# Patient Record
Sex: Male | Born: 1940 | Race: Black or African American | Hispanic: No | Smoking: Never smoker
Health system: Southern US, Community
[De-identification: ages and names within clinical notes are randomized; demographics above are authoritative.]

## PROBLEM LIST (undated history)

## (undated) DIAGNOSIS — E785 Hyperlipidemia, unspecified: Secondary | ICD-10-CM

## (undated) DIAGNOSIS — M810 Age-related osteoporosis without current pathological fracture: Secondary | ICD-10-CM

## (undated) DIAGNOSIS — E119 Type 2 diabetes mellitus without complications: Secondary | ICD-10-CM

## (undated) DIAGNOSIS — N529 Male erectile dysfunction, unspecified: Secondary | ICD-10-CM

## (undated) DIAGNOSIS — C61 Malignant neoplasm of prostate: Secondary | ICD-10-CM

## (undated) DIAGNOSIS — Z87442 Personal history of urinary calculi: Secondary | ICD-10-CM

## (undated) DIAGNOSIS — H9190 Unspecified hearing loss, unspecified ear: Secondary | ICD-10-CM

## (undated) DIAGNOSIS — F0390 Unspecified dementia without behavioral disturbance: Secondary | ICD-10-CM

## (undated) DIAGNOSIS — Z8719 Personal history of other diseases of the digestive system: Secondary | ICD-10-CM

## (undated) DIAGNOSIS — F32A Depression, unspecified: Secondary | ICD-10-CM

---

## 2020-05-27 ENCOUNTER — Ambulatory Visit: Payer: Self-pay | Admitting: Podiatry

## 2020-05-30 ENCOUNTER — Ambulatory Visit: Payer: Self-pay | Admitting: Podiatry

## 2020-06-03 ENCOUNTER — Emergency Department: Payer: Medicare (Managed Care)

## 2020-06-03 ENCOUNTER — Inpatient Hospital Stay: Payer: Medicare (Managed Care)

## 2020-06-03 ENCOUNTER — Inpatient Hospital Stay: Payer: Medicare (Managed Care) | Admitting: Certified Registered"

## 2020-06-03 ENCOUNTER — Encounter: Payer: Self-pay | Admitting: Emergency Medicine

## 2020-06-03 ENCOUNTER — Inpatient Hospital Stay
Admission: EM | Admit: 2020-06-03 | Discharge: 2020-06-20 | DRG: 472 | Disposition: A | Discharge status: Skilled Nursing Facility | Payer: Medicare (Managed Care) | Attending: Hospitalist | Admitting: Hospitalist

## 2020-06-03 ENCOUNTER — Other Ambulatory Visit: Payer: Self-pay

## 2020-06-03 ENCOUNTER — Encounter
Admission: EM | Disposition: A | Discharge status: Skilled Nursing Facility | Payer: Self-pay | Source: Home / Self Care | Attending: Internal Medicine

## 2020-06-03 DIAGNOSIS — F0390 Unspecified dementia without behavioral disturbance: Secondary | ICD-10-CM | POA: Diagnosis present

## 2020-06-03 DIAGNOSIS — S0081XA Abrasion of other part of head, initial encounter: Secondary | ICD-10-CM | POA: Diagnosis present

## 2020-06-03 DIAGNOSIS — Z833 Family history of diabetes mellitus: Secondary | ICD-10-CM

## 2020-06-03 DIAGNOSIS — Y92019 Unspecified place in single-family (private) house as the place of occurrence of the external cause: Secondary | ICD-10-CM

## 2020-06-03 DIAGNOSIS — F05 Delirium due to known physiological condition: Secondary | ICD-10-CM | POA: Diagnosis not present

## 2020-06-03 DIAGNOSIS — F32A Depression, unspecified: Secondary | ICD-10-CM | POA: Diagnosis present

## 2020-06-03 DIAGNOSIS — W19XXXA Unspecified fall, initial encounter: Secondary | ICD-10-CM | POA: Diagnosis not present

## 2020-06-03 DIAGNOSIS — Z8249 Family history of ischemic heart disease and other diseases of the circulatory system: Secondary | ICD-10-CM

## 2020-06-03 DIAGNOSIS — S199XXA Unspecified injury of neck, initial encounter: Secondary | ICD-10-CM | POA: Diagnosis present

## 2020-06-03 DIAGNOSIS — E876 Hypokalemia: Secondary | ICD-10-CM | POA: Diagnosis present

## 2020-06-03 DIAGNOSIS — N400 Enlarged prostate without lower urinary tract symptoms: Secondary | ICD-10-CM | POA: Diagnosis present

## 2020-06-03 DIAGNOSIS — Z419 Encounter for procedure for purposes other than remedying health state, unspecified: Secondary | ICD-10-CM

## 2020-06-03 DIAGNOSIS — Z79899 Other long term (current) drug therapy: Secondary | ICD-10-CM

## 2020-06-03 DIAGNOSIS — H9190 Unspecified hearing loss, unspecified ear: Secondary | ICD-10-CM | POA: Diagnosis present

## 2020-06-03 DIAGNOSIS — M2578 Osteophyte, vertebrae: Secondary | ICD-10-CM | POA: Diagnosis present

## 2020-06-03 DIAGNOSIS — Z794 Long term (current) use of insulin: Secondary | ICD-10-CM

## 2020-06-03 DIAGNOSIS — W109XXA Fall (on) (from) unspecified stairs and steps, initial encounter: Secondary | ICD-10-CM | POA: Diagnosis present

## 2020-06-03 DIAGNOSIS — M7918 Myalgia, other site: Secondary | ICD-10-CM

## 2020-06-03 DIAGNOSIS — Z8546 Personal history of malignant neoplasm of prostate: Secondary | ICD-10-CM | POA: Diagnosis not present

## 2020-06-03 DIAGNOSIS — R200 Anesthesia of skin: Secondary | ICD-10-CM

## 2020-06-03 DIAGNOSIS — F03918 Unspecified dementia, unspecified severity, with other behavioral disturbance: Secondary | ICD-10-CM | POA: Insufficient documentation

## 2020-06-03 DIAGNOSIS — Z20822 Contact with and (suspected) exposure to covid-19: Secondary | ICD-10-CM | POA: Diagnosis present

## 2020-06-03 DIAGNOSIS — S129XXA Fracture of neck, unspecified, initial encounter: Secondary | ICD-10-CM

## 2020-06-03 DIAGNOSIS — S0083XA Contusion of other part of head, initial encounter: Secondary | ICD-10-CM

## 2020-06-03 DIAGNOSIS — E119 Type 2 diabetes mellitus without complications: Secondary | ICD-10-CM

## 2020-06-03 DIAGNOSIS — M810 Age-related osteoporosis without current pathological fracture: Secondary | ICD-10-CM | POA: Diagnosis present

## 2020-06-03 DIAGNOSIS — E1165 Type 2 diabetes mellitus with hyperglycemia: Secondary | ICD-10-CM | POA: Diagnosis not present

## 2020-06-03 DIAGNOSIS — R410 Disorientation, unspecified: Secondary | ICD-10-CM | POA: Diagnosis not present

## 2020-06-03 DIAGNOSIS — E785 Hyperlipidemia, unspecified: Secondary | ICD-10-CM

## 2020-06-03 DIAGNOSIS — S12501A Unspecified nondisplaced fracture of sixth cervical vertebra, initial encounter for closed fracture: Secondary | ICD-10-CM | POA: Diagnosis not present

## 2020-06-03 DIAGNOSIS — S12500A Unspecified displaced fracture of sixth cervical vertebra, initial encounter for closed fracture: Principal | ICD-10-CM | POA: Diagnosis present

## 2020-06-03 DIAGNOSIS — Z981 Arthrodesis status: Secondary | ICD-10-CM

## 2020-06-03 DIAGNOSIS — F0391 Unspecified dementia with behavioral disturbance: Secondary | ICD-10-CM | POA: Insufficient documentation

## 2020-06-03 HISTORY — DX: Personal history of urinary calculi: Z87.442

## 2020-06-03 HISTORY — DX: Hyperlipidemia, unspecified: E78.5

## 2020-06-03 HISTORY — DX: Depression, unspecified: F32.A

## 2020-06-03 HISTORY — DX: Unspecified dementia, unspecified severity, without behavioral disturbance, psychotic disturbance, mood disturbance, and anxiety: F03.90

## 2020-06-03 HISTORY — DX: Personal history of other diseases of the digestive system: Z87.19

## 2020-06-03 HISTORY — DX: Malignant neoplasm of prostate: C61

## 2020-06-03 HISTORY — PX: ANTERIOR CERVICAL DECOMP/DISCECTOMY FUSION: SHX1161

## 2020-06-03 HISTORY — DX: Type 2 diabetes mellitus without complications: E11.9

## 2020-06-03 HISTORY — DX: Male erectile dysfunction, unspecified: N52.9

## 2020-06-03 HISTORY — DX: Age-related osteoporosis without current pathological fracture: M81.0

## 2020-06-03 HISTORY — DX: Unspecified hearing loss, unspecified ear: H91.90

## 2020-06-03 LAB — CBC
HCT: 42.2 % (ref 39.0–52.0)
Hemoglobin: 14.1 g/dL (ref 13.0–17.0)
MCH: 29.4 pg (ref 26.0–34.0)
MCHC: 33.4 g/dL (ref 30.0–36.0)
MCV: 88.1 fL (ref 80.0–100.0)
Platelets: 158 10*3/uL (ref 150–400)
RBC: 4.79 MIL/uL (ref 4.22–5.81)
RDW: 12.9 % (ref 11.5–15.5)
WBC: 11.1 10*3/uL — ABNORMAL HIGH (ref 4.0–10.5)
nRBC: 0 % (ref 0.0–0.2)

## 2020-06-03 LAB — BASIC METABOLIC PANEL
Anion gap: 14 (ref 5–15)
Anion gap: 14 (ref 5–15)
BUN: 27 mg/dL — ABNORMAL HIGH (ref 8–23)
BUN: 15 mg/dL (ref 8–23)
CO2: 20 mmol/L — ABNORMAL LOW (ref 22–32)
CO2: 19 mmol/L — ABNORMAL LOW (ref 22–32)
Calcium: 9 mg/dL (ref 8.9–10.3)
Calcium: 8.2 mg/dL — ABNORMAL LOW (ref 8.9–10.3)
Chloride: 105 mmol/L (ref 98–111)
Chloride: 105 mmol/L (ref 98–111)
Creatinine, Ser: 1.17 mg/dL (ref 0.61–1.24)
Creatinine, Ser: 0.85 mg/dL (ref 0.61–1.24)
GFR, Estimated: 59 mL/min — ABNORMAL LOW (ref >60)
GFR, Estimated: >60 mL/min (ref >60)
Glucose, Bld: 148 mg/dL — ABNORMAL HIGH (ref 70–99)
Glucose, Bld: 151 mg/dL — ABNORMAL HIGH (ref 70–99)
Potassium: 3.4 mmol/L — ABNORMAL LOW (ref 3.5–5.1)
Potassium: 3.8 mmol/L (ref 3.5–5.1)
Sodium: 139 mmol/L (ref 135–145)
Sodium: 138 mmol/L (ref 135–145)

## 2020-06-03 LAB — BLOOD GAS, ARTERIAL
Acid-base deficit: 4.8 mmol/L — ABNORMAL HIGH (ref 0.0–2.0)
Bicarbonate: 19.7 mmol/L — ABNORMAL LOW (ref 20.0–28.0)
FIO2: 0.28
O2 Saturation: 97.9 %
Patient temperature: 37
pCO2 arterial: 34 mmHg (ref 32.0–48.0)
pH, Arterial: 7.37 (ref 7.350–7.450)
pO2, Arterial: 105 mmHg (ref 83.0–108.0)

## 2020-06-03 LAB — CBC WITH DIFFERENTIAL/PLATELET
Abs Immature Granulocytes: 0.09 10*3/uL — ABNORMAL HIGH (ref 0.00–0.07)
Basophils Absolute: 0 10*3/uL (ref 0.0–0.1)
Basophils Relative: 0 %
Eosinophils Absolute: 0 10*3/uL (ref 0.0–0.5)
Eosinophils Relative: 0 %
HCT: 43.9 % (ref 39.0–52.0)
Hemoglobin: 14.7 g/dL (ref 13.0–17.0)
Immature Granulocytes: 1 %
Lymphocytes Relative: 8 %
Lymphs Abs: 1 10*3/uL (ref 0.7–4.0)
MCH: 29.4 pg (ref 26.0–34.0)
MCHC: 33.5 g/dL (ref 30.0–36.0)
MCV: 87.8 fL (ref 80.0–100.0)
Monocytes Absolute: 0.6 10*3/uL (ref 0.1–1.0)
Monocytes Relative: 5 %
Neutro Abs: 10.4 10*3/uL — ABNORMAL HIGH (ref 1.7–7.7)
Neutrophils Relative %: 86 %
Platelets: 138 10*3/uL — ABNORMAL LOW (ref 150–400)
RBC: 5 MIL/uL (ref 4.22–5.81)
RDW: 12.9 % (ref 11.5–15.5)
WBC: 12.1 10*3/uL — ABNORMAL HIGH (ref 4.0–10.5)
nRBC: 0 % (ref 0.0–0.2)

## 2020-06-03 LAB — PROTIME-INR
INR: 1 (ref 0.8–1.2)
Prothrombin Time: 12.7 seconds (ref 11.4–15.2)

## 2020-06-03 LAB — GLUCOSE, CAPILLARY
Glucose-Capillary: 146 mg/dL — ABNORMAL HIGH (ref 70–99)
Glucose-Capillary: 113 mg/dL — ABNORMAL HIGH (ref 70–99)
Glucose-Capillary: 124 mg/dL — ABNORMAL HIGH (ref 70–99)

## 2020-06-03 LAB — RESPIRATORY PANEL BY RT PCR (FLU A&B, COVID)
Influenza A by PCR: NEGATIVE
Influenza B by PCR: NEGATIVE
SARS Coronavirus 2 by RT PCR: NEGATIVE

## 2020-06-03 SURGERY — ANTERIOR CERVICAL DECOMPRESSION/DISCECTOMY FUSION 1 LEVEL
Anesthesia: General

## 2020-06-03 MED ORDER — ONDANSETRON HCL 4 MG PO TABS: 4.0000 mg | ORAL_TABLET | Freq: Four times a day (QID) | ORAL | Status: DC | PRN

## 2020-06-03 MED ORDER — FENTANYL CITRATE (PF) 100 MCG/2ML IJ SOLN: 25.0000 ug | INTRAMUSCULAR | Status: DC | PRN

## 2020-06-03 MED ORDER — MORPHINE SULFATE (PF) 2 MG/ML IV SOLN
2.0000 mg | INTRAVENOUS | Status: DC | PRN
  Filled 2020-06-03: qty 1

## 2020-06-03 MED ORDER — OXYCODONE HCL 5 MG/5ML PO SOLN: 5.0000 mg | Freq: Once | ORAL | Status: DC | PRN

## 2020-06-03 MED ORDER — QUETIAPINE FUMARATE 25 MG PO TABS
25.0000 mg | ORAL_TABLET | Freq: Every day | ORAL | Status: DC
  Administered 2020-06-04 – 2020-06-19 (×16): 25 mg via ORAL
  Filled 2020-06-03 (×16): qty 1

## 2020-06-03 MED ORDER — CHLORHEXIDINE GLUCONATE CLOTH 2 % EX PADS
6.0000 | MEDICATED_PAD | Freq: Every day | CUTANEOUS | Status: DC
  Administered 2020-06-06 – 2020-06-20 (×10): 6 via TOPICAL

## 2020-06-03 MED ORDER — TRAZODONE HCL 50 MG PO TABS
25.0000 mg | ORAL_TABLET | Freq: Every evening | ORAL | Status: DC | PRN
  Administered 2020-06-04: 25 mg via ORAL
  Filled 2020-06-03: qty 1

## 2020-06-03 MED ORDER — LIDOCAINE HCL (CARDIAC) PF 100 MG/5ML IV SOSY
PREFILLED_SYRINGE | INTRAVENOUS | Status: DC | PRN
  Administered 2020-06-03: 60 mg via INTRAVENOUS

## 2020-06-03 MED ORDER — SODIUM CHLORIDE 0.9 % IV SOLN
INTRAVENOUS | Status: DC | PRN
  Administered 2020-06-03: 50 ug/min via INTRAVENOUS

## 2020-06-03 MED ORDER — HYDROMORPHONE HCL 1 MG/ML IJ SOLN: 0.5000 mg | INTRAMUSCULAR | Status: DC | PRN

## 2020-06-03 MED ORDER — PROPOFOL 500 MG/50ML IV EMUL
INTRAVENOUS | Status: DC | PRN
  Administered 2020-06-03: 50 ug/kg/min via INTRAVENOUS

## 2020-06-03 MED ORDER — PRAVASTATIN SODIUM 20 MG PO TABS
20.0000 mg | ORAL_TABLET | Freq: Every day | ORAL | Status: DC
  Administered 2020-06-04 – 2020-06-19 (×15): 20 mg via ORAL
  Filled 2020-06-03 (×16): qty 1

## 2020-06-03 MED ORDER — LIDOCAINE-EPINEPHRINE 1 %-1:100000 IJ SOLN
INTRAMUSCULAR | Status: DC | PRN
  Administered 2020-06-03: 4 mL

## 2020-06-03 MED ORDER — CEFAZOLIN SODIUM-DEXTROSE 2-3 GM-%(50ML) IV SOLR
INTRAVENOUS | Status: DC | PRN
  Administered 2020-06-03: 2 g via INTRAVENOUS

## 2020-06-03 MED ORDER — FENTANYL CITRATE (PF) 100 MCG/2ML IJ SOLN
INTRAMUSCULAR | Status: DC | PRN
  Administered 2020-06-03 (×2): 25 ug via INTRAVENOUS
  Administered 2020-06-03: 50 ug via INTRAVENOUS

## 2020-06-03 MED ORDER — SUCCINYLCHOLINE CHLORIDE 20 MG/ML IJ SOLN
INTRAMUSCULAR | Status: DC | PRN
  Administered 2020-06-03: 100 mg via INTRAVENOUS

## 2020-06-03 MED ORDER — PROPOFOL 10 MG/ML IV BOLUS
INTRAVENOUS | Status: DC | PRN
  Administered 2020-06-03: 70 mg via INTRAVENOUS

## 2020-06-03 MED ORDER — MAGNESIUM HYDROXIDE 400 MG/5ML PO SUSP
30.0000 mL | Freq: Every day | ORAL | Status: DC | PRN
  Administered 2020-06-12: 30 mL via ORAL
  Filled 2020-06-03 (×2): qty 30

## 2020-06-03 MED ORDER — ACETAMINOPHEN 650 MG RE SUPP: 650.0000 mg | Freq: Four times a day (QID) | RECTAL | Status: DC | PRN

## 2020-06-03 MED ORDER — METHOCARBAMOL 1000 MG/10ML IJ SOLN
500.0000 mg | Freq: Three times a day (TID) | INTRAVENOUS | Status: DC | PRN
  Administered 2020-06-04: 500 mg via INTRAVENOUS
  Filled 2020-06-03 (×2): qty 5

## 2020-06-03 MED ORDER — TAMSULOSIN HCL 0.4 MG PO CAPS
0.4000 mg | ORAL_CAPSULE | Freq: Every day | ORAL | Status: DC
  Administered 2020-06-04: 0.4 mg via ORAL
  Filled 2020-06-03: qty 1

## 2020-06-03 MED ORDER — REMIFENTANIL HCL 1 MG IV SOLR
INTRAVENOUS | Status: AC
  Filled 2020-06-03: qty 1000

## 2020-06-03 MED ORDER — NALOXONE HCL 2 MG/2ML IJ SOSY: 0.4000 mg | PREFILLED_SYRINGE | Freq: Once | INTRAMUSCULAR | Status: AC

## 2020-06-03 MED ORDER — OXYCODONE-ACETAMINOPHEN 5-325 MG PO TABS: 1.0000 | ORAL_TABLET | Freq: Once | ORAL | Status: DC

## 2020-06-03 MED ORDER — ONDANSETRON HCL 4 MG/2ML IJ SOLN
4.0000 mg | Freq: Four times a day (QID) | INTRAMUSCULAR | Status: DC | PRN
  Administered 2020-06-03: 4 mg via INTRAVENOUS
  Filled 2020-06-03: qty 2

## 2020-06-03 MED ORDER — ONDANSETRON HCL 4 MG/2ML IJ SOLN: 4.0000 mg | Freq: Once | INTRAMUSCULAR | Status: DC | PRN

## 2020-06-03 MED ORDER — INSULIN GLARGINE 100 UNIT/ML ~~LOC~~ SOLN
7.0000 [IU] | Freq: Every day | SUBCUTANEOUS | Status: DC
  Administered 2020-06-03 – 2020-06-06 (×4): 7 [IU] via SUBCUTANEOUS
  Filled 2020-06-03 (×6): qty 0.07

## 2020-06-03 MED ORDER — OXYCODONE HCL 5 MG PO TABS: 5.0000 mg | ORAL_TABLET | Freq: Once | ORAL | Status: DC | PRN

## 2020-06-03 MED ORDER — INSULIN ASPART 100 UNIT/ML ~~LOC~~ SOLN
0.0000 [IU] | SUBCUTANEOUS | Status: DC
  Administered 2020-06-04: 2 [IU] via SUBCUTANEOUS
  Administered 2020-06-04: 3 [IU] via SUBCUTANEOUS
  Administered 2020-06-04: 2 [IU] via SUBCUTANEOUS
  Administered 2020-06-05: 5 [IU] via SUBCUTANEOUS
  Administered 2020-06-05: 2 [IU] via SUBCUTANEOUS
  Administered 2020-06-05 (×2): 3 [IU] via SUBCUTANEOUS
  Administered 2020-06-06: 2 [IU] via SUBCUTANEOUS
  Administered 2020-06-06: 1 [IU] via SUBCUTANEOUS
  Administered 2020-06-06: 2 [IU] via SUBCUTANEOUS
  Administered 2020-06-06: 9 [IU] via SUBCUTANEOUS
  Filled 2020-06-03 (×9): qty 1

## 2020-06-03 MED ORDER — PROPOFOL 10 MG/ML IV BOLUS
INTRAVENOUS | Status: AC
  Filled 2020-06-03: qty 20

## 2020-06-03 MED ORDER — REMIFENTANIL HCL 1 MG IV SOLR
INTRAVENOUS | Status: DC | PRN
  Administered 2020-06-03: .07 ug/kg/min via INTRAVENOUS

## 2020-06-03 MED ORDER — ENOXAPARIN SODIUM 40 MG/0.4ML ~~LOC~~ SOLN
40.0000 mg | SUBCUTANEOUS | Status: DC
  Administered 2020-06-04 – 2020-06-19 (×16): 40 mg via SUBCUTANEOUS
  Filled 2020-06-03 (×17): qty 0.4

## 2020-06-03 MED ORDER — GELATIN ABSORBABLE 12-7 MM EX MISC
CUTANEOUS | Status: DC | PRN
  Administered 2020-06-03 (×2): 1

## 2020-06-03 MED ORDER — FENTANYL CITRATE (PF) 100 MCG/2ML IJ SOLN
INTRAMUSCULAR | Status: AC
  Filled 2020-06-03: qty 2

## 2020-06-03 MED ORDER — PROPOFOL 500 MG/50ML IV EMUL
INTRAVENOUS | Status: AC
  Filled 2020-06-03: qty 50

## 2020-06-03 MED ORDER — THROMBIN 5000 UNITS EX SOLR
CUTANEOUS | Status: DC | PRN
  Administered 2020-06-03: 5000 [IU] via TOPICAL

## 2020-06-03 MED ORDER — ACETAMINOPHEN 10 MG/ML IV SOLN
INTRAVENOUS | Status: AC
  Administered 2020-06-03: 1000 mg via INTRAVENOUS
  Filled 2020-06-03: qty 100

## 2020-06-03 MED ORDER — ACETAMINOPHEN 10 MG/ML IV SOLN
1000.0000 mg | Freq: Three times a day (TID) | INTRAVENOUS | Status: AC
  Administered 2020-06-04 (×2): 1000 mg via INTRAVENOUS
  Filled 2020-06-03 (×2): qty 100

## 2020-06-03 MED ORDER — DEXAMETHASONE SODIUM PHOSPHATE 10 MG/ML IJ SOLN
INTRAMUSCULAR | Status: DC | PRN
  Administered 2020-06-03: 10 mg via INTRAVENOUS

## 2020-06-03 MED ORDER — ACETAMINOPHEN 325 MG PO TABS: 650.0000 mg | ORAL_TABLET | Freq: Four times a day (QID) | ORAL | Status: DC | PRN

## 2020-06-03 MED ORDER — DROPERIDOL 2.5 MG/ML IJ SOLN
5.0000 mg | Freq: Once | INTRAMUSCULAR | Status: AC
  Administered 2020-06-03: 5 mg via INTRAVENOUS
  Filled 2020-06-03: qty 2

## 2020-06-03 MED ORDER — SODIUM CHLORIDE 0.9 % IV SOLN: INTRAVENOUS | Status: DC

## 2020-06-03 MED ORDER — NALOXONE HCL 2 MG/2ML IJ SOSY
PREFILLED_SYRINGE | INTRAMUSCULAR | Status: AC
  Administered 2020-06-03: 0.4 mg via INTRAVENOUS
  Filled 2020-06-03: qty 2

## 2020-06-03 SURGICAL SUPPLY — 64 items
ALLOGRAFT LORDOTIC CC 7X11X14 (Bone Implant) ×2 IMPLANT
BIT DRILL 13 (BIT) IMPLANT
BIT DRILL 13MM (BIT)
BIT DRILL SPINE QC 12 (BIT) ×2 IMPLANT
BLADE BOVIE TIP EXT 4 (BLADE) ×3 IMPLANT
BLADE SURG 15 STRL LF DISP TIS (BLADE) ×1 IMPLANT
BLADE SURG 15 STRL SS (BLADE) ×3
BUR DIAMOND COARSE 4.0 RND (BURR) ×1 IMPLANT
BUR NEURO DRILL SOFT 3.0X3.8M (BURR) ×3 IMPLANT
CANISTER SUCT 1200ML W/VALVE (MISCELLANEOUS) ×3 IMPLANT
CHLORAPREP W/TINT 26 (MISCELLANEOUS) ×6 IMPLANT
COUNTER NEEDLE 20/40 LG (NEEDLE) ×3 IMPLANT
COVER LIGHT HANDLE STERIS (MISCELLANEOUS) ×6 IMPLANT
COVER WAND RF STERILE (DRAPES) ×3 IMPLANT
CUP MEDICINE 2OZ PLAST GRAD ST (MISCELLANEOUS) ×6 IMPLANT
DERMABOND ADVANCED (GAUZE/BANDAGES/DRESSINGS) ×2
DERMABOND ADVANCED .7 DNX12 (GAUZE/BANDAGES/DRESSINGS) ×1 IMPLANT
DRAPE C-ARM 42X72 X-RAY (DRAPES) ×6 IMPLANT
DRAPE MICROSCOPE SPINE 48X150 (DRAPES) ×3 IMPLANT
DRAPE SURG 17X11 SM STRL (DRAPES) ×6 IMPLANT
DRAPE THYROID T SHEET (DRAPES) ×3 IMPLANT
ELECT CAUTERY BLADE TIP 2.5 (TIP) ×3
ELECT EZSTD 165MM 6.5IN (MISCELLANEOUS) ×3
ELECTRODE CAUTERY BLDE TIP 2.5 (TIP) ×1 IMPLANT
ELECTRODE EZSTD 165MM 6.5IN (MISCELLANEOUS) ×1 IMPLANT
FEE INTRAOP MONITOR IMPULS NCS (MISCELLANEOUS) IMPLANT
GAUZE SPONGE 4X4 12PLY STRL (GAUZE/BANDAGES/DRESSINGS) ×3 IMPLANT
GLOVE BIOGEL PI IND STRL 8 (GLOVE) ×1 IMPLANT
GLOVE BIOGEL PI INDICATOR 8 (GLOVE) ×2
GLOVE INDICATOR 7.0 STRL GRN (GLOVE) ×3 IMPLANT
GLOVE SURG SYN 7.0 (GLOVE) ×6 IMPLANT
GLOVE SURG SYN 7.0 PF PI (GLOVE) ×2 IMPLANT
GLOVE SURG SYN 8.0 (GLOVE) ×6 IMPLANT
GLOVE SURG SYN 8.0 PF PI (GLOVE) ×2 IMPLANT
GOWN STRL REUS W/ TWL XL LVL3 (GOWN DISPOSABLE) ×2 IMPLANT
GOWN STRL REUS W/TWL XL LVL3 (GOWN DISPOSABLE) ×6
GRADUATE 1200CC STRL 31836 (MISCELLANEOUS) ×3 IMPLANT
INTRAOP MONITOR FEE IMPULS NCS (MISCELLANEOUS) ×1
INTRAOP MONITOR FEE IMPULSE (MISCELLANEOUS) ×3
IV CATH ANGIO 12GX3 LT BLUE (NEEDLE) ×1 IMPLANT
KIT TURNOVER KIT A (KITS) ×3 IMPLANT
MARKER SKIN DUAL TIP RULER LAB (MISCELLANEOUS) ×6 IMPLANT
NDL SPNL 22GX3.5 QUINCKE BK (NEEDLE) ×1 IMPLANT
NEEDLE HYPO 22GX1.5 SAFETY (NEEDLE) ×3 IMPLANT
NEEDLE SPNL 22GX3.5 QUINCKE BK (NEEDLE) ×3 IMPLANT
NS IRRIG 1000ML POUR BTL (IV SOLUTION) ×3 IMPLANT
PACK LAMINECTOMY NEURO (CUSTOM PROCEDURE TRAY) ×3 IMPLANT
PAD ARMBOARD 7.5X6 YLW CONV (MISCELLANEOUS) ×3 IMPLANT
PIN CASPAR 14 (PIN) ×1 IMPLANT
PIN CASPAR 14MM (PIN) ×3 IMPLANT
PLATE ANT CERV XTEND 1 LV 14 (Plate) ×2 IMPLANT
SCREW XTD VAR 4.2 SELF TAP (Screw) ×8 IMPLANT
SPOGE SURGIFLO 8M (HEMOSTASIS) ×3
SPONGE KITTNER 5P (MISCELLANEOUS) ×3 IMPLANT
SPONGE SURGIFLO 8M (HEMOSTASIS) ×1 IMPLANT
SUT POLYSORB 2-0 5X18 GS-10 (SUTURE) ×6 IMPLANT
SUT VICRYL 2-0 SH 8X27 (SUTURE) ×3 IMPLANT
SUT VICRYL 3-0 CR8 SH (SUTURE) ×3 IMPLANT
SYR 30ML LL (SYRINGE) ×3 IMPLANT
TAPE CLOTH 3X10 WHT NS LF (GAUZE/BANDAGES/DRESSINGS) ×3 IMPLANT
TOWEL OR 17X26 4PK STRL BLUE (TOWEL DISPOSABLE) ×6 IMPLANT
TRAY FOLEY MTR SLVR 16FR STAT (SET/KITS/TRAYS/PACK) ×2 IMPLANT
TUBING CONNECTING 10 (TUBING) ×2 IMPLANT
TUBING CONNECTING 10' (TUBING) ×1

## 2020-06-03 NOTE — Interval H&P Note (Signed)
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  The various methods of treatment have been discussed with the patient and family. After consideration of risks, benefits and other options for treatment, the patient has consented to  Procedure(s): ANTERIOR CERVICAL DECOMPRESSION/DISCECTOMY FUSION 1 LEVEL C6/7 (N/A) as a surgical intervention.  The patient's history has been reviewed, patient examined, no change in status, stable for surgery.  I have reviewed the patient's chart and labs.  Questions were answered to the patient's satisfaction.     Mitchell Stewart

## 2020-06-03 NOTE — ED Notes (Signed)
Patient from home.  Patient states he slipped and fell.  Patient reports having numbness to his left thumb and part of his left hand.

## 2020-06-03 NOTE — Progress Notes (Signed)
Procedure: ACDF C6-7 Procedure date: 06/03/2020 Diagnosis: cervical vertebral fracture    History: Mitchell Stewart is s/p ACDF C6-7 POD0: Tolerated procedure well. Awaiting pt to wake up in PACU Physical Exam: Vitals:   06/03/20 0120 06/03/20 1643  BP: (!) 158/86 (!) 149/85  Pulse: 73 88  Resp: 18 16  Temp: 98.1 F (36.7 C) 97.6 F (36.4 C)  SpO2: 100% 99%    General: Drowsy.   Strength:Moving all extremities at least antigravity in PACU Sensation: UTA Skin: neck incision c/d/i  Data:  Recent Labs  Lab 06/03/20 0320  NA 139  K 3.4*  CL 105  CO2 20*  BUN 27*  CREATININE 1.17  GLUCOSE 148*  CALCIUM 9.0   No results for input(s): AST, ALT, ALKPHOS in the last 168 hours.  Invalid input(s): TBILI   Recent Labs  Lab 06/03/20 0320  WBC 12.1*  HGB 14.7  HCT 43.9  PLT 138*   Recent Labs  Lab 06/03/20 0320  INR 1.0         Assessment/Plan:  Mitchell Stewart is POD0 s/p ACDF C6-7.   He is slow to emerge from anesthesia, we will give him 0.4mg  Narcan to see if he becomes more alert and able to follow commands. 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  #ACDF C6-7 - mobilize - pain control - PTOT - Brace - Imaging  #DVT Prophylaxis - DVT prophylaxis w/ Lovenox on POD1 at [place] management by primary team.    Lonell Face, NP Department of Neurosurgery

## 2020-06-03 NOTE — Anesthesia Procedure Notes (Signed)
Procedure Name: Intubation Date/Time: 06/03/2020 5:50 PM Performed by: Nelda Marseille, CRNA Pre-anesthesia Checklist: Patient identified, Patient being monitored, Timeout performed, Emergency Drugs available and Suction available Patient Re-evaluated:Patient Re-evaluated prior to induction Oxygen Delivery Method: Circle system utilized Preoxygenation: Pre-oxygenation with 100% oxygen Induction Type: IV induction Ventilation: Mask ventilation without difficulty Laryngoscope Size: Mac, 3 and McGraph Grade View: Grade II Tube type: Oral Tube size: 7.5 mm Number of attempts: 1 Airway Equipment and Method: Stylet and Video-laryngoscopy Placement Confirmation: ETT inserted through vocal cords under direct vision,  positive ETCO2 and breath sounds checked- equal and bilateral Secured at: 22 cm Tube secured with: Tape Dental Injury: Teeth and Oropharynx as per pre-operative assessment  Comments: Head and c spine stabiluization per standard of care

## 2020-06-03 NOTE — ED Provider Notes (Signed)
[facility] Emergency Department Provider Note  ____________________________________________   First MD Initiated Contact with Patient 06/03/20 0257     (approximate)  I have reviewed the triage vital signs and the nursing notes.   HISTORY  Chief Complaint Fall  Level 5 caveat:  history/ROS limited by chronic dementia  HPI Mitchell Stewart is a 79 y.o. male with medical history as listed below which notably includes dementia and who lives at home with his family.  He presents by EMS after a fall.  Reportedly the patient got up to go the bathroom and for some reason fell down a flight of stairs, landing on his back on the concrete but also having struck his forehead which has an abrasion.  He is reporting pain in his neck, both shoulders, and both elbows.  He is also reporting numbness in his left thumb.  He denies any weakness.  He is unclear on the details of the injury but he denies chest pain, shortness of breath, nausea, vomiting, and abdominal pain.  He said he felt fine before the fall.  He does not believe he lost consciousness.  The onset was acute and the symptoms and mechanism of injury were severe.  He was placed in a c-collar by the paramedics which she has in place upon arrival.  I saw him in triage and performed a medical screening exam including ordering imaging.         Past Medical History:  Diagnosis Date  . Dementia ([place])   . Depression   . Diabetes mellitus without complication ([place])   . Erectile dysfunction   . Hard of hearing   . History of diverticulitis   . History of kidney stones   . Hyperlipidemia   . Osteoporosis   . Prostate cancer ([place])     There are no problems to display for this patient.   History reviewed. No pertinent surgical history.  Prior to Admission medications   Not on File    Allergies Patient has no known allergies.  History reviewed. No pertinent family history.  Social History Social History    Tobacco Use  . Smoking status: Never Smoker  . Smokeless tobacco: Never Used  Substance Use Topics  . Alcohol use: Not on file  . 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  Patient reports pain in his neck, shoulders, and elbows after a fall, as well as numbness in his left thumb.  Denies focal weakness.  ____________________________________________   PHYSICAL EXAM:  VITAL SIGNS: ED Triage Vitals  Enc Vitals Group     BP 06/03/20 0120 (!) 158/86     Pulse Rate 06/03/20 0120 73     Resp 06/03/20 0120 18     Temp 06/03/20 0120 98.1 F (36.7 C)     Temp Source 06/03/20 0120 Oral     SpO2 06/03/20 0120 100 %     Weight 06/03/20 0121 56.7 kg (125 lb)     Height 06/03/20 0121 1.676 m (5\' 6" )     Head Circumference --      Peak Flow --      Pain Score 06/03/20 0121 9     Pain Loc --      Pain Edu? --      Excl. in [place]? --     Constitutional: Alert and oriented to self and location. Eyes: Conjunctivae are normal.  Pupils are equal and  reactive. Head: Contusion and abrasion to right forehead consistent with history of fall. Nose: No congestion/rhinnorhea. Mouth/Throat: Patient is wearing a mask. Neck: No stridor.  No meningeal signs.  C-collar is in place.  Palpating through the c-collar results and tenderness to palpation of the base of the cervical spine. Cardiovascular: Normal rate, regular rhythm. Good peripheral circulation. Grossly normal heart sounds. Respiratory: Normal respiratory effort.  No retractions. Gastrointestinal: Soft and nontender. No distention.  Musculoskeletal: Patient has some pain with range of motion of both of his arms but said actually feels better after he moves them around.  Normal flexion and extension of his elbows, able to reach across his body with both of his shoulders.  No gross deformities identified including no obvious clavicular injuries. Neurologic:  Normal speech and language.  No gross focal neurologic deficits are appreciated but the patient reports numbness of his left thumb.  He has no weakness in any of his extremities at this time. Skin:  Skin is warm, dry and intact except for the abrasion to his forehead previously described.   ____________________________________________   LABS (all labs ordered are listed, but only abnormal results are displayed)  Labs Reviewed  RESPIRATORY PANEL BY RT PCR (FLU A&B, COVID)  CBC WITH DIFFERENTIAL/PLATELET  BASIC METABOLIC PANEL  PROTIME-INR   ____________________________________________  EKG  ED ECG REPORT I, Hinda Kehr, the attending physician, personally viewed and interpreted this ECG.  Date: 06/03/2020 EKG Time: 4:03 AM Rate: 91 Rhythm: normal sinus rhythm QRS Axis: normal Intervals: normal ST/T Wave abnormalities: normal Narrative Interpretation: no evidence of acute ischemia  ____________________________________________  RADIOLOGY I, Hinda Kehr, personally viewed and evaluated these images (plain radiographs) as part of my medical decision making, as well as reviewing the written report by the radiologist.  I also discussed the case with radiology by phone.  ED MD interpretation: Soft tissue swelling of the left elbow, no bony abnormalities identified in the shoulders nor elbows.  Head CT shows no evidence of acute intracranial injury.  Cervical spine has acute fractures of C6 and C7. MRI of the cervical spine redemonstrates the bony injuries but it does not indicate an obvious signal abnormality of the cord itself.  Official radiology report(s): DG Shoulder Right  Result Date: 06/03/2020 CLINICAL DATA:  Pain status post fall EXAM: RIGHT SHOULDER - 2+ VIEW COMPARISON:  None. FINDINGS: There is no evidence of fracture or dislocation. There is no evidence of arthropathy or other focal bone abnormality. Soft tissues are unremarkable. IMPRESSION: Negative. Electronically Signed   By: Constance Holster M.D.   On: 06/03/2020 02:54   DG Elbow Complete Left  Result Date: 06/03/2020 CLINICAL DATA:  Pain EXAM: LEFT ELBOW - COMPLETE 3+ VIEW COMPARISON:  None. FINDINGS: There is soft tissue swelling about the elbow. Degenerative changes are noted. There is no joint effusion. IMPRESSION: Soft tissue swelling without evidence of an acute displaced fracture. Electronically Signed   By: Constance Holster M.D.   On: 06/03/2020 02:57   DG Elbow Complete Right  Result Date: 06/03/2020 CLINICAL DATA:  Pain EXAM: RIGHT ELBOW - COMPLETE 3+ VIEW COMPARISON:  None. FINDINGS: There is no evidence of fracture, dislocation, or joint effusion. There is no evidence of arthropathy or other focal bone abnormality. Soft tissues are unremarkable. IMPRESSION: Negative. Electronically Signed   By: Constance Holster M.D.   On: 06/03/2020 02:58   DG Abdomen 1 View  Result Date: 06/03/2020 CLINICAL DATA:  Foreign body evaluation for MRI. EXAM: ABDOMEN - 1 VIEW  COMPARISON:  None. FINDINGS: No radiopaque foreign body is identified. Gas is present in nondilated loops of small and large bowel without evidence of obstruction. No acute osseous abnormality is seen. IMPRESSION: No radiopaque foreign body identified. Electronically Signed   By: Logan Bores M.D.   On: 06/03/2020 04:33   CT Head Wo Contrast  Result Date: 06/03/2020 CLINICAL DATA:  Fall downstairs, head injury, headache EXAM: CT HEAD WITHOUT CONTRAST CT CERVICAL SPINE WITHOUT CONTRAST TECHNIQUE: Multidetector CT imaging of the head and cervical spine was performed following the standard protocol without intravenous contrast. Multiplanar CT image reconstructions of the cervical spine were also generated. COMPARISON:  None. FINDINGS: CT HEAD FINDINGS Brain: Normal anatomic configuration of the brain. Mild parenchymal volume loss is commensurate with the patient's age. There are moderate to severe periventricular and subcortical white matter changes present likely  reflecting the sequela of small vessel ischemia. 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 No acute intracranial hemorrhage or infarct. No abnormal mass effect or midline shift. No abnormal intra or extra-axial mass lesion or fluid collection. Ventricular size is normal. Cerebellum is unremarkable. Vascular: No asymmetric hyperdense vasculature at the skull base. Skull: Intact Sinuses/Orbits: The paranasal sinuses are clear. The orbits are unremarkable. Other: Mastoid air cells and middle ear cavities are clear. CT CERVICAL SPINE FINDINGS Alignment: There is 2 mm anterolisthesis of C6 upon C7, likely posttraumatic in nature. Minimal anterolisthesis of C4 upon C5 is likely physiologic. Skull base and vertebrae: The craniocervical junction is unremarkable. Atlantal dental interval is normal. There are acute fractures involving the left lamina and spinous process of C6. There is a fracture involving the left pedicle of C6 as well as the the superior articular facet of C7 extending into the left C6-7 facet with mild joint space widening noted. Finally, a fracture extends through the pedicle of C6 on the right. Soft tissues and spinal canal: A small epidural hematoma measuring 3 mm x 10 mm is seen superficial to the C6 laminar fracture demonstrating minimal effacement of the canal a smaller epidural hematoma is seen superiorly posterior to the thecal sac at C5-6. No paraspinal fluid collection is identified. There is subcutaneous infiltration within the posterior soft tissues superficial to the spinous processes likely representing edema. Disc levels: Review of the sagittal reformats demonstrates preservation of vertebral body height. There is intervertebral disc space narrowing and endplate remodeling at L2-G4 in keeping with changes of moderate degenerative disc disease. 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 Review of the axial images confirms the above-mentioned fractures. There is  multilevel uncovertebral and facet arthrosis resulting in multilevel mild neural foraminal narrowing. 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 Upper chest: Unremarkable Other: None significant IMPRESSION: No acute intracranial injury.  No calvarial fracture. Acute fracture of the cervical spine predominantly involving the posterior elements of C6 with fracture bilateral pedicles and minimal anterolisthesis of C6 upon C7. The fracture pattern is suggestive of a hyperextension injury. Fractures also are identified involving the spinous process of C6, the left lamina of C6, and the left C7 superior articular facet extending into the left C6-7 facet which demonstrates mild relative widening. 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 Superimposed small epidural hematoma at the level of the fracture and at C5-6 slightly superiorly. These results will be called to the ordering clinician or representative by the Radiologist Assistant, and communication documented in the PACS or [employer]. Electronically Signed   By: Fidela Salisbury MD  On: 06/03/2020 02:26   CT Cervical Spine Wo Contrast  Result Date: 06/03/2020 CLINICAL DATA:  Fall downstairs, head injury, headache EXAM: CT HEAD WITHOUT CONTRAST CT CERVICAL SPINE WITHOUT CONTRAST TECHNIQUE: Multidetector CT imaging of the head and cervical spine was performed following the standard protocol without intravenous contrast. Multiplanar CT image reconstructions of the cervical spine were also generated. COMPARISON:  None. FINDINGS: CT HEAD FINDINGS Brain: Normal anatomic configuration of the brain. Mild parenchymal volume loss is commensurate with the patient's age. There are moderate to severe periventricular and subcortical white matter changes present likely reflecting the sequela of small vessel ischemia. 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 No acute intracranial hemorrhage or infarct. No abnormal mass effect  or midline shift. No abnormal intra or extra-axial mass lesion or fluid collection. Ventricular size is normal. Cerebellum is unremarkable. Vascular: No asymmetric hyperdense vasculature at the skull base. Skull: Intact Sinuses/Orbits: The paranasal sinuses are clear. The orbits are unremarkable. Other: Mastoid air cells and middle ear cavities are clear. CT CERVICAL SPINE FINDINGS Alignment: There is 2 mm anterolisthesis of C6 upon C7, likely posttraumatic in nature. Minimal anterolisthesis of C4 upon C5 is likely physiologic. Skull base and vertebrae: The craniocervical junction is unremarkable. Atlantal dental interval is normal. There are acute fractures involving the left lamina and spinous process of C6. There is a fracture involving the left pedicle of C6 as well as the the superior articular facet of C7 extending into the left C6-7 facet with mild joint space widening noted. Finally, a fracture extends through the pedicle of C6 on the right. Soft tissues and spinal canal: A small epidural hematoma measuring 3 mm x 10 mm is seen superficial to the C6 laminar fracture demonstrating minimal effacement of the canal a smaller epidural hematoma is seen superiorly posterior to the thecal sac at C5-6. No paraspinal fluid collection is identified. There is subcutaneous infiltration within the posterior soft tissues superficial to the spinous processes likely representing edema. Disc levels: Review of the sagittal reformats demonstrates preservation of vertebral body height. There is intervertebral disc space narrowing and endplate remodeling at D2-K0 in keeping with changes of moderate degenerative disc disease. 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 Review of the axial images confirms the above-mentioned fractures. There is multilevel uncovertebral and facet arthrosis resulting in multilevel mild neural foraminal narrowing. 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 Upper chest:  Unremarkable Other: None significant IMPRESSION: No acute intracranial injury.  No calvarial fracture. Acute fracture of the cervical spine predominantly involving the posterior elements of C6 with fracture bilateral pedicles and minimal anterolisthesis of C6 upon C7. The fracture pattern is suggestive of a hyperextension injury. Fractures also are identified involving the spinous process of C6, the left lamina of C6, and the left C7 superior articular facet extending into the left C6-7 facet which demonstrates mild relative widening. 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 Superimposed small epidural hematoma at the level of the fracture and at C5-6 slightly superiorly. These results will be called to the ordering clinician or representative by the Radiologist Assistant, and communication documented in the PACS or [employer]. Electronically Signed   By: Fidela Salisbury MD   On: 06/03/2020 02:26   MR Cervical Spine Wo Contrast  Result Date: 06/03/2020 CLINICAL DATA:  Fall with cervical spine fracture. EXAM: MRI CERVICAL SPINE WITHOUT CONTRAST TECHNIQUE: Multiplanar, multisequence MR imaging of the cervical spine was performed. No intravenous contrast was administered. COMPARISON:  Cervical  spine CT 06/03/2020 FINDINGS: Image quality is degraded by motion artifact and increased image noise due to the presence of a cervical spine collar limiting coil positioning. Alignment: Unchanged trace anterolisthesis of C6 on C7. Vertebrae: Mild posterior element edema at C6-7 related to underlying fractures, better demonstrated on CT. Chronic degenerative endplate changes at I9-5 and C6-7. Cord: No convincing spinal cord signal abnormality is identified within study limitations. A small dorsal epidural hematoma at C6-7 measures up to 3 mm in thickness. Posterior Fossa, vertebral arteries, paraspinal tissues: Minimal prevertebral edema. Mild right and moderate left-sided posterior paraspinal soft tissue edema  in the mid and lower cervical spine including in the inter spinous soft tissues at C5-6 and C6-7. Disc levels: The cervical spinal canal is diffusely narrow on a congenital basis. Detailed assessment of disc and facet degeneration is limited by motion, however there is severe multifactorial spinal stenosis at C3-4 greater than C4-5 with moderate cord flattening. Multifactorial spinal stenosis at C5-6 is moderate, and there is also moderate spinal stenosis at C6-7 in part secondary to the epidural hematoma. IMPRESSION: 1. Motion degraded examination. 2. Known posterior element fractures at C6-7 with associated mild marrow edema and moderate paraspinal soft tissue/posterior ligamentous complex edema. 3. Small dorsal epidural hematoma at C6-7 contributing to moderate spinal stenosis. 4. No convincing spinal cord signal abnormality. 5. 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 Electronically Signed   By: Logan Bores M.D.   On: 06/03/2020 06:12   DG Chest Portable 1 View  Result Date: 06/03/2020 CLINICAL DATA:  Foreign body evaluation for MRI. EXAM: PORTABLE CHEST 1 VIEW COMPARISON:  None. FINDINGS: The cardiomediastinal silhouette is within normal limits. The patient's chin projects over the right lung apex. There is slight prominence of the interstitial markings without overt pulmonary edema. No confluent airspace opacity, sizeable pleural effusion, or pneumothorax is identified. No acute osseous abnormality is seen. IMPRESSION: No radiopaque foreign body identified. Electronically Signed   By: Logan Bores M.D.   On: 06/03/2020 04:35   DG Shoulder Left  Result Date: 06/03/2020 CLINICAL DATA:  Pain EXAM: LEFT SHOULDER - 2+ VIEW COMPARISON:  None. FINDINGS: There is no evidence of fracture or dislocation. There is no evidence of arthropathy or other focal bone abnormality. Soft tissues are unremarkable. IMPRESSION: Negative. Electronically Signed   By: Constance Holster M.D.   On:  06/03/2020 02:55    ____________________________________________   PROCEDURES   Procedure(s) performed (including Critical Care):  .Critical Care Performed by: Hinda Kehr, MD Authorized by: Hinda Kehr, MD   Critical care provider statement:    Critical care time (minutes):  45   Critical care time was exclusive of:  Separately billable procedures and treating other patients   Critical care was necessary to treat or prevent imminent or life-threatening deterioration of the following conditions:  Trauma and CNS failure or compromise   Critical care was time spent personally by me on the following activities:  Development of treatment plan with patient or surrogate, discussions with consultants, evaluation of patient's response to treatment, examination of patient, obtaining history from patient or surrogate, ordering and performing treatments and interventions, ordering and review of laboratory studies, ordering and review of radiographic studies, pulse oximetry, re-evaluation of patient's condition and review of old charts     ____________________________________________   [place] / MDM / Equality / ED COURSE  As part of my medical decision making, I reviewed the following data within the [place] History obtained  from family, Nursing notes reviewed and incorporated, Labs reviewed , EKG interpreted , Old chart reviewed, Radiograph reviewed , Discussed with neurosurgeon (Dr. Lacinda Axon), Discussed with admitting physician (Dr. Sidney Ace), Discussed with radiologist and Notes from prior ED visits   Differential diagnosis includes, but is not limited to, cervical spine fracture, central cord syndrome or other acute spinal injury, intracranial bleeding, extremity injury due to fall, electrolyte or metabolic abnormality, infectious process.  The patient was reportedly asymptomatic before his fall which sounds like it was mechanical.  I saw the patient  in triage for medical screening exam and ordered imaging.  As documented below and the clinical course, I got a call from radiology that he has acute cervical spine fractures.  We are making a room for him on the main side of the emergency department.  His neurological exam has not changed substantially since the initial screening.  I will contact neurosurgery for management recommendations (transfer versus admission to this facility).       Clinical Course as of Jun 03 624  Mon Jun 03, 2020  2353 Took call from radiology confirming C-spine fracture.  Making bed available on main side for patient.   [CF]  0246 Discussing case by phone with Dr. Lacinda Axon with neurosurgery . He will call back after reviewing films.   [CF]  0309 I discussed the case by phone again with Dr. Lacinda Axon after he reviewed the imaging.  He recommended admitting the patient to the hospitalist for further management and medical clearance for surgery.  He said that the patient will need cervical spine surgery and requested I order an MRI of the cervical spine for additional evaluation and and to help them plan the surgery.  Dr. Jacqlyn Larsen will see the patient in the morning and discussed the case with the patient and with his family members including the recommendation for cervical spine surgery.  The patient needs to stay and in a cervical collar and preferably a [place] J or [place] if 1 is immediately available.  I ordered basic labs   [CF]  0309 The patient is increasingly agitated and showing signs of delirium on top of his chronic dementia.  He is not participating or following instructions and he is going to require a calming agent to keep him still and not put his neck at additional risk.  He did tolerate placement of the [place] J.  I called and spoke by phone with his son, Willian, with whom he lives.  I updated his son regarding the diagnosis and the plan to admit him for neurosurgical evaluation and eventually for spinal surgery.  The son  understands and agrees with this plan.  He also understands that his father is agitated right now and is going to require a calming agent.   [CF]  0407 EKG unremarkable, ordering droperidol 5 mg IV to help with agitation and to help the patient not only be more comfortable but be able to tolerate MRI of the cervical spine which is medically necessary.   [CF]  6144 Generally reassuring BMP  Basic metabolic panel(!) [CF]  3154 Normal coags  Protime-INR [CF]  0086 Personally viewed xrays.  No foreign bodies seen on plain films of abdomen and chest (needed for MRI given history of dementia).  CXR appears consistent with COPD/retention pattern, otherwise unremarkable.   [CF]  [phone] Discussed case in person with Dr. Sidney Ace with the hospitalist service who will admit.  MRI C-spine is pending at the time of admission.   [CF]  0501 SARS Coronavirus 2 by RT PCR: NEGATIVE [CF]  0623 No convincing spinal cord signal abnormality although the cervical spine fractures with some marrow edema are redemonstrated.  MR Cervical Spine Wo Contrast [CF]    Clinical Course User Index [CF] Hinda Kehr, MD     ____________________________________________  FINAL CLINICAL IMPRESSION(S) / ED DIAGNOSES  Final diagnoses:  Traumatic fracture of cervical spine ([place])  Dementia without behavioral disturbance, unspecified dementia type ([place])  Fall, initial encounter  Contusion of forehead, initial encounter  Musculoskeletal pain  Numbness of left thumb     MEDICATIONS GIVEN DURING THIS VISIT:  Medications  oxyCODONE-acetaminophen (PERCOCET/ROXICET) 5-325 MG per tablet 1 tablet (has no administration in time range)     ED Discharge Orders    None      *Please note:  Mitchell Stewart was evaluated in Emergency Department on 06/03/2020 for the symptoms described in the history of present illness. He was evaluated in the context of the global COVID-19 pandemic, which necessitated consideration that the patient  might be at risk for infection with the SARS-CoV-2 virus that causes COVID-19. Institutional protocols and algorithms that pertain to the evaluation of patients at risk for COVID-19 are in a state of rapid change based on information released by regulatory bodies including the CDC and federal and state organizations. These policies and algorithms were followed during the patient's care in the ED.  Some ED evaluations and interventions may be delayed as a result of limited staffing during and after the pandemic.*  Note:  This document was prepared using Dragon voice recognition software and may include unintentional dictation errors.   Hinda Kehr, MD 06/03/20 [phone]

## 2020-06-03 NOTE — Progress Notes (Signed)
Patient unable to answer admission questions. Alert to self only. Can tell me when he is hurting but is not able to answer any other questions.

## 2020-06-03 NOTE — Transfer of Care (Signed)
Immediate Anesthesia Transfer of Care Note  Patient: ADONTE VANRIPER  Procedure(s) Performed: ANTERIOR CERVICAL DECOMPRESSION/DISCECTOMY FUSION 1 LEVEL C6/7 (N/A )  Patient Location: PACU  Anesthesia Type:General  Level of Consciousness: drowsy  Airway & Oxygen Therapy: Patient Spontanous Breathing and Patient connected to face mask oxygen  Post-op Assessment: Report given to RN  Post vital signs: stable  Last Vitals:  Vitals Value Taken Time  BP    Temp    Pulse 98 06/03/20 2026  Resp 16 06/03/20 2026  SpO2 100 % 06/03/20 2026  Vitals shown include unvalidated device data.  Last Pain:  Vitals:   06/03/20 1643  TempSrc: Temporal  PainSc: 0-No pain         Complications: No complications documented.

## 2020-06-03 NOTE — Progress Notes (Signed)
[facility] [place] NAME: Mitchell Stewart    MR#:  [id number]  DATE OF BIRTH:  November 03, 1940  SUBJECTIVE:  patient seen in the emergency room. He has significant dementia. Does not understand much and unable to have meaningful conversation.  no family in the ER. Apparently patient fell in the middle of the night 15 steps landed on the concrete floor and started having neck pain.   REVIEW OF SYSTEMS:   Review of Systems  Unable to perform ROS: Dementia   Tolerating Diet:npo Tolerating PT:   DRUG ALLERGIES:  No Known Allergies  VITALS:  Blood pressure (!) 158/86, pulse 73, temperature 98.1 F (36.7 C), temperature source Oral, resp. rate 18, height 5\' 6"  (1.676 m), weight 56.7 kg, SpO2 100 %.  PHYSICAL EXAMINATION:   Physical Exam  GENERAL:  79 y.o.-year-old patient lying in the bed with no acute distress. Thin, friale HEENT: Head atraumatic, normocephalic. Oropharynx and nasopharynx clear.  NECK: neck collar+ LUNGS: Normal breath sounds bilaterally, no wheezing, rales, rhonchi. No use of accessory muscles of respiration.  CARDIOVASCULAR: S1, S2 normal. No murmurs, rubs, or gallops.  ABDOMEN: Soft, nontender, nondistended. Bowel sounds present. No organomegaly or mass.  EXTREMITIES: No cyanosis, clubbing or edema b/l.    NEUROLOGIC: grossly nonfocal. Moves all extremities well.  PSYCHIATRIC:  patient is awake. Confused at baseline. Has dementia. SKIN: per RN documentation  LABORATORY PANEL:  CBC Recent Labs  Lab 06/03/20 0320  WBC 12.1*  HGB 14.7  HCT 43.9  PLT 138*    Chemistries  Recent Labs  Lab 06/03/20 0320  NA 139  K 3.4*  CL 105  CO2 20*  GLUCOSE 148*  BUN 27*  CREATININE 1.17  CALCIUM 9.0   Cardiac Enzymes No results for input(s): TROPONINI in the last 168 hours. RADIOLOGY:  DG Shoulder Right  Result Date: 06/03/2020 CLINICAL DATA:  Pain status post fall EXAM: RIGHT SHOULDER - 2+ VIEW COMPARISON:  None.  FINDINGS: There is no evidence of fracture or dislocation. There is no evidence of arthropathy or other focal bone abnormality. Soft tissues are unremarkable. IMPRESSION: Negative. Electronically Signed   By: Constance Holster M.D.   On: 06/03/2020 02:54   DG Elbow Complete Left  Result Date: 06/03/2020 CLINICAL DATA:  Pain EXAM: LEFT ELBOW - COMPLETE 3+ VIEW COMPARISON:  None. FINDINGS: There is soft tissue swelling about the elbow. Degenerative changes are noted. There is no joint effusion. IMPRESSION: Soft tissue swelling without evidence of an acute displaced fracture. Electronically Signed   By: Constance Holster M.D.   On: 06/03/2020 02:57   DG Elbow Complete Right  Result Date: 06/03/2020 CLINICAL DATA:  Pain EXAM: RIGHT ELBOW - COMPLETE 3+ VIEW COMPARISON:  None. FINDINGS: There is no evidence of fracture, dislocation, or joint effusion. There is no evidence of arthropathy or other focal bone abnormality. Soft tissues are unremarkable. IMPRESSION: Negative. Electronically Signed   By: Constance Holster M.D.   On: 06/03/2020 02:58   DG Abdomen 1 View  Result Date: 06/03/2020 CLINICAL DATA:  Foreign body evaluation for MRI. EXAM: ABDOMEN - 1 VIEW COMPARISON:  None. FINDINGS: No radiopaque foreign body is identified. Gas is present in nondilated loops of small and large bowel without evidence of obstruction. No acute osseous abnormality is seen. IMPRESSION: No radiopaque foreign body identified. Electronically Signed   By: Logan Bores M.D.   On: 06/03/2020 04:33   CT Head Wo Contrast  Result Date: 06/03/2020 CLINICAL  DATA:  Fall downstairs, head injury, headache EXAM: CT HEAD WITHOUT CONTRAST CT CERVICAL SPINE WITHOUT CONTRAST TECHNIQUE: Multidetector CT imaging of the head and cervical spine was performed following the standard protocol without intravenous contrast. Multiplanar CT image reconstructions of the cervical spine were also generated. COMPARISON:  None. FINDINGS: CT HEAD  FINDINGS Brain: Normal anatomic configuration of the brain. Mild parenchymal volume loss is commensurate with the patient's age. There are moderate to severe periventricular and subcortical white matter changes present likely reflecting the sequela of small vessel ischemia. 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 No acute intracranial hemorrhage or infarct. No abnormal mass effect or midline shift. No abnormal intra or extra-axial mass lesion or fluid collection. Ventricular size is normal. Cerebellum is unremarkable. Vascular: No asymmetric hyperdense vasculature at the skull base. Skull: Intact Sinuses/Orbits: The paranasal sinuses are clear. The orbits are unremarkable. Other: Mastoid air cells and middle ear cavities are clear. CT CERVICAL SPINE FINDINGS Alignment: There is 2 mm anterolisthesis of C6 upon C7, likely posttraumatic in nature. Minimal anterolisthesis of C4 upon C5 is likely physiologic. Skull base and vertebrae: The craniocervical junction is unremarkable. Atlantal dental interval is normal. There are acute fractures involving the left lamina and spinous process of C6. There is a fracture involving the left pedicle of C6 as well as the the superior articular facet of C7 extending into the left C6-7 facet with mild joint space widening noted. Finally, a fracture extends through the pedicle of C6 on the right. Soft tissues and spinal canal: A small epidural hematoma measuring 3 mm x 10 mm is seen superficial to the C6 laminar fracture demonstrating minimal effacement of the canal a smaller epidural hematoma is seen superiorly posterior to the thecal sac at C5-6. No paraspinal fluid collection is identified. There is subcutaneous infiltration within the posterior soft tissues superficial to the spinous processes likely representing edema. Disc levels: Review of the sagittal reformats demonstrates preservation of vertebral body height. There is intervertebral disc space narrowing  and endplate remodeling at E7-N1 in keeping with changes of moderate degenerative disc disease. 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 Review of the axial images confirms the above-mentioned fractures. There is multilevel uncovertebral and facet arthrosis resulting in multilevel mild neural foraminal narrowing. 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 Upper chest: Unremarkable Other: None significant IMPRESSION: No acute intracranial injury.  No calvarial fracture. Acute fracture of the cervical spine predominantly involving the posterior elements of C6 with fracture bilateral pedicles and minimal anterolisthesis of C6 upon C7. The fracture pattern is suggestive of a hyperextension injury. Fractures also are identified involving the spinous process of C6, the left lamina of C6, and the left C7 superior articular facet extending into the left C6-7 facet which demonstrates mild relative widening. 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 Superimposed small epidural hematoma at the level of the fracture and at C5-6 slightly superiorly. These results will be called to the ordering clinician or representative by the Radiologist Assistant, and communication documented in the PACS or [employer]. Electronically Signed   By: Fidela Salisbury MD   On: 06/03/2020 02:26   CT Cervical Spine Wo Contrast  Result Date: 06/03/2020 CLINICAL DATA:  Fall downstairs, head injury, headache EXAM: CT HEAD WITHOUT CONTRAST CT CERVICAL SPINE WITHOUT CONTRAST TECHNIQUE: Multidetector CT imaging of the head and cervical spine was performed following the standard protocol without intravenous contrast. Multiplanar CT image reconstructions of the cervical spine were also generated.  COMPARISON:  None. FINDINGS: CT HEAD FINDINGS Brain: Normal anatomic configuration of the brain. Mild parenchymal volume loss is commensurate with the patient's age. There are moderate to severe periventricular and  subcortical white matter changes present likely reflecting the sequela of small vessel ischemia. 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 No acute intracranial hemorrhage or infarct. No abnormal mass effect or midline shift. No abnormal intra or extra-axial mass lesion or fluid collection. Ventricular size is normal. Cerebellum is unremarkable. Vascular: No asymmetric hyperdense vasculature at the skull base. Skull: Intact Sinuses/Orbits: The paranasal sinuses are clear. The orbits are unremarkable. Other: Mastoid air cells and middle ear cavities are clear. CT CERVICAL SPINE FINDINGS Alignment: There is 2 mm anterolisthesis of C6 upon C7, likely posttraumatic in nature. Minimal anterolisthesis of C4 upon C5 is likely physiologic. Skull base and vertebrae: The craniocervical junction is unremarkable. Atlantal dental interval is normal. There are acute fractures involving the left lamina and spinous process of C6. There is a fracture involving the left pedicle of C6 as well as the the superior articular facet of C7 extending into the left C6-7 facet with mild joint space widening noted. Finally, a fracture extends through the pedicle of C6 on the right. Soft tissues and spinal canal: A small epidural hematoma measuring 3 mm x 10 mm is seen superficial to the C6 laminar fracture demonstrating minimal effacement of the canal a smaller epidural hematoma is seen superiorly posterior to the thecal sac at C5-6. No paraspinal fluid collection is identified. There is subcutaneous infiltration within the posterior soft tissues superficial to the spinous processes likely representing edema. Disc levels: Review of the sagittal reformats demonstrates preservation of vertebral body height. There is intervertebral disc space narrowing and endplate remodeling at I2-L7 in keeping with changes of moderate degenerative disc disease. 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 Review of the axial images  confirms the above-mentioned fractures. There is multilevel uncovertebral and facet arthrosis resulting in multilevel mild neural foraminal narrowing. 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 Upper chest: Unremarkable Other: None significant IMPRESSION: No acute intracranial injury.  No calvarial fracture. Acute fracture of the cervical spine predominantly involving the posterior elements of C6 with fracture bilateral pedicles and minimal anterolisthesis of C6 upon C7. The fracture pattern is suggestive of a hyperextension injury. Fractures also are identified involving the spinous process of C6, the left lamina of C6, and the left C7 superior articular facet extending into the left C6-7 facet which demonstrates mild relative widening. 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 Superimposed small epidural hematoma at the level of the fracture and at C5-6 slightly superiorly. These results will be called to the ordering clinician or representative by the Radiologist Assistant, and communication documented in the PACS or [employer]. Electronically Signed   By: Fidela Salisbury MD   On: 06/03/2020 02:26   MR Cervical Spine Wo Contrast  Result Date: 06/03/2020 CLINICAL DATA:  Fall with cervical spine fracture. EXAM: MRI CERVICAL SPINE WITHOUT CONTRAST TECHNIQUE: Multiplanar, multisequence MR imaging of the cervical spine was performed. No intravenous contrast was administered. COMPARISON:  Cervical spine CT 06/03/2020 FINDINGS: Image quality is degraded by motion artifact and increased image noise due to the presence of a cervical spine collar limiting coil positioning. Alignment: Unchanged trace anterolisthesis of C6 on C7. Vertebrae: Mild posterior element edema at C6-7 related to underlying fractures, better demonstrated on CT. Chronic degenerative endplate changes at L8-9 and C6-7. Cord: No convincing spinal  cord signal abnormality is identified within study limitations. A  small dorsal epidural hematoma at C6-7 measures up to 3 mm in thickness. Posterior Fossa, vertebral arteries, paraspinal tissues: Minimal prevertebral edema. Mild right and moderate left-sided posterior paraspinal soft tissue edema in the mid and lower cervical spine including in the inter spinous soft tissues at C5-6 and C6-7. Disc levels: The cervical spinal canal is diffusely narrow on a congenital basis. Detailed assessment of disc and facet degeneration is limited by motion, however there is severe multifactorial spinal stenosis at C3-4 greater than C4-5 with moderate cord flattening. Multifactorial spinal stenosis at C5-6 is moderate, and there is also moderate spinal stenosis at C6-7 in part secondary to the epidural hematoma. IMPRESSION: 1. Motion degraded examination. 2. Known posterior element fractures at C6-7 with associated mild marrow edema and moderate paraspinal soft tissue/posterior ligamentous complex edema. 3. Small dorsal epidural hematoma at C6-7 contributing to moderate spinal stenosis. 4. No convincing spinal cord signal abnormality. 5. 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 Electronically Signed   By: Logan Bores M.D.   On: 06/03/2020 06:12   DG Chest Portable 1 View  Result Date: 06/03/2020 CLINICAL DATA:  Foreign body evaluation for MRI. EXAM: PORTABLE CHEST 1 VIEW COMPARISON:  None. FINDINGS: The cardiomediastinal silhouette is within normal limits. The patient's chin projects over the right lung apex. There is slight prominence of the interstitial markings without overt pulmonary edema. No confluent airspace opacity, sizeable pleural effusion, or pneumothorax is identified. No acute osseous abnormality is seen. IMPRESSION: No radiopaque foreign body identified. Electronically Signed   By: Logan Bores M.D.   On: 06/03/2020 04:35   DG Shoulder Left  Result Date: 06/03/2020 CLINICAL DATA:  Pain EXAM: LEFT SHOULDER - 2+ VIEW COMPARISON:  None. FINDINGS:  There is no evidence of fracture or dislocation. There is no evidence of arthropathy or other focal bone abnormality. Soft tissues are unremarkable. IMPRESSION: Negative. Electronically Signed   By: Constance Holster M.D.   On: 06/03/2020 02:55   ASSESSMENT AND PLAN:   Mitchell Stewart  is a 79 y.o. African-American male with a known history of type 2 diabetes mellitus, dyslipidemia, dementia and depression as well as prostate cancer, presented to the emergency room with acute onset of accidental mechanical fall after getting up to go to the bathroom and falling down a flight of 15 stairs landing on his back on concrete with subsequent neck pain as well as bilateral elbow pain. Patient has significant dementia and confuse unable to give any history review of systems    1.  C6-C7 traumatic spine fracture secondary to mechanical fall. - continue immobilizing his C-spine with neck collar -C-spine MRI -- unstable C67 fracture -Pain management with PO and IV pain meds -Neurosurgery consultation with Dr. Deetta Perla who plans to take patient to the OR for fixation of C67 fracture  --The patient has diabetes mellitus on insulin, with no history of CHF or renal failure with creatinine more than 2, CHF or CVA.  He is considered slightly above average risk for his age for perioperative cardiovascular events per the revised cardiac risk index.  He has no current pulmonary issues. -- IV fluids  2.  Type II diabetes mellitus without complications on long-term insulin therapy. - on supplement coverage with NovoLog. -- will continue his basal coverage.  3.  Dyslipidemia. -continue statin therapy.  4.  Dementia with depression. Behavioral issues/agitation Per some -continue Seroquel. -- PRN Ativan/Haldol  5.  BPH. - continue  Flomax.  6.  DVT prophylaxis. -SCDs. -Medical prophylaxis currently held off pending expected operative intervention later today.   CODE STATUS: Full code  Status is:  Inpatient  Remains inpatient appropriate because:Ongoing active pain requiring inpatient pain management, Ongoing diagnostic testing needed not appropriate for outpatient work up, Unsafe d/c plan, IV treatments appropriate due to intensity of illness or inability to take PO and Inpatient level of care appropriate due to severity of illness.  The patient will need surgical intervention for his C-spine injury.   Dispo: The patient is from: Home  Anticipated d/c is to:TBD  Anticipated d/c date is: 3 days  Patient currently is not medically stable to d/c. Spoke with patient's son Mitchell Stewart     TOTAL TIME TAKING CARE OF THIS PATIENT: *25* minutes.  >50% time spent on counselling and coordination of care  Note: This dictation was prepared with Dragon dictation along with smaller phrase technology. Any transcriptional errors that result from this process are unintentional.  Fritzi Mandes M.D    [facility]   CC: Primary care physician; Lesia Hausen, PAPatient ID: Mitchell Stewart, male   DOB: [birth year], 79 y.o.   MRN: [id number]

## 2020-06-03 NOTE — H&P (Signed)
[place]   PATIENT NAME: Mitchell Stewart    MR#:  [id number]  DATE OF BIRTH:  30-Jan-1941  DATE OF ADMISSION:  06/03/2020  PRIMARY CARE PHYSICIAN: Lesia Hausen, PA   REQUESTING/REFERRING PHYSICIAN: Hinda Kehr, MD CHIEF COMPLAINT:   Chief Complaint  Patient presents with  . Fall    HISTORY OF PRESENT ILLNESS:  Mitchell Stewart  is a 79 y.o. African-American male with a known history of type 2 diabetes mellitus, dyslipidemia, dementia and depression as well as prostate cancer, presented to the emergency room with acute onset of accidental mechanical fall after getting up to go to the bathroom and falling down a flight of 15 stairs landing on his back on concrete with subsequent neck pain as well as bilateral elbow pain.  No presyncope or syncope.  No paresthesias or focal muscle weakness.  No nausea vomiting or abdominal pain.  No urinary or stool incontinence.  No vertigo or tinnitus.  No witnessed seizures.  The patient is a very poor historian though due to his dementia.  His son offered history earlier most of the history this morning.  Upon presentation to the emergency room, blood pressure was 158/86 with otherwise normal vital signs.  Labs revealed mild hypokalemia 3.4 and a blood glucose of 148 with a BUN of 27 creatinine of 1.17 and CBC showed mild leukocytosis of 12.1 and thrombocytopenia 138 as well as mild neutrophilia.  COVID-19 PCR and influenza antigens came back negative.  Portable chest ray and abdomen x-ray showed no acute abnormalities.  Bilateral elbow and shoulder x-rays showed soft tissue swelling of the left elbow without evidence of acute displaced fracture and weer otherwise negative. EKG showed normal sinus rhythm with a rate of 91 with poor R wave progression.  Dr. Deetta Perla was notified but the patient and is aware.  The patient was given 5 mg of IV Inapsine.  He will be admitted to a medical-surgical bed for further evaluation and management.  PAST MEDICAL  HISTORY:   Past Medical History:  Diagnosis Date  . Dementia ([place])   . Depression   . Diabetes mellitus without complication ([place])   . Erectile dysfunction   . Hard of hearing   . History of diverticulitis   . History of kidney stones   . Hyperlipidemia   . Osteoporosis   . Prostate cancer ([place])   Urolithiasis, prostatitis  PAST SURGICAL HISTORY:  ESWL for kidney stones and no other surgeries  SOCIAL HISTORY:   Social History   Tobacco Use  . Smoking status: Never Smoker  . Smokeless tobacco: Never Used  Substance Use Topics  . Alcohol use: Not on file    FAMILY HISTORY:  Positive for history of prostate cancer, diabetes mellitus and coronary artery disease  DRUG ALLERGIES:  No Known Allergies  REVIEW OF SYSTEMS:   ROS As per history of present illness. All pertinent systems were reviewed above. Constitutional, HEENT, cardiovascular, respiratory, GI, GU, musculoskeletal, neuro, psychiatric, endocrine, integumentary and hematologic systems were reviewed and are otherwise negative/unremarkable except for positive findings mentioned above in the HPI.   MEDICATIONS AT HOME:   Prior to Admission medications   Not on File      VITAL SIGNS:  Blood pressure (!) 158/86, pulse 73, temperature 98.1 F (36.7 C), temperature source Oral, resp. rate 18, height 5\' 6"  (1.676 m), weight 56.7 kg, SpO2 100 %.  PHYSICAL EXAMINATION:  Physical Exam  GENERAL:  79 y.o.-year-old African-American male patient lying in  the bed with no acute distress.  EYES: Pupils equal, round, reactive to light and accommodation. No scleral icterus. Extraocular muscles intact.  HEENT: Head atraumatic, normocephalic. Oropharynx and nasopharynx clear.  NECK: In C-collar, no jugular venous distention. No thyroid enlargement, no tenderness.  LUNGS: Normal breath sounds bilaterally, no wheezing, rales,rhonchi or crepitation. No use of accessory muscles of respiration.  CARDIOVASCULAR: Regular rate and  rhythm, S1, S2 normal. No murmurs, rubs, or gallops.  ABDOMEN: Soft, nondistended, nontender. Bowel sounds present. No organomegaly or mass.  EXTREMITIES: No pedal edema, cyanosis, or clubbing.  NEUROLOGIC: Cranial nerves II through XII are intact. Muscle strength 5/5 in all extremities. Sensation intact. Gait not checked.  PSYCHIATRIC: The patient is alert and mostly cooperative.  Normal affect and good eye contact. SKIN: No obvious rash, lesion, or ulcer.   LABORATORY PANEL:   CBC Recent Labs  Lab 06/03/20 0320  WBC 12.1*  HGB 14.7  HCT 43.9  PLT 138*   ------------------------------------------------------------------------------------------------------------------  Chemistries  Recent Labs  Lab 06/03/20 0320  NA 139  K 3.4*  CL 105  CO2 20*  GLUCOSE 148*  BUN 27*  CREATININE 1.17  CALCIUM 9.0   ------------------------------------------------------------------------------------------------------------------  Cardiac Enzymes No results for input(s): TROPONINI in the last 168 hours. ------------------------------------------------------------------------------------------------------------------  RADIOLOGY:  DG Shoulder Right  Result Date: 06/03/2020 CLINICAL DATA:  Pain status post fall EXAM: RIGHT SHOULDER - 2+ VIEW COMPARISON:  None. FINDINGS: There is no evidence of fracture or dislocation. There is no evidence of arthropathy or other focal bone abnormality. Soft tissues are unremarkable. IMPRESSION: Negative. Electronically Signed   By: Constance Holster M.D.   On: 06/03/2020 02:54   DG Elbow Complete Left  Result Date: 06/03/2020 CLINICAL DATA:  Pain EXAM: LEFT ELBOW - COMPLETE 3+ VIEW COMPARISON:  None. FINDINGS: There is soft tissue swelling about the elbow. Degenerative changes are noted. There is no joint effusion. IMPRESSION: Soft tissue swelling without evidence of an acute displaced fracture. Electronically Signed   By: Constance Holster M.D.   On:  06/03/2020 02:57   DG Elbow Complete Right  Result Date: 06/03/2020 CLINICAL DATA:  Pain EXAM: RIGHT ELBOW - COMPLETE 3+ VIEW COMPARISON:  None. FINDINGS: There is no evidence of fracture, dislocation, or joint effusion. There is no evidence of arthropathy or other focal bone abnormality. Soft tissues are unremarkable. IMPRESSION: Negative. Electronically Signed   By: Constance Holster M.D.   On: 06/03/2020 02:58   DG Abdomen 1 View  Result Date: 06/03/2020 CLINICAL DATA:  Foreign body evaluation for MRI. EXAM: ABDOMEN - 1 VIEW COMPARISON:  None. FINDINGS: No radiopaque foreign body is identified. Gas is present in nondilated loops of small and large bowel without evidence of obstruction. No acute osseous abnormality is seen. IMPRESSION: No radiopaque foreign body identified. Electronically Signed   By: Logan Bores M.D.   On: 06/03/2020 04:33   CT Head Wo Contrast  Result Date: 06/03/2020 CLINICAL DATA:  Fall downstairs, head injury, headache EXAM: CT HEAD WITHOUT CONTRAST CT CERVICAL SPINE WITHOUT CONTRAST TECHNIQUE: Multidetector CT imaging of the head and cervical spine was performed following the standard protocol without intravenous contrast. Multiplanar CT image reconstructions of the cervical spine were also generated. COMPARISON:  None. FINDINGS: CT HEAD FINDINGS Brain: Normal anatomic configuration of the brain. Mild parenchymal volume loss is commensurate with the patient's age. There are moderate to severe periventricular and subcortical white matter changes present likely reflecting the sequela of small vessel ischemia. 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 No acute intracranial hemorrhage or infarct. No abnormal mass effect or midline shift. No abnormal intra or extra-axial mass lesion or fluid collection. Ventricular size is normal. Cerebellum is unremarkable. Vascular: No asymmetric hyperdense vasculature at the skull base. Skull: Intact Sinuses/Orbits: The  paranasal sinuses are clear. The orbits are unremarkable. Other: Mastoid air cells and middle ear cavities are clear. CT CERVICAL SPINE FINDINGS Alignment: There is 2 mm anterolisthesis of C6 upon C7, likely posttraumatic in nature. Minimal anterolisthesis of C4 upon C5 is likely physiologic. Skull base and vertebrae: The craniocervical junction is unremarkable. Atlantal dental interval is normal. There are acute fractures involving the left lamina and spinous process of C6. There is a fracture involving the left pedicle of C6 as well as the the superior articular facet of C7 extending into the left C6-7 facet with mild joint space widening noted. Finally, a fracture extends through the pedicle of C6 on the right. Soft tissues and spinal canal: A small epidural hematoma measuring 3 mm x 10 mm is seen superficial to the C6 laminar fracture demonstrating minimal effacement of the canal a smaller epidural hematoma is seen superiorly posterior to the thecal sac at C5-6. No paraspinal fluid collection is identified. There is subcutaneous infiltration within the posterior soft tissues superficial to the spinous processes likely representing edema. Disc levels: Review of the sagittal reformats demonstrates preservation of vertebral body height. There is intervertebral disc space narrowing and endplate remodeling at O1-Y0 in keeping with changes of moderate degenerative disc disease. 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 Review of the axial images confirms the above-mentioned fractures. There is multilevel uncovertebral and facet arthrosis resulting in multilevel mild neural foraminal narrowing. 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 Upper chest: Unremarkable Other: None significant IMPRESSION: No acute intracranial injury.  No calvarial fracture. Acute fracture of the cervical spine predominantly involving the posterior elements of C6 with fracture bilateral pedicles and minimal  anterolisthesis of C6 upon C7. The fracture pattern is suggestive of a hyperextension injury. Fractures also are identified involving the spinous process of C6, the left lamina of C6, and the left C7 superior articular facet extending into the left C6-7 facet which demonstrates mild relative widening. 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 Superimposed small epidural hematoma at the level of the fracture and at C5-6 slightly superiorly. These results will be called to the ordering clinician or representative by the Radiologist Assistant, and communication documented in the PACS or [employer]. Electronically Signed   By: Fidela Salisbury MD   On: 06/03/2020 02:26   CT Cervical Spine Wo Contrast  Result Date: 06/03/2020 CLINICAL DATA:  Fall downstairs, head injury, headache EXAM: CT HEAD WITHOUT CONTRAST CT CERVICAL SPINE WITHOUT CONTRAST TECHNIQUE: Multidetector CT imaging of the head and cervical spine was performed following the standard protocol without intravenous contrast. Multiplanar CT image reconstructions of the cervical spine were also generated. COMPARISON:  None. FINDINGS: CT HEAD FINDINGS Brain: Normal anatomic configuration of the brain. Mild parenchymal volume loss is commensurate with the patient's age. There are moderate to severe periventricular and subcortical white matter changes present likely reflecting the sequela of small vessel ischemia. 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 No acute intracranial hemorrhage or infarct. No abnormal mass effect or midline shift. No abnormal intra or extra-axial mass lesion or fluid collection. Ventricular size is normal. Cerebellum is unremarkable. Vascular: No asymmetric hyperdense vasculature at the skull base. Skull: Intact Sinuses/Orbits: The paranasal  sinuses are clear. The orbits are unremarkable. Other: Mastoid air cells and middle ear cavities are clear. CT CERVICAL SPINE FINDINGS Alignment: There is 2 mm  anterolisthesis of C6 upon C7, likely posttraumatic in nature. Minimal anterolisthesis of C4 upon C5 is likely physiologic. Skull base and vertebrae: The craniocervical junction is unremarkable. Atlantal dental interval is normal. There are acute fractures involving the left lamina and spinous process of C6. There is a fracture involving the left pedicle of C6 as well as the the superior articular facet of C7 extending into the left C6-7 facet with mild joint space widening noted. Finally, a fracture extends through the pedicle of C6 on the right. Soft tissues and spinal canal: A small epidural hematoma measuring 3 mm x 10 mm is seen superficial to the C6 laminar fracture demonstrating minimal effacement of the canal a smaller epidural hematoma is seen superiorly posterior to the thecal sac at C5-6. No paraspinal fluid collection is identified. There is subcutaneous infiltration within the posterior soft tissues superficial to the spinous processes likely representing edema. Disc levels: Review of the sagittal reformats demonstrates preservation of vertebral body height. There is intervertebral disc space narrowing and endplate remodeling at X9-J4 in keeping with changes of moderate degenerative disc disease. 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 Review of the axial images confirms the above-mentioned fractures. There is multilevel uncovertebral and facet arthrosis resulting in multilevel mild neural foraminal narrowing. 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 Upper chest: Unremarkable Other: None significant IMPRESSION: No acute intracranial injury.  No calvarial fracture. Acute fracture of the cervical spine predominantly involving the posterior elements of C6 with fracture bilateral pedicles and minimal anterolisthesis of C6 upon C7. The fracture pattern is suggestive of a hyperextension injury. Fractures also are identified involving the spinous process of C6, the left  lamina of C6, and the left C7 superior articular facet extending into the left C6-7 facet which demonstrates mild relative widening. 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 Superimposed small epidural hematoma at the level of the fracture and at C5-6 slightly superiorly. These results will be called to the ordering clinician or representative by the Radiologist Assistant, and communication documented in the PACS or [employer]. Electronically Signed   By: Fidela Salisbury MD   On: 06/03/2020 02:26   DG Chest Portable 1 View  Result Date: 06/03/2020 CLINICAL DATA:  Foreign body evaluation for MRI. EXAM: PORTABLE CHEST 1 VIEW COMPARISON:  None. FINDINGS: The cardiomediastinal silhouette is within normal limits. The patient's chin projects over the right lung apex. There is slight prominence of the interstitial markings without overt pulmonary edema. No confluent airspace opacity, sizeable pleural effusion, or pneumothorax is identified. No acute osseous abnormality is seen. IMPRESSION: No radiopaque foreign body identified. Electronically Signed   By: Logan Bores M.D.   On: 06/03/2020 04:35   DG Shoulder Left  Result Date: 06/03/2020 CLINICAL DATA:  Pain EXAM: LEFT SHOULDER - 2+ VIEW COMPARISON:  None. FINDINGS: There is no evidence of fracture or dislocation. There is no evidence of arthropathy or other focal bone abnormality. Soft tissues are unremarkable. IMPRESSION: Negative. Electronically Signed   By: Constance Holster M.D.   On: 06/03/2020 02:55      IMPRESSION AND PLAN:   1.  C6-C7 traumatic spine fracture secondary to mechanical fall. -The patient will be admitted to a med-surg bed. -We will continue immobilizing his C-spine. -C-spine MRI is currently pending. -Pain management will be provided. -Neurosurgery consultation will  be obtained. -Dr. Deetta Perla was notified and is aware about the patient. -The patient has diabetes mellitus on insulin, with no history of CHF or  renal failure with creatinine more than 2, CHF or CVA.  He is considered slightly above average risk for his age for perioperative cardiovascular events per the revised cardiac risk index.  He has no current pulmonary issues.  2.  Type II diabetes mellitus without complications on long-term insulin therapy. -She will be placed on supplement coverage with NovoLog. -We will continue his basal coverage.  3.  Dyslipidemia. -We will continue statin therapy.  4.  Dementia with depression. -We will continue Seroquel.  5.  BPH. -We will continue Flomax.  6.  DVT prophylaxis. -SCDs. -Medical prophylaxis currently held off pending expected operative intervention later today.   All the records are reviewed and case discussed with ED provider. The plan of care was discussed in details with the patient (and family). I answered all questions. The patient agreed to proceed with the above mentioned plan. Further management will depend upon hospital course.   CODE STATUS: Full code  Status is: Inpatient  Remains inpatient appropriate because:Ongoing active pain requiring inpatient pain management, Ongoing diagnostic testing needed not appropriate for outpatient work up, Unsafe d/c plan, IV treatments appropriate due to intensity of illness or inability to take PO and Inpatient level of care appropriate due to severity of illness.  The patient will need surgical intervention for his C-spine injury.   Dispo: The patient is from: Home              Anticipated d/c is to: Home              Anticipated d/c date is: 3 days              Patient currently is not medically stable to d/c.  TOTAL TIME TAKING CARE OF THIS PATIENT: 55 minutes.    Christel Mormon M.D on 06/03/2020 at 4:58 AM  [facility]   From 7 PM-7 AM, contact night-coverage [email or web address]  CC: Primary care physician; Lesia Hausen, PA

## 2020-06-03 NOTE — ED Notes (Signed)
Report given to OR RN.

## 2020-06-03 NOTE — ED Notes (Signed)
Dr. Karma Greaser to triage to see patient.

## 2020-06-03 NOTE — ED Notes (Signed)
[place] collar placed on patient per MD order.

## 2020-06-03 NOTE — ED Triage Notes (Addendum)
Emergency Medicine Provider Triage Evaluation Note  Mitchell Stewart , a 79 y.o. male  was evaluated in triage.  Pt complains of pain after fall.  Reportedly fell down 15 stairs after tripping (mechanical fall) and landed on back on concrete.  Review of Systems  Positive: pain in both elbows, neck Negative: chest pain, SOB, abdominal pain, headache  Physical Exam  There were no vitals taken for this visit. Gen:   Awake, no distress   HEENT:  Abrasion/contusion to right forehead, no epistaxis, pupils equal Resp:  Normal effort  Cardiac:  Normal rate  Abd:   Nondistended, nontender  MSK:   Patient first reports pain with movement of both arms, then states it actually feels better to flex elbows.  No gross deformities.  Able to reach across his body with both arms but reports it is painful to do so.  Mild tenderness to palpation of the neck.  C-collar in place from EMS. Neuro:  Speech clear, no obvious focal neuro deficits, though the patient reports numbness in his left thumb only.  Medical Decision Making  Medically screening exam initiated at 1:15 AM.  Appropriate orders placed.  [name] was informed that the remainder of the evaluation will be completed by another provider, this initial triage assessment does not replace that evaluation, and the importance of remaining in the ED until their evaluation is complete.  Clinical Impression  Probable MSK pain/strain from fall with contusions and abrasion.  Low suspicious for intracranial bleed or significant fracture/dislocation, but given limited history due to dementia, age/co-morbidities, and mechanism of injury, will evaluate with imaging of head, c-spine, and upper extremities.      Hinda Kehr, MD 06/03/20 [phone]

## 2020-06-03 NOTE — Consult Note (Signed)
Neurosurgery-New Consultation Evaluation 06/03/2020 Mitchell Stewart [id number]  Identifying Statement: Mitchell Stewart is a 79 y.o. male from [place] [address] with spine fracture  Physician Requesting Consultation: [place] [facility]  History of Present Illness: Mitchell Stewart is here after a fall and resulting spine fracture. He does endorse some neck pain. He also endorses some thumb numbness but otherwise denies weakness or numbness. He is a poor historian given dementia. Son was contacted who says he can become combative and impulsive and walks on his own and did fall down steps.   Past Medical History:  Past Medical History:  Diagnosis Date  . Dementia ([place])   . Depression   . Diabetes mellitus without complication ([place])   . Erectile dysfunction   . Hard of hearing   . History of diverticulitis   . History of kidney stones   . Hyperlipidemia   . Osteoporosis   . Prostate cancer [facility])     Social History: Social History   Socioeconomic History  . Marital status: Legally Separated    Spouse name: Not on file  . Number of children: Not on file  . Years of education: Not on file  . Highest education level: Not on file  Occupational History  . Not on file  Tobacco Use  . Smoking status: Never Smoker  . Smokeless tobacco: Never Used  Substance and Sexual Activity  . Alcohol use: Not on file  . Drug use: Not on file  . Sexual activity: Not on file  Other Topics Concern  . Not on file  Social History Narrative  . Not on file   Social Determinants of Health   Financial Resource Strain:   . Difficulty of Paying Living Expenses: Not on file  Food Insecurity:   . Worried About Charity fundraiser in the Last Year: Not on file  . Ran Out of Food in the Last Year: Not on file  Transportation Needs:   . Lack of Transportation (Medical): Not on file  . Lack of Transportation (Non-Medical): Not on file  Physical Activity:   . Days of Exercise per Week: Not on file  .  Minutes of Exercise per Session: Not on file  Stress:   . Feeling of Stress : Not on file  Social Connections:   . Frequency of Communication with Friends and Family: Not on file  . Frequency of Social Gatherings with Friends and Family: Not on file  . Attends Religious Services: Not on file  . Active Member of Clubs or Organizations: Not on file  . Attends Archivist Meetings: Not on file  . Marital Status: Not on file  Intimate Partner Violence:   . Fear of Current or Ex-Partner: Not on file  . Emotionally Abused: Not on file  . Physically Abused: Not on file  . Sexually Abused: Not on file   Living arrangements (living alone, with partner): Son has HCPOA  Family History: History reviewed. No pertinent family history.  Review of Systems:  Review of Systems - General ROS: Negative Psychological ROS: Negative Ophthalmic ROS: Negative ENT ROS: Negative Hematological and Lymphatic ROS: Negative  Endocrine ROS: Negative Respiratory ROS: Negative Cardiovascular ROS: Negative Gastrointestinal ROS: Negative Genito-Urinary ROS: Negative Musculoskeletal ROS: positive for neck pain Neurological ROS: Negative Dermatological ROS: Negative  Physical Exam: BP (!) 158/86   Pulse 73   Temp 98.1 F (36.7 C) (Oral)   Resp 18   Ht 5\' 6"  (1.676 m)   Wt 56.7 kg  SpO2 100%   BMI 20.18 kg/m  Body mass index is 20.18 kg/m. Body surface area is 1.62 meters squared. General appearance: Alert, cooperative, in no acute distress Head: Normocephalic, abrasion to forehead Eyes: Normal, EOM intact Oropharynx: Wearing facemask Neck: Supple, no tenderness, in cervical collar Ext: No edema in LE bilaterally  Neurologic exam:  Mental status: alertness: alert, disoriented affect: normal Speech: fluent and clear Motor:strength symmetric 4+/5 in grip and biceps, shoulder pain but is antigravity. He has 5/5 strength in lower extremities Sensory: intact to light touch in all  extremities Gait: not tested  Laboratory: Results for orders placed or performed during the hospital encounter of 06/03/20  Respiratory Panel by RT PCR (Flu A&B, Covid) - Nasopharyngeal Swab   Specimen: Nasopharyngeal Swab  Result Value Ref Range   SARS Coronavirus 2 by RT PCR NEGATIVE NEGATIVE   Influenza A by PCR NEGATIVE NEGATIVE   Influenza B by PCR NEGATIVE NEGATIVE  CBC with Differential/Platelet  Result Value Ref Range   WBC 12.1 (H) 4.0 - 10.5 K/uL   RBC 5.00 4.22 - 5.81 MIL/uL   Hemoglobin 14.7 13.0 - 17.0 g/dL   HCT 43.9 39 - 52 %   MCV 87.8 80.0 - 100.0 fL   MCH 29.4 26.0 - 34.0 pg   MCHC 33.5 30.0 - 36.0 g/dL   RDW 12.9 11.5 - 15.5 %   Platelets 138 (L) 150 - 400 K/uL   nRBC 0.0 0.0 - 0.2 %   Neutrophils Relative % 86 %   Neutro Abs 10.4 (H) 1.7 - 7.7 K/uL   Lymphocytes Relative 8 %   Lymphs Abs 1.0 0.7 - 4.0 K/uL   Monocytes Relative 5 %   Monocytes Absolute 0.6 0.1 - 1.0 K/uL   Eosinophils Relative 0 %   Eosinophils Absolute 0.0 0 - 0 K/uL   Basophils Relative 0 %   Basophils Absolute 0.0 0 - 0 K/uL   Immature Granulocytes 1 %   Abs Immature Granulocytes 0.09 (H) 0.00 - 0.07 K/uL  Basic metabolic panel  Result Value Ref Range   Sodium 139 135 - 145 mmol/L   Potassium 3.4 (L) 3.5 - 5.1 mmol/L   Chloride 105 98 - 111 mmol/L   CO2 20 (L) 22 - 32 mmol/L   Glucose, Bld 148 (H) 70 - 99 mg/dL   BUN 27 (H) 8 - 23 mg/dL   Creatinine, Ser 1.17 0.61 - 1.24 mg/dL   Calcium 9.0 8.9 - 10.3 mg/dL   GFR, Estimated 59 (L) >60 mL/min   Anion gap 14 5 - 15  Protime-INR  Result Value Ref Range   Prothrombin Time 12.7 11.4 - 15.2 seconds   INR 1.0 0.8 - 1.2  Glucose, capillary  Result Value Ref Range   Glucose-Capillary 146 (H) 70 - 99 mg/dL   I personally reviewed labs  Imaging: CT Cervical spine: Acute fracture of the cervical spine predominantly involving the posterior elements of C6 with fracture bilateral pedicles and minimal anterolisthesis of C6 upon C7.  The fracture pattern is suggestive of a hyperextension injury. Fractures also are identified involving the spinous process of C6, the left lamina of C6, and the left C7 superior articular facet extending into the left C6-7 facet which demonstrates mild relative widening.  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 Superimposed small epidural hematoma at the level of the fracture and at C5-6 slightly superiorly.  MRI Cervical Spine: 1. Motion degraded examination. 2. Known posterior element fractures at C6-7 with associated  mild marrow edema and moderate paraspinal soft tissue/posterior ligamentous complex edema. 3. Small dorsal epidural hematoma at C6-7 contributing to moderate spinal stenosis. 4. No convincing spinal cord signal abnormality. 5. 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  Impression/Plan:  Mitchell Collier has a C6/7 fracture that I believe to be unstable. I discussed with son need for fixation vs wearing collar and risk of nonunion with injury. He wants to proceed with surgery and we will plan on C6/7 ACDF. Alll risks including hematoma, infection, damage to spinal cord, dysphagia, and anesthesia were discussed.    1.  Diagnosis: C6/7 fracture   2.  Plan C6/7 ACDF today

## 2020-06-03 NOTE — Anesthesia Preprocedure Evaluation (Addendum)
Anesthesia Evaluation  Patient identified by MRN, date of birth, ID band Patient awake    Reviewed: Allergy & Precautions, H&P , NPO status , Patient's Chart, lab work & pertinent test results  History of Anesthesia Complications Negative for: history of anesthetic complications  Airway    Neck ROM: limited   Comment: C-collar in place Unable to assess Mallampati  Dental  (+) Upper Dentures, Lower Dentures   Pulmonary neg pulmonary ROS, neg sleep apnea, neg COPD,           Cardiovascular (-) angina(-) Past MI and (-) Cardiac Stents negative cardio ROS  (-) dysrhythmias      Neuro/Psych PSYCHIATRIC DISORDERS Depression Dementia Confused at baseline, often does not recognize family members, sometimes combative especially when sundowning   GI/Hepatic negative GI ROS, Neg liver ROS,   Endo/Other  diabetes  Renal/GU      Musculoskeletal   Abdominal   Peds  Hematology negative hematology ROS (+)   Anesthesia Other Findings C-collar in place  Past Medical History: No date: Dementia ([place]) No date: Depression No date: Diabetes mellitus without complication ([facility]) No date: Erectile dysfunction No date: Hard of hearing No date: History of diverticulitis No date: History of kidney stones No date: Hyperlipidemia No date: Osteoporosis No date: Prostate cancer ([facility])  History reviewed. No pertinent surgical history.  BMI    Body Mass Index: 20.18 kg/m      Reproductive/Obstetrics negative OB ROS                           Anesthesia Physical Anesthesia Plan  ASA: II  Anesthesia Plan: General ETT   Post-op Pain Management:    Induction:   PONV Risk Score and Plan: Ondansetron, Dexamethasone and Treatment may vary due to age or medical condition  Airway Management Planned:   Additional Equipment: Arterial line  Intra-op Plan:   Post-operative Plan:   Informed Consent: I have  reviewed the patients History and Physical, chart, labs and discussed the procedure including the risks, benefits and alternatives for the proposed anesthesia with the patient or authorized representative who has indicated his/her understanding and acceptance.     Dental Advisory Given  Plan Discussed with: Anesthesiologist, CRNA and Surgeon  Anesthesia Plan Comments: (Permission for anesthesia obtained over the phone from pt's son and POA Bevan Disney on 06/03/20 at 1635.  Risks, benefits discussed and all questions answered.  KR)       Anesthesia Quick Evaluation

## 2020-06-03 NOTE — H&P (View-Only) (Signed)
Neurosurgery-New Consultation Evaluation 06/03/2020 Mitchell Stewart [id number]  Identifying Statement: Mitchell Stewart is a 79 y.o. male from [place] [address] with spine fracture  Physician Requesting Consultation: [place] [facility]  History of Present Illness: Mitchell Stewart is here after a fall and resulting spine fracture. He does endorse some neck pain. He also endorses some thumb numbness but otherwise denies weakness or numbness. He is a poor historian given dementia. Son was contacted who says he can become combative and impulsive and walks on his own and did fall down steps.   Past Medical History:  Past Medical History:  Diagnosis Date  . Dementia ([place])   . Depression   . Diabetes mellitus without complication ([place])   . Erectile dysfunction   . Hard of hearing   . History of diverticulitis   . History of kidney stones   . Hyperlipidemia   . Osteoporosis   . Prostate cancer [facility])     Social History: Social History   Socioeconomic History  . Marital status: Legally Separated    Spouse name: Not on file  . Number of children: Not on file  . Years of education: Not on file  . Highest education level: Not on file  Occupational History  . Not on file  Tobacco Use  . Smoking status: Never Smoker  . Smokeless tobacco: Never Used  Substance and Sexual Activity  . Alcohol use: Not on file  . Drug use: Not on file  . Sexual activity: Not on file  Other Topics Concern  . Not on file  Social History Narrative  . Not on file   Social Determinants of Health   Financial Resource Strain:   . Difficulty of Paying Living Expenses: Not on file  Food Insecurity:   . Worried About Charity fundraiser in the Last Year: Not on file  . Ran Out of Food in the Last Year: Not on file  Transportation Needs:   . Lack of Transportation (Medical): Not on file  . Lack of Transportation (Non-Medical): Not on file  Physical Activity:   . Days of Exercise per Week: Not on file  .  Minutes of Exercise per Session: Not on file  Stress:   . Feeling of Stress : Not on file  Social Connections:   . Frequency of Communication with Friends and Family: Not on file  . Frequency of Social Gatherings with Friends and Family: Not on file  . Attends Religious Services: Not on file  . Active Member of Clubs or Organizations: Not on file  . Attends Archivist Meetings: Not on file  . Marital Status: Not on file  Intimate Partner Violence:   . Fear of Current or Ex-Partner: Not on file  . Emotionally Abused: Not on file  . Physically Abused: Not on file  . Sexually Abused: Not on file   Living arrangements (living alone, with partner): Son has HCPOA  Family History: History reviewed. No pertinent family history.  Review of Systems:  Review of Systems - General ROS: Negative Psychological ROS: Negative Ophthalmic ROS: Negative ENT ROS: Negative Hematological and Lymphatic ROS: Negative  Endocrine ROS: Negative Respiratory ROS: Negative Cardiovascular ROS: Negative Gastrointestinal ROS: Negative Genito-Urinary ROS: Negative Musculoskeletal ROS: positive for neck pain Neurological ROS: Negative Dermatological ROS: Negative  Physical Exam: BP (!) 158/86   Pulse 73   Temp 98.1 F (36.7 C) (Oral)   Resp 18   Ht 5\' 6"  (1.676 m)   Wt 56.7 kg  SpO2 100%   BMI 20.18 kg/m  Body mass index is 20.18 kg/m. Body surface area is 1.62 meters squared. General appearance: Alert, cooperative, in no acute distress Head: Normocephalic, abrasion to forehead Eyes: Normal, EOM intact Oropharynx: Wearing facemask Neck: Supple, no tenderness, in cervical collar Ext: No edema in LE bilaterally  Neurologic exam:  Mental status: alertness: alert, disoriented affect: normal Speech: fluent and clear Motor:strength symmetric 4+/5 in grip and biceps, shoulder pain but is antigravity. He has 5/5 strength in lower extremities Sensory: intact to light touch in all  extremities Gait: not tested  Laboratory: Results for orders placed or performed during the hospital encounter of 06/03/20  Respiratory Panel by RT PCR (Flu A&B, Covid) - Nasopharyngeal Swab   Specimen: Nasopharyngeal Swab  Result Value Ref Range   SARS Coronavirus 2 by RT PCR NEGATIVE NEGATIVE   Influenza A by PCR NEGATIVE NEGATIVE   Influenza B by PCR NEGATIVE NEGATIVE  CBC with Differential/Platelet  Result Value Ref Range   WBC 12.1 (H) 4.0 - 10.5 K/uL   RBC 5.00 4.22 - 5.81 MIL/uL   Hemoglobin 14.7 13.0 - 17.0 g/dL   HCT 43.9 39 - 52 %   MCV 87.8 80.0 - 100.0 fL   MCH 29.4 26.0 - 34.0 pg   MCHC 33.5 30.0 - 36.0 g/dL   RDW 12.9 11.5 - 15.5 %   Platelets 138 (L) 150 - 400 K/uL   nRBC 0.0 0.0 - 0.2 %   Neutrophils Relative % 86 %   Neutro Abs 10.4 (H) 1.7 - 7.7 K/uL   Lymphocytes Relative 8 %   Lymphs Abs 1.0 0.7 - 4.0 K/uL   Monocytes Relative 5 %   Monocytes Absolute 0.6 0.1 - 1.0 K/uL   Eosinophils Relative 0 %   Eosinophils Absolute 0.0 0 - 0 K/uL   Basophils Relative 0 %   Basophils Absolute 0.0 0 - 0 K/uL   Immature Granulocytes 1 %   Abs Immature Granulocytes 0.09 (H) 0.00 - 0.07 K/uL  Basic metabolic panel  Result Value Ref Range   Sodium 139 135 - 145 mmol/L   Potassium 3.4 (L) 3.5 - 5.1 mmol/L   Chloride 105 98 - 111 mmol/L   CO2 20 (L) 22 - 32 mmol/L   Glucose, Bld 148 (H) 70 - 99 mg/dL   BUN 27 (H) 8 - 23 mg/dL   Creatinine, Ser 1.17 0.61 - 1.24 mg/dL   Calcium 9.0 8.9 - 10.3 mg/dL   GFR, Estimated 59 (L) >60 mL/min   Anion gap 14 5 - 15  Protime-INR  Result Value Ref Range   Prothrombin Time 12.7 11.4 - 15.2 seconds   INR 1.0 0.8 - 1.2  Glucose, capillary  Result Value Ref Range   Glucose-Capillary 146 (H) 70 - 99 mg/dL   I personally reviewed labs  Imaging: CT Cervical spine: Acute fracture of the cervical spine predominantly involving the posterior elements of C6 with fracture bilateral pedicles and minimal anterolisthesis of C6 upon C7.  The fracture pattern is suggestive of a hyperextension injury. Fractures also are identified involving the spinous process of C6, the left lamina of C6, and the left C7 superior articular facet extending into the left C6-7 facet which demonstrates mild relative widening.  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 Superimposed small epidural hematoma at the level of the fracture and at C5-6 slightly superiorly.  MRI Cervical Spine: 1. Motion degraded examination. 2. Known posterior element fractures at C6-7 with associated  mild marrow edema and moderate paraspinal soft tissue/posterior ligamentous complex edema. 3. Small dorsal epidural hematoma at C6-7 contributing to moderate spinal stenosis. 4. No convincing spinal cord signal abnormality. 5. 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  Impression/Plan:  Mitchell Repass has a C6/7 fracture that I believe to be unstable. I discussed with son need for fixation vs wearing collar and risk of nonunion with injury. He wants to proceed with surgery and we will plan on C6/7 ACDF. Alll risks including hematoma, infection, damage to spinal cord, dysphagia, and anesthesia were discussed.    1.  Diagnosis: C6/7 fracture   2.  Plan C6/7 ACDF today

## 2020-06-03 NOTE — ED Triage Notes (Signed)
Patient brought in by ems from home. Patient fell down a flight of steps. Patient with abrasion to forehead. Denies LOC. Patient with complaint of left side pain and bilateral arm pain. C-Collar placed by ems. Patient alert and oriented to person and place. Patient has a history of dementia.

## 2020-06-03 NOTE — Anesthesia Procedure Notes (Signed)
Arterial Line Insertion Start/End10/06/2020 6:00 PM, 06/03/2020 6:05 PM Performed by: Tera Mater, MD, anesthesiologist  Patient location: Pre-op. Preanesthetic checklist: patient identified, IV checked, site marked, risks and benefits discussed, surgical consent, monitors and equipment checked, pre-op evaluation, timeout performed and anesthesia consent Lidocaine 1% used for infiltration radial was placed Catheter size: 20 Fr Hand hygiene performed  and maximum sterile barriers used   Attempts: 2 Procedure performed using ultrasound guided technique. Following insertion, dressing applied. Post procedure assessment: normal and unchanged

## 2020-06-03 NOTE — Op Note (Signed)
Operative Note 06/03/2020  PRE-OP DIAGNOSIS: Cervical spine fracture  POST-OP DIAGNOSIS:Post-Op Diagnosis Codes: Cervical spine fracture  Procedure(s): ARTHRODESIS, ANTERIOR INTERBODY, INCLUDING DISC SPACE PREPARATION, DISCECTOMY, OSTEOPHYTECTOMY AND DECOMPRESSION OF SPINAL CORD AND/ORNERVE ROOTS; CERVICAL BELOW C2--C6-7ACDF ARTHRODESIS ANTERIOR INTERBODY W/DISCECTOMY ADDITIONAL FIFTH ANTERIOR INSTRUMENTATION; 2 TO 3 VERTEBRAL SEGMENTS (LIST IN ADDITION TO PRIMARY PROCEDURE) ALLOGRAFT, STRUCTURAL, FOR SPINE SURGERY ONLY (LIST IN ADDITION TO PRIMARY PROCEDURE)   SURGEON: Surgeon(s) and Role: * Malen Gauze, MD - Primary  Christine Zdeb,PA, Asisstant  ANESTHESIA:General   OPERATIVE FINDINGS: Fracture at C6-7  OPERATIVE REPORT:   Indications Mr. Baldwin presented to the ED on 10/11 with fall and fracture at C6/7.  Given this, we recommended an anterior cervical decompression and fusion.The risks of hematoma, infection, poor bone healing and failure of fusion, cord injury, weakness, numbness, neck pain, stroke,difficulty swallowingand death were discussed in detail. All questions were answered and the patient's son elected to proceed with the surgery given patient's baseline dementia.   Procedure After obtaining informed consent, the patient was taken to the Operating Room where general anesthesia was induced and the patient intubated. Vascular access was obtained. Decadron was administered. The head was slightly extended and imaging used to identify a skin crease overlying the C6vertebral body.Appropriate padding was performed. Neuromonitoring electrodes had been paced and MEPs and SSEPS were found.  The patient was prepped and draped in the usual sterile fashion and a timeout was performed per protocol. Local anesthesia was instilled with epinephrine along the planned incision site. A transverse cervicalincision was performed on the left in a skin  crease. The incision was carried to the level of the platysma and then cautery was used to incise the muscle. Blunt dissection was used to expand the plane and the dissection was carried deep medial to the SCM and carotid sheath being careful to identify the trachea and esophagus medially.The prevertebral fascia was dissected bluntly and the disc space visualized. A marker was placed and fluoroscopy confirmed the C6/7 disc level.   Next, cautery was used to undermine the longus colli muscles bilaterally and identify theC6/7 disc space. Caspar pins were placed at [place] a retractor system placed under the muscles to complete the exposure.The anterior osteophyte was removed. Next, a combination of curettes and Kerrison rongeurs were used to remove theremainder of theanterior osteophyte at C6/7and then the disc material. A 16mm matchstick was used to shave the endplates of the adjacent bodies. A trial spacer was used to size the graft and then hemostasis obtained. The microscope was brought into the field for the remainder of the surgery. The matchsitickdrill bitwas used to remove the osteophyte/disc complex deep to the level of the PLL at C6/7.Marland Kitchen There was significant disc protrusion at this level that was removed with rongeurs. The PLL was entered with a hook and then the PLL was removed along with remaining disc material to decompress centrally and then out into bilateral neuro foramen.  The dura appeared decompressed at this point.   Floseal was used for hemostasis. Allograft was placed,5mm in height and placed slightly recessed to theanterior edge of vertebral bodyto promote arthrodesis.  The caspar pins were removed and bone wax placed. The remainder of the osteophytes were removedto allow for plating. Next, a56mm plate was found to be the adequate size and was placed in the midline and secured with two 65mm screws at each bone level. Xray was obtained confirming good graft placement  and adequate depth of screws. The retractors were removed. The wound  was irrigated copiously and hemostasis obtained. The platysma was closed with 2-0 vicryl suture. The dermis was closed with 3-0 Vicryl and Dermabond was placed on the skin.  The patient had general anesthesia reversed and was extubated following the procedure. He awoke following commands with symmetric movement. He was taken to the PACU where he continued his recovery.  ESTIMATED BLOOD LOSS:50cc   SPECIMENS:None    IMPLANT ALLOGRAFT [employer] CC [id number] - [id number]  Inventory Item: ALLOGRAFT [employer] CC [id number] Serial no.: [id number] Model/Cat no.: [id number]  Implant name: ALLOGRAFT LORDOTIC CC [id number] - [id number] Laterality: N/A Area: Cervical Level 6-7  Manufacturer: [employer] Date of Manufacture:    Action: Implanted Number Used: 1   Device Identifier:  Device Identifier Type:    PLATE ANT CERV XTEND 1 LV 14 - [id number]  Inventory Item: PLATE ANT CERV XTEND 1 LV 14 Serial no.:  Model/Cat no.: [id number]  Implant name: PLATE ANT CERV XTEND 1 LV 14 - [id number] Laterality: N/A Area: Cervical Level 6-7  Manufacturer: [place] Date of Manufacture:    Action: Implanted Number Used: 1   Device Identifier:  Device Identifier Type:    SCREW XTD VAR 4.2 SELF TAP - [id number]  Inventory Item: SCREW XTD VAR 4.2 SELF TAP Serial no.:  Model/Cat no.: [id number]  Implant name: SCREW XTD VAR 4.2 SELF TAP - [id number] Laterality: N/A Area: Cervical Level 6-7  Manufacturer: [place] Date of Manufacture:    Action: Implanted Number Used: 4   Device Identifier:  Device Identifier Type:        ATTESTATION: I performed the procedure in its entirety with the assistance ofChristine Zdeb, physician assistant                          Deetta Perla, MD                         [phone]

## 2020-06-03 NOTE — ED Notes (Signed)
Patient to MRI.

## 2020-06-03 NOTE — ED Notes (Signed)
Pt not tolerating condom cath. This RN took condom cath off of patient and placed brief on patient at this time. Pt remains in NAD

## 2020-06-04 ENCOUNTER — Inpatient Hospital Stay: Payer: Medicare (Managed Care)

## 2020-06-04 ENCOUNTER — Ambulatory Visit: Payer: Self-pay | Admitting: Family

## 2020-06-04 ENCOUNTER — Encounter: Payer: Self-pay | Admitting: Neurosurgery

## 2020-06-04 DIAGNOSIS — F0390 Unspecified dementia without behavioral disturbance: Secondary | ICD-10-CM | POA: Diagnosis not present

## 2020-06-04 DIAGNOSIS — S0083XA Contusion of other part of head, initial encounter: Secondary | ICD-10-CM | POA: Diagnosis not present

## 2020-06-04 DIAGNOSIS — S129XXA Fracture of neck, unspecified, initial encounter: Secondary | ICD-10-CM | POA: Diagnosis not present

## 2020-06-04 LAB — GLUCOSE, CAPILLARY
Glucose-Capillary: 96 mg/dL (ref 70–99)
Glucose-Capillary: 156 mg/dL — ABNORMAL HIGH (ref 70–99)
Glucose-Capillary: 111 mg/dL — ABNORMAL HIGH (ref 70–99)
Glucose-Capillary: 103 mg/dL — ABNORMAL HIGH (ref 70–99)
Glucose-Capillary: 107 mg/dL — ABNORMAL HIGH (ref 70–99)
Glucose-Capillary: 223 mg/dL — ABNORMAL HIGH (ref 70–99)
Glucose-Capillary: 190 mg/dL — ABNORMAL HIGH (ref 70–99)

## 2020-06-04 LAB — HEMOGLOBIN A1C
Hgb A1c MFr Bld: 7.6 % — ABNORMAL HIGH (ref 4.8–5.6)
Mean Plasma Glucose: 171.42 mg/dL

## 2020-06-04 MED ORDER — ACETAMINOPHEN 325 MG PO TABS
650.0000 mg | ORAL_TABLET | Freq: Four times a day (QID) | ORAL | Status: DC | PRN
  Administered 2020-06-04 – 2020-06-07 (×3): 650 mg via ORAL
  Filled 2020-06-04 (×3): qty 2

## 2020-06-04 MED ORDER — SODIUM CHLORIDE 0.9 % IV SOLN
INTRAVENOUS | Status: DC | PRN
  Administered 2020-06-04: 1000 mL via INTRAVENOUS

## 2020-06-04 MED ORDER — OXYCODONE HCL 5 MG PO TABS
5.0000 mg | ORAL_TABLET | Freq: Four times a day (QID) | ORAL | Status: DC | PRN
  Administered 2020-06-04 – 2020-06-19 (×7): 5 mg via ORAL
  Filled 2020-06-04 (×7): qty 1

## 2020-06-04 MED ORDER — TRAMADOL HCL 50 MG PO TABS
50.0000 mg | ORAL_TABLET | Freq: Four times a day (QID) | ORAL | Status: DC | PRN
  Administered 2020-06-07 – 2020-06-19 (×7): 50 mg via ORAL
  Filled 2020-06-04 (×7): qty 1

## 2020-06-04 MED ORDER — TAMSULOSIN HCL 0.4 MG PO CAPS
0.4000 mg | ORAL_CAPSULE | Freq: Every day | ORAL | Status: DC
  Administered 2020-06-05 – 2020-06-20 (×15): 0.4 mg via ORAL
  Filled 2020-06-04 (×16): qty 1

## 2020-06-04 MED ORDER — SERTRALINE HCL 50 MG PO TABS
150.0000 mg | ORAL_TABLET | Freq: Every day | ORAL | Status: DC
  Administered 2020-06-04 – 2020-06-20 (×16): 150 mg via ORAL
  Filled 2020-06-04 (×18): qty 3

## 2020-06-04 NOTE — Anesthesia Postprocedure Evaluation (Signed)
Anesthesia Post Note  Patient: Mitchell Stewart  Procedure(s) Performed: ANTERIOR CERVICAL DECOMPRESSION/DISCECTOMY FUSION 1 LEVEL C6/7 (N/A )  Patient location during evaluation: PACU Anesthesia Type: General Level of consciousness: awake and alert Pain management: pain level controlled Vital Signs Assessment: post-procedure vital signs reviewed and stable Respiratory status: spontaneous breathing, nonlabored ventilation and respiratory function stable Cardiovascular status: blood pressure returned to baseline and stable Postop Assessment: no apparent nausea or vomiting Anesthetic complications: no Comments:  Pt not initially moving LUE and slow to emerge from anesthesia.  Per Neurosurgery was given Narcan, sent for CT head/C-spine and ABG drawn.  Pt likely just slow to emerge given underlying dementia, also he only intermittently follows commands at baseline.  After returning from CT he was moving all extremities and answering questions appropriately by nodding or shaking head.  VSS.  Deemed stable for PACU discharge.   No complications documented.   Last Vitals:  Vitals:   06/04/20 0045 06/04/20 0203  BP: (!) 142/86 (!) 151/80  Pulse: (!) 101 93  Resp: 18 18  Temp: 36.9 C 37.1 C  SpO2: 100% 100%    Last Pain:  Vitals:   06/04/20 0203  TempSrc: Oral  PainSc:                  Tera Mater

## 2020-06-04 NOTE — Evaluation (Signed)
Occupational Therapy Evaluation Patient Details Name: Mitchell Stewart MRN: [id number] DOB: 1941/04/03 Today's Date: 06/04/2020    History of Present Illness Pt is a 79 y/o M with PMH: dementia, DM, HOH, HLD and prostate cancer who presented to [facility] ED s/p fall down flight of stairs. Pt found to have cervical spine fx and is now s/p C6-7 ACDF (06/03/2020).   Clinical Impression   Pt was seen for OT evaluation this date. Prior to hospital admission, pt was INDEP with basic fxl mobility but demo'ed shuffling pattern and required CGA for assist with steps/stairs from son. In addition, pt required intermittent assist for bathing/dressing, more d/t safety awareness/cognition/difficulty sequencing than need for physical assistance. Pt was in memory care unit until 05/23/20 and son brought him home. Currently pt demonstrates impairments as described below (See OT problem list) which functionally limit his ability to perform ADL/self-care tasks. OT facilitates pt particiaption in sup to sit with MAX A, and MAX A with attempt to CTS as well as MOD tactile cues for safe hand placement. Pt with very limited ability to tolerate and goes back to sitting within <5 seconds. Pt with decreased safety awareness. OT attempts to educate but pt with poor carryover for new learning. OT attempts to engage pt in UB/LB dressing. He is not able to tolerate meaningful participation in LB dressing d/t pain and poor sitting balance, requries MAX/TOTAL A. And MOD A with MOD verbal/tactile cues to sequence for UB dressing at bed level with HOB elevated. In addition, MAX A with UB ADLs including dressing and oral care with MOD cues to sequence. Pt would benefit from skilled OT services to address noted impairments and functional limitations (see below for any additional details) in order to maximize safety and independence while minimizing falls risk and caregiver burden. Upon hospital discharge, recommend STR to maximize pt safety and  return to PLOF.      Follow Up Recommendations  SNF;Supervision/Assistance - 24 hour    Equipment Recommendations   (TBD/defer to next level of care. Anticipate at least 2WW)    Recommendations for Other Services       Precautions / Restrictions Precautions Precautions: Fall;Cervical Required Braces or Orthoses: Cervical Brace Cervical Brace: Hard collar Restrictions Other Position/Activity Restrictions: [place] J collar, HOB 30 degrees      Mobility Bed Mobility Overal bed mobility: Needs Assistance Bed Mobility: Supine to Sit;Sit to Supine     Supine to sit: Max assist Sit to supine: Max assist      Transfers Overall transfer level: Needs assistance Equipment used: Rolling walker (2 wheeled) Transfers: Sit to/from Stand Sit to Stand: Max assist;From elevated surface         General transfer comment: significant assist to bring feet closer to his body in EOB sitting in prep for attempt to CTS to establish BOS. Pt with significant posterior lean in standing, LEs bracing on the bed.    Balance Overall balance assessment: Needs assistance   Sitting balance-Leahy Scale: Poor Sitting balance - Comments: Intermittent short bouts of maintaining static sit with SBA, requires MIN A primarily to maintain static sitting. Postural control: Left lateral lean   Standing balance-Leahy Scale: Zero Standing balance comment: MAX A for static stand for <5 seconds with RW for UE support                           ADL either performed or assessed with clinical judgement   ADL Overall ADL's :  Needs assistance/impaired                                       General ADL Comments: MAX A for UB ADLs including oral care and dressing, MAX/TOTAL A for LB ADLs including attempt to don socks-ultimately performed by OT. Pt requires MAX A for bed mobility and MAX A for ADL transfers from elevated surface.     Vision Baseline Vision/History: Cataracts Patient  Visual Report: No change from baseline Additional Comments: son reports pt technically needs cataract surgery and they were told "glasses can't help him"     Perception     Praxis      Pertinent Vitals/Pain Pain Assessment: Faces Faces Pain Scale: Hurts even more Pain Location: L side, UE and neck with mobilization attempts. Pain Descriptors / Indicators: Grimacing;Sore Pain Intervention(s): Limited activity within patient's tolerance;Monitored during session;Repositioned;Patient requesting pain meds-RN notified (supported L side with pillows in bed as pt with L lateral lean.)     Hand Dominance     Extremity/Trunk Assessment Upper Extremity Assessment Upper Extremity Assessment: RUE deficits/detail;LUE deficits/detail RUE Deficits / Details: shld flexion to 1/2 range, MMT not assessed d/t pain/precautions. Grip MMT grossly 4-/5 LUE Deficits / Details: shld flexion to 1/3 range, MMT not assessed d/t pain/precautions. Grip MMT grossly 3+/5. Increased grimmacing with attempts to move L UE. Apparently fell to L side.   Lower Extremity Assessment Lower Extremity Assessment: Defer to PT evaluation;Generalized weakness       Communication Communication Communication: HOH   Cognition Arousal/Alertness:  (somewhat drowsy intermittently) Behavior During Therapy: WFL for tasks assessed/performed;Flat affect Overall Cognitive Status: History of cognitive impairments - at baseline                                 General Comments: Pt oriented to self, month and year. Not place or situation and unable to provide good background/PLOF information. Son called to verify info. Pt able to follow simple one step commands inconsistently/with increased time. Pt demos poor motor planning. Requires MIN/MOD verbal/tactile cues.   General Comments       Exercises Other Exercises Other Exercises: OT facilitates pt particiaption in sup to sit with MAX A, and MAX A with attempt to CTS as  well as MOD tactile cues for safe hand placement. Pt with very limited ability to tolerate and goes back to sitting within <5 seconds. Pt with decreased safety awareness. OT attempts to educate but pt with poor carryover for new learning. OT attempts to engage pt in UB/LB dressing. He is not able to tolerate meaningful participation in LB dressing d/t pain and poor sitting balance, requries MAX/TOTAL A. And MOD A with MOD verbal/tactile cues to sequence for UB dressing at bed level with HOB elevated. In addition, MAX A with UB ADLs including dressing and oral care with MOD cues to sequence.   Shoulder Instructions      Home Living Family/patient expects to be discharged to:: Private residence (was in memory care-[facility] 9/30 when son brought pt home to live with him 2/2 finances) Living Arrangements: Children Available Help at Discharge: Family;Available PRN/intermittently (son works from home, but needs to be able to be present on the computer.) Type of Home: House Home Access: Stairs to enter [employer] of Steps: 2 Entrance Stairs-Rails: None Home Layout: Two level;Bed/bath upstairs  Alternate Level Stairs-Number of Steps: 15 Alternate Level Stairs-Rails: Right Bathroom Shower/Tub: Teacher, early years/pre: Standard     Home Equipment: Environmental consultant - 4 wheels;Shower seat (son ordered shower seat for patient that has not yet arrived)          Prior Functioning/Environment Level of Independence: Needs assistance  Gait / Transfers Assistance Needed: "shuffling" walking pattern per son-Emett Jr. Son bought Rollator, but pt not really using. Requires CGA for mobility up stairs. Son barricaded stairs, but pt somehow removed heavy barricade leading to this fall. ADL's / Homemaking Assistance Needed: Some intermittent assist for bathing/dressing d/t waxing/waning cognition, but can usually dress himself. Son indicates pt should be helped with bathing for safety,  but often declines assistance. Son assists for all [facility] IADLs such as cooking/cleaning.   Comments: >5 falls in 2 weeks.        OT Problem List: Decreased strength;Decreased range of motion;Decreased activity tolerance;Impaired balance (sitting and/or standing);Impaired vision/perception;Decreased cognition;Decreased safety awareness;Decreased knowledge of use of DME or AE;Decreased knowledge of precautions;Impaired UE functional use;Pain      OT Treatment/Interventions: Self-care/ADL training;DME and/or AE instruction;Therapeutic activities;Balance training;Therapeutic exercise;Patient/family education    OT Goals(Current goals can be found in the care plan section) Acute Rehab OT Goals Patient Stated Goal: goal to get him stronger/safer OT Goal Formulation: With family Time For Goal Achievement: 06/18/20 Potential to Achieve Goals: Fair ADL Goals Pt Will Perform Grooming: with min guard assist;sitting (to perform 1-2 g/h tasks to increase seated balance/tolerance) Pt Will Perform Upper Body Dressing: with supervision;sitting (with modified technique as needed) Pt Will Perform Lower Body Dressing: with min assist;with adaptive equipment;sit to/from stand Pt Will Transfer to Toilet: with min assist;stand pivot transfer;bedside commode Pt Will Perform Toileting - Clothing Manipulation and hygiene: with min assist;sit to/from stand  OT Frequency: Min 1X/week   Barriers to D/C: Decreased caregiver support;Inaccessible home environment  flight of stairs and while son is home all day, he does need to work from home       Co-evaluation              AM-PAC OT "6 Clicks" Daily Activity     Outcome Measure Help from another person eating meals?: A Lot Help from another person taking care of personal grooming?: A Lot Help from another person toileting, which includes using toliet, bedpan, or urinal?: A Lot Help from another person bathing (including washing, rinsing, drying)?: A  Lot Help from another person to put on and taking off regular upper body clothing?: A Lot Help from another person to put on and taking off regular lower body clothing?: Total 6 Click Score: 11   End of Session Equipment Utilized During Treatment: Gait belt;Rolling walker;Cervical collar;Oxygen Nurse Communication: Mobility status;Other (comment) (notified that L side weaker)  Activity Tolerance: Patient limited by fatigue;Patient limited by pain;Other (comment) (somewhat limited by cognition) Patient left: in bed;with call bell/phone within reach;with bed alarm set;Other (comment);with SCD's reapplied (HOB >30 degrees)  OT Visit Diagnosis: Unsteadiness on feet (R26.81);Other abnormalities of gait and mobility (R26.89);Repeated falls (R29.6);Muscle weakness (generalized) (M62.81);History of falling (Z91.81)                Time: 9417-4081 OT Time Calculation (min): 49 min Charges:  OT General Charges $OT Visit: 1 Visit OT Evaluation $OT Eval Moderate Complexity: 1 Mod OT Treatments $Self Care/Home Management : 8-22 mins $Therapeutic Activity: 8-22 mins  Gerrianne Scale, MS, OTR/L ascom [phone] 06/04/20, 10:52 AM

## 2020-06-04 NOTE — Evaluation (Signed)
Physical Therapy Evaluation Patient Details Name: Mitchell Stewart MRN: [id number] DOB: [birth year] Today's Date: 06/04/2020   History of Present Illness  Pt is a 79 y/o M with PMH: dementia, DM, HOH, HLD and prostate cancer who presented to [facility] ED s/p fall down flight of stairs. Pt found to have cervical spine fx and is now s/p C6-7 ACDF (06/03/2020).  Clinical Impression  Upon arrival, pt was pleasant and motivated to participate during the session. Pt was alert, but could not state name, birthday, date, location, or situation. Pt was able to answer some person questions and let the PT know what he would like to be called. PT attempted to facilitate mobilization but pt became very agitated. Pt threatened to call the cops, screamed for help, threatened PT with knife, and began cussing. Pt did attempt to get out of bed and was able to move bilateral LE to the side of the bed, but was unable to move trunk to seated position. Pt demonstrated 3/5 bilateral LE hip and knee flexion strength while moving legs away from PT when PT attempted to reposition pt. Pt demonstrated 3/5 strength for bilateral UE strength when pt reached over head. Assessment was limited due to agitation but functional mobility deficits were informally observed. Pt will benefit from PT services in a SNF setting upon discharge to safely address deficits listed in patient problem list for decreased caregiver assistance and eventual return to PLOF.      Follow Up Recommendations SNF    Equipment Recommendations  Rolling walker with 5" wheels    Recommendations for Other Services       Precautions / Restrictions Precautions Precautions: Fall;Cervical Required Braces or Orthoses: Cervical Brace Cervical Brace: Hard collar Restrictions Other Position/Activity Restrictions: [place] J collar, HOB 30 degrees      Mobility  Bed Mobility Overal bed mobility: Needs Assistance Bed Mobility: Supine to Sit        General bed  mobility comments: pt refused coming to sit but pt was observed to be able to move his legs off the bed mutiple times with ease. Pt unable to fully move trunk and likely requires help to come to sitting. Pt demonstrates ability move move bilateral LE with at least 3/5 strength  Transfers       General transfer comment: pt refused to get of bed and became very agitated. Not attempted  Ambulation/Gait             General Gait Details: not attempted due to increased agitation  Stairs            Wheelchair Mobility    Modified Rankin (Stroke Patients Only)         Pertinent Vitals/Pain Pain Assessment: Faces Faces Pain Scale: Hurts little more Pain Location: Stomach pain Pain Descriptors / Indicators: Grimacing;Sore Pain Intervention(s): Monitored during session;Limited activity within patient's tolerance    Home Living Family/patient expects to be discharged to:: Private residence (was in memory care-[facility] 9/30 when son brought pt home to live with him 2/2 finances) Living Arrangements: Children Available Help at Discharge: Family;Available PRN/intermittently (son works from home, but needs to be able to be present on the computer.) Type of Home: House Home Access: Stairs to enter Entrance Stairs-Rails: None Entrance Stairs-Number of Steps: 2 Home Layout: Two level;Bed/bath upstairs Home Equipment: [place] - 4 wheels;Shower seat (son ordered shower seat for patient that has not yet arrived)      Prior Function Level of Independence: Needs assistance  Gait / Transfers Assistance Needed: "shuffling" walking pattern per son-Firas Jr. Son bought Rollator, but pt not really using. Requires CGA for mobility up stairs. Son barricaded stairs, but pt somehow removed heavy barricade leading to this fall.  ADL's / Homemaking Assistance Needed: Some intermittent assist for bathing/dressing d/t waxing/waning cognition, but can usually dress himself. Son  indicates pt should be helped with bathing for safety, but often declines assistance. Son assists for all [facility] IADLs such as cooking/cleaning.  Comments: >5 falls in 2 weeks.     Hand Dominance        Extremity/Trunk Assessment   Upper Extremity Assessment Upper Extremity Assessment: RUE deficits/detail;LUE deficits/detail RUE Deficits / Details: shld flexion to 1/2 range, MMT not assessed d/t pain/precautions. Grip MMT grossly 4-/5 LUE Deficits / Details: shld flexion to 1/3 range, MMT not assessed d/t pain/precautions. Grip MMT grossly 3+/5. Increased grimmacing with attempts to move L UE. Apparently fell to L side.    Lower Extremity Assessment Lower Extremity Assessment: Defer to PT evaluation;Generalized weakness       Communication   Communication: HOH  Cognition Arousal/Alertness: Awake/alert Behavior During Therapy: Agitated Overall Cognitive Status: History of cognitive impairments - at baseline                                 General Comments: pt alert but unable to state name, birthday, location, or date. Pt does not believe that he fell and broke his neck. Pt thinks he is here for "someone to make some money". Pt was plesant initially but became very agitated when PT attempted to facilitate movement. pt threthened to call the cops and get his knife      General Comments      Exercises Other Exercises Other Exercises: pt extensively educated on the improtance of mobility and the benefits of evercise Other Exercises: pt educated on cervical precautions and how to move body to look at an object   Assessment/Plan    PT Assessment Patient needs continued PT services  PT Problem List Decreased mobility;Decreased safety awareness;Decreased knowledge of precautions;Decreased balance;Decreased strength;Decreased knowledge of use of DME       PT Treatment Interventions DME instruction;Therapeutic exercise;Gait training;Balance training;Stair  training;Functional mobility training;Therapeutic activities;Patient/family education    PT Goals (Current goals can be found in the Care Plan section)  Acute Rehab PT Goals Patient Stated Goal: goal to get him stronger/safer PT Goal Formulation: Patient unable to participate in goal setting Time For Goal Achievement: 06/17/20 Potential to Achieve Goals: Fair    Frequency BID   Barriers to discharge        Co-evaluation               AM-PAC PT "6 Clicks" Mobility  Outcome Measure Help needed turning from your back to your side while in a flat bed without using bedrails?: A Lot Help needed moving from lying on your back to sitting on the side of a flat bed without using bedrails?: A Lot Help needed moving to and from a bed to a chair (including a wheelchair)?: A Lot Help needed standing up from a chair using your arms (e.g., wheelchair or bedside chair)?: A Lot Help needed to walk in hospital room?: A Lot Help needed climbing 3-5 steps with a railing? : A Lot 6 Click Score: 12    End of Session Equipment Utilized During Treatment: Oxygen Activity Tolerance: Treatment limited secondary to agitation Patient  left: in bed;with call bell/phone within reach;with bed alarm set;with SCD's reapplied Nurse Communication: Mobility status (nursing notified about agitation and to reposistion pt when pt is less agitated) PT Visit Diagnosis: Muscle weakness (generalized) (M62.81);Unsteadiness on feet (R26.81);History of falling (Z91.81)    Time: 3014-9969 PT Time Calculation (min) (ACUTE ONLY): 30 min   Charges:              Hervey Ard, SPT 06/04/20. 11:18 AM

## 2020-06-04 NOTE — Progress Notes (Signed)
   Progress Note   Date: 06/04/2020  Mitchell Stewart is POD#1 from C6/7 ACDF  Overnight Events: Surgery was uncomplicated. Patient slow to wake up from anesthesia. CT of head and cervical spine unremarkable.  This morning, he appears to be at baseline with confusion. He is not endorsing any severe neck pain but does have continued shoulder pain he presented with.   Vital Signs: Temp:  [97.6 F (36.4 C)-98.9 F (37.2 C)] 98.5 F (36.9 C) (10/12 0801) Pulse Rate:  [88-115] 103 (10/12 0801) Resp:  [13-24] 18 (10/12 0801) BP: (140-183)/(80-115) 140/87 (10/12 0801) SpO2:  [88 %-100 %] 98 % (10/12 0801) Temp (24hrs), Avg:98.4 F (36.9 C), Min:97.6 F (36.4 C), Max:98.9 F (37.2 C)  Weight: 56.7 kg  Problem List Patient Active Problem List   Diagnosis Date Noted  . Traumatic fracture of cervical spine ([place]) 06/03/2020  . Dementia without behavioral disturbance ([place])   . Forehead contusion     Medications: Scheduled Meds: . Chlorhexidine Gluconate Cloth  6 each Topical Daily  . enoxaparin (LOVENOX) injection  40 mg Subcutaneous Q24H  . insulin aspart  0-9 Units Subcutaneous Q4H  . insulin glargine  7 Units Subcutaneous Daily  . pravastatin  20 mg Oral q1800  . QUEtiapine  25 mg Oral QHS  . tamsulosin  0.4 mg Oral Daily   Continuous Infusions: . sodium chloride 50 mL/hr at 06/03/20 2115  . acetaminophen 1,000 mg (06/04/20 0619)  . methocarbamol (ROBAXIN) IV 500 mg (06/04/20 0256)   PRN Meds:.magnesium hydroxide, methocarbamol (ROBAXIN) IV, ondansetron **OR** ondansetron (ZOFRAN) IV  Labs:  Lab Results  Component Value Date   WBC 11.1 (H) 06/03/2020   WBC 12.1 (H) 06/03/2020   HCT 42.2 06/03/2020   HCT 43.9 06/03/2020   PLT 158 06/03/2020   PLT 138 (L) 06/03/2020    Lab Results  Component Value Date   INR 1.0 06/03/2020   Lab Results  Component Value Date   NA 138 06/03/2020   NA 139 06/03/2020   K 3.8 06/03/2020   K 3.4 (L) 06/03/2020   BUN 15 06/03/2020    BUN 27 (H) 06/03/2020   No results found for: MG  Exam: Neurologic exam:  Mental status: alertness: alert, disoriented affect: normal Speech: fluent and clear Neck: Incision flat, Collar on Motor:strength symmetric 4/5 in grip and 5/5 in biceps and triceps at full effort, shoulder pain but is antigravity. He has 5/5 strength in lower extremity hip flexion, dorsiflexion, plantar flexion Sensory: intact to light touch in all extremities Gait: not tested  A/P: Mitchell Stewart is POD#1 from C6/7 ACDF for cervical spine fracture  #s/p ACDF - Continue [place] J collar at all time - OK to work with physical therapy - Cleared for DVT prophylaxis - Will plan f/u in clinic in 3 weeks - Patient will need cervical spine xrays before discharge (Ordered)  Patient will need long term placement given injury and dementia preventing discharge home.     Mitchell Perla, MD

## 2020-06-04 NOTE — Progress Notes (Signed)
Physical Therapy Treatment Patient Details Name: BENUEL LY MRN: [id number] DOB: May 31, 1941 Today's Date: 06/04/2020    History of Present Illness Pt is a 79 y/o M with PMH: dementia, DM, HOH, HLD and prostate cancer who presented to [facility] ED s/p fall down flight of stairs. Pt found to have cervical spine fx and is now s/p C6-7 ACDF (06/03/2020).    PT Comments    Pt was pleasant and alert. Pt was oriented to self and was aware that he broke his neck. Pt needed frequent redirecting to complete therapy. Pt required total A to come from supine to sitting EOB. Cognition may have played a role as the pt does not follow commands consistently at baseline. Pt required max verbal and tactile cues to perform STS but only required Min guard to complete. Pt self initiated sitting secondary to stomach pain but agreed to stand and walk again. Pt able to ambulate 3' to chair but required ModA to prevent LOB. Pt couched to perform trunk flexion and rotation with shoulder ROM, but pt was not able to lift arms above 75 degrees flexion and demonstrates 2/5 strength in bilateral shoulders and triceps. Pt will benefit from PT services in a SNF setting upon discharge to safely address deficits listed in patient problem list for decreased caregiver assistance and eventual return to PLOF.   Follow Up Recommendations  SNF     Equipment Recommendations  Rolling walker with 5" wheels    Recommendations for Other Services       Precautions / Restrictions Precautions Precautions: Fall;Cervical Precaution Comments: Brace on at all times Required Braces or Orthoses: Cervical Brace Cervical Brace: Hard collar Restrictions Other Position/Activity Restrictions: [place] J collar, HOB 30 degrees    Mobility  Bed Mobility Overal bed mobility: Needs Assistance Bed Mobility: Supine to Sit     Supine to sit: Total assist     General bed mobility comments: pt did not participate in sitting EOB despite max cueing.  Pt required total assist to sit EOB  Transfers Overall transfer level: Needs assistance   Transfers: Sit to/from Stand Sit to Stand: Min guard         General transfer comment: pt needs max verbal and tactile cueing to complete  Ambulation/Gait Ambulation/Gait assistance: Mod assist Gait Distance (Feet): 3 Feet Assistive device: Rolling walker (2 wheeled) Gait Pattern/deviations: Trunk flexed;Decreased stride length     General Gait Details: pt was very unsteady with gait. pt displayd flexed posture and required tactile cueing to bring the walker cloer. pt does not have safety awareness   Stairs             Wheelchair Mobility    Modified Rankin (Stroke Patients Only)       Balance Overall balance assessment: Needs assistance Sitting-balance support: Feet unsupported Sitting balance-Leahy Scale: Fair Sitting balance - Comments: pt able to maintain statis sitting balance but can not tolereate any weight shifting or challenges to balance     Standing balance-Leahy Scale: Poor Standing balance comment: pt requires min-mod A and RW to remain upright                            Cognition Arousal/Alertness: Awake/alert Behavior During Therapy: WFL for tasks assessed/performed Overall Cognitive Status: History of cognitive impairments - at baseline  General Comments: pt alert but not oriented to date or location. Knows that he fell and broke his neck      Exercises Total Joint Exercises Straight Leg Raises: AROM;Both;5 reps Long Arc Quad: AROM;Both;10 reps Other Exercises Other Exercises: pt performs forward trunk flexion with shoulder flexion x5 Other Exercises: pt performs L and R trunk rotation with shoulder ROM x 10    General Comments        Pertinent Vitals/Pain Faces Pain Scale: Hurts little more Pain Location: Stomach pain Pain Descriptors / Indicators: Grimacing;Sore Pain  Intervention(s): Limited activity within patient's tolerance;Monitored during session;Repositioned    Home Living Family/patient expects to be discharged to:: Private residence (was in memory care-[facility] 9/30 when son brought pt home to live with him 2/2 finances) Living Arrangements: Children Available Help at Discharge: Family;Available PRN/intermittently (son works from home, but needs to be able to be present on the computer.) Type of Home: House Home Access: Stairs to enter Entrance Stairs-Rails: None Home Layout: Two level;Bed/bath upstairs Home Equipment: Blum - 4 wheels;Shower seat (son ordered shower seat for patient that has not yet arrived)      Prior Function Level of Independence: Needs assistance  Gait / Transfers Assistance Needed: "shuffling" walking pattern per son-Eleuterio Jr. Son bought Rollator, but pt not really using. Requires CGA for mobility up stairs. Son barricaded stairs, but pt somehow removed heavy barricade leading to this fall. ADL's / Homemaking Assistance Needed: Some intermittent assist for bathing/dressing d/t waxing/waning cognition, but can usually dress himself. Son indicates pt should be helped with bathing for safety, but often declines assistance. Son assists for all [facility] IADLs such as cooking/cleaning. Comments: >5 falls in 2 weeks.   PT Goals (current goals can now be found in the care plan section) Acute Rehab PT Goals PT Goal Formulation: Patient unable to participate in goal setting Time For Goal Achievement: 06/17/20 Potential to Achieve Goals: Fair    Frequency    BID      PT Plan Current plan remains appropriate    Co-evaluation              AM-PAC PT "6 Clicks" Mobility   Outcome Measure  Help needed turning from your back to your side while in a flat bed without using bedrails?: Total Help needed moving from lying on your back to sitting on the side of a flat bed without using bedrails?: Total Help needed  moving to and from a bed to a chair (including a wheelchair)?: A Little Help needed standing up from a chair using your arms (e.g., wheelchair or bedside chair)?: A Lot Help needed to walk in hospital room?: A Lot Help needed climbing 3-5 steps with a railing? : A Lot 6 Click Score: 11    End of Session Equipment Utilized During Treatment: Oxygen;Gait belt Activity Tolerance: Treatment limited secondary to agitation Patient left: with SCD's reapplied;in chair;with call bell/phone within reach;with chair alarm set Nurse Communication: Mobility status (nursing notified about agitation and to reposistion pt when pt is less agitated) PT Visit Diagnosis: Muscle weakness (generalized) (M62.81);Unsteadiness on feet (R26.81);History of falling (Z91.81)     Time: 2426-8341 PT Time Calculation (min) (ACUTE ONLY): 38 min  Charges:                        Hervey Ard, SPT 06/04/20. 2:51 PM

## 2020-06-04 NOTE — Progress Notes (Addendum)
[facility] NAME: Mitchell Stewart    MR#:  [id number]  DATE OF BIRTH:  28-Jun-1941  SUBJECTIVE:  postop day one patient worked elevated occupational therapy. Does not want to eat. He is confused. No family in the room REVIEW OF SYSTEMS:   Review of Systems  Unable to perform ROS: Dementia   Tolerating Diet: Tolerating PT: rehab  DRUG ALLERGIES:  No Known Allergies  VITALS:  Blood pressure 140/85, pulse (!) 103, temperature 98.6 F (37 C), temperature source Oral, resp. rate 18, height 5\' 6"  (1.676 m), weight 56.7 kg, SpO2 96 %.  PHYSICAL EXAMINATION:   Physical Exam  GENERAL:  79 y.o.-year-old patient lying in the bed with no acute distress. Thin, friale NECK: neck collar+ LUNGS: Normal breath sounds bilaterally, no wheezing, rales, rhonchi. No use of accessory muscles of respiration.  CARDIOVASCULAR: S1, S2 normal. No murmurs, rubs, or gallops.  ABDOMEN: Soft, nontender, nondistended. Bowel sounds present. No organomegaly or mass.  EXTREMITIES: No cyanosis, clubbing or edema b/l.    NEUROLOGIC: grossly nonfocal. Moves all extremities well.  PSYCHIATRIC:  patient is awake. Confused at baseline. Has dementia. SKIN: per RN documentation  LABORATORY PANEL:  CBC Recent Labs  Lab 06/03/20 2227  WBC 11.1*  HGB 14.1  HCT 42.2  PLT 158    Chemistries  Recent Labs  Lab 06/03/20 2227  NA 138  K 3.8  CL 105  CO2 19*  GLUCOSE 151*  BUN 15  CREATININE 0.85  CALCIUM 8.2*   Cardiac Enzymes No results for input(s): TROPONINI in the last 168 hours. RADIOLOGY:  DG Cervical Spine 2 or 3 views  Result Date: 06/03/2020 CLINICAL DATA:  Cervical fusion EXAM: CERVICAL SPINE - 2-3 VIEW; DG C-ARM 1-60 MIN COMPARISON:  MRI from earlier in the same day. FLUOROSCOPY TIME:  Radiation Exposure Index (as provided by the fluoroscopic device): Not available If the device does not provide the exposure index: Fluoroscopy Time: 6 seconds  Number of Acquired Images:  2 FINDINGS: Initial image demonstrates a surgical instrument anterior to the cervical spine at the C6-7 level. Mild anterolisthesis of C6 on C7 is seen. Subsequent interbody fusion and anterior fixation is noted at C6-7. IMPRESSION: Interbody fusion at C6-7 with anterior fixation Electronically Signed   By: Inez Catalina M.D.   On: 06/03/2020 20:21   DG Shoulder Right  Result Date: 06/03/2020 CLINICAL DATA:  Pain status post fall EXAM: RIGHT SHOULDER - 2+ VIEW COMPARISON:  None. FINDINGS: There is no evidence of fracture or dislocation. There is no evidence of arthropathy or other focal bone abnormality. Soft tissues are unremarkable. IMPRESSION: Negative. Electronically Signed   By: Constance Holster M.D.   On: 06/03/2020 02:54   DG Elbow Complete Left  Result Date: 06/03/2020 CLINICAL DATA:  Pain EXAM: LEFT ELBOW - COMPLETE 3+ VIEW COMPARISON:  None. FINDINGS: There is soft tissue swelling about the elbow. Degenerative changes are noted. There is no joint effusion. IMPRESSION: Soft tissue swelling without evidence of an acute displaced fracture. Electronically Signed   By: Constance Holster M.D.   On: 06/03/2020 02:57   DG Elbow Complete Right  Result Date: 06/03/2020 CLINICAL DATA:  Pain EXAM: RIGHT ELBOW - COMPLETE 3+ VIEW COMPARISON:  None. FINDINGS: There is no evidence of fracture, dislocation, or joint effusion. There is no evidence of arthropathy or other focal bone abnormality. Soft tissues are unremarkable. IMPRESSION: Negative. Electronically Signed   By: Jamie Kato.D.  On: 06/03/2020 02:58   DG Abdomen 1 View  Result Date: 06/03/2020 CLINICAL DATA:  Foreign body evaluation for MRI. EXAM: ABDOMEN - 1 VIEW COMPARISON:  None. FINDINGS: No radiopaque foreign body is identified. Gas is present in nondilated loops of small and large bowel without evidence of obstruction. No acute osseous abnormality is seen. IMPRESSION: No radiopaque foreign body  identified. Electronically Signed   By: Logan Bores M.D.   On: 06/03/2020 04:33   CT HEAD WO CONTRAST  Result Date: 06/03/2020 CLINICAL DATA:  79 year old male with altered mental status. Status post fall down stairs with acute lower cervical spine fracture with dorsal hematoma in the spinal canal contributing to moderate spinal stenosis at C6-C7. Multilevel degenerative spinal stenosis. Postoperative day zero ACDF. EXAM: CT HEAD WITHOUT CONTRAST TECHNIQUE: Contiguous axial images were obtained from the base of the skull through the vertex without intravenous contrast. COMPARISON:  Head CT 0150 hours today. FINDINGS: Brain: No midline shift, mass effect, or evidence of intracranial mass lesion. No ventriculomegaly. No acute intracranial hemorrhage identified. Widely scattered and confluent bilateral cerebral white matter hypodensity appears stable from earlier today. No areas of definite cerebral edema. Vascular: Calcified atherosclerosis at the skull base. Intracranial vascular density appears to be stable from earlier today. No changes of acute cortically based infarct are identified. Skull: No skull fracture identified. Sinuses/Orbits: Stable left maxillary mucous retention cyst. Other visualized paranasal sinuses and mastoids are stable and well pneumatized. Other: Stable orbit and scalp soft tissues. IMPRESSION: Stable non contrast CT appearance of the brain from 0150 hours today. Advanced bilateral white matter disease with no acute intracranial abnormality identified. Electronically Signed   By: Genevie Ann M.D.   On: 06/03/2020 21:56   CT Head Wo Contrast  Result Date: 06/03/2020 CLINICAL DATA:  Fall downstairs, head injury, headache EXAM: CT HEAD WITHOUT CONTRAST CT CERVICAL SPINE WITHOUT CONTRAST TECHNIQUE: Multidetector CT imaging of the head and cervical spine was performed following the standard protocol without intravenous contrast. Multiplanar CT image reconstructions of the cervical spine were  also generated. COMPARISON:  None. FINDINGS: CT HEAD FINDINGS Brain: Normal anatomic configuration of the brain. Mild parenchymal volume loss is commensurate with the patient's age. There are moderate to severe periventricular and subcortical white matter changes present likely reflecting the sequela of small vessel ischemia. 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 No acute intracranial hemorrhage or infarct. No abnormal mass effect or midline shift. No abnormal intra or extra-axial mass lesion or fluid collection. Ventricular size is normal. Cerebellum is unremarkable. Vascular: No asymmetric hyperdense vasculature at the skull base. Skull: Intact Sinuses/Orbits: The paranasal sinuses are clear. The orbits are unremarkable. Other: Mastoid air cells and middle ear cavities are clear. CT CERVICAL SPINE FINDINGS Alignment: There is 2 mm anterolisthesis of C6 upon C7, likely posttraumatic in nature. Minimal anterolisthesis of C4 upon C5 is likely physiologic. Skull base and vertebrae: The craniocervical junction is unremarkable. Atlantal dental interval is normal. There are acute fractures involving the left lamina and spinous process of C6. There is a fracture involving the left pedicle of C6 as well as the the superior articular facet of C7 extending into the left C6-7 facet with mild joint space widening noted. Finally, a fracture extends through the pedicle of C6 on the right. Soft tissues and spinal canal: A small epidural hematoma measuring 3 mm x 10 mm is seen superficial to the C6 laminar fracture demonstrating minimal effacement of the canal a smaller epidural hematoma is seen  superiorly posterior to the thecal sac at C5-6. No paraspinal fluid collection is identified. There is subcutaneous infiltration within the posterior soft tissues superficial to the spinous processes likely representing edema. Disc levels: Review of the sagittal reformats demonstrates preservation of vertebral body  height. There is intervertebral disc space narrowing and endplate remodeling at K0-X3 in keeping with changes of moderate degenerative disc disease. 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 Review of the axial images confirms the above-mentioned fractures. There is multilevel uncovertebral and facet arthrosis resulting in multilevel mild neural foraminal narrowing. 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 Upper chest: Unremarkable Other: None significant IMPRESSION: No acute intracranial injury.  No calvarial fracture. Acute fracture of the cervical spine predominantly involving the posterior elements of C6 with fracture bilateral pedicles and minimal anterolisthesis of C6 upon C7. The fracture pattern is suggestive of a hyperextension injury. Fractures also are identified involving the spinous process of C6, the left lamina of C6, and the left C7 superior articular facet extending into the left C6-7 facet which demonstrates mild relative widening. 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 Superimposed small epidural hematoma at the level of the fracture and at C5-6 slightly superiorly. These results will be called to the ordering clinician or representative by the Radiologist Assistant, and communication documented in the PACS or [employer]. Electronically Signed   By: Fidela Salisbury MD   On: 06/03/2020 02:26   CT CERVICAL SPINE WO CONTRAST  Result Date: 06/03/2020 CLINICAL DATA:  79 year old male with altered mental status. Status post fall down stairs with acute lower cervical spine fracture with dorsal hematoma in the spinal canal contributing to moderate spinal stenosis at C6-C7. Multilevel degenerative spinal stenosis. Postoperative day zero ACDF. EXAM: CT CERVICAL SPINE WITHOUT CONTRAST TECHNIQUE: Multidetector CT imaging of the cervical spine was performed without intravenous contrast. Multiplanar CT image reconstructions were also generated. COMPARISON:   Cervical spine CT 0150 hours today. Preoperative cervical spine MRI 0503 hours today. FINDINGS: Alignment: Mildly improved cervical lordosis from earlier today. Subtle anterolisthesis of C4 on C5 is stable and appears to be degenerative in nature. Mild posttraumatic anterolisthesis of C6 on C7 appears stable. See additional details below. Skull base and vertebrae: Bilateral C6 pedicle fractures remain nondisplaced. There is less distraction of the C6 lamina fractures now. A nondisplaced fracture through the left C7 superior articulating facet and lamina is stable (sagittal image 41 now). Postoperative details are below. No new osseous abnormality identified. Stable subchondral cyst at the left odontoid. Visualized skull base is intact. No atlanto-occipital dissociation. Soft tissues and spinal canal: Ventral and posterior increased intraspinal density (sagittal image 30 with soft tissue windows) appear stable from the CT earlier this morning and more resembles ligamentous hypertrophy than intraspinal hemorrhage on the MRI. Small volume of prevertebral and small to moderate volume left lateral neck soft tissue gas is related to interval ACDF. No postoperative fluid collection is evident. Disc levels: Stable CT appearance of the level C2-C3 through C4-C5. Including moderate to severe multifactorial spinal stenosis at C3-C4 (series 3, image 38). C5-C6: Stable disc space loss, circumferential disc osteophyte complex. Mild spinal stenosis, severe left and moderate right C6 foraminal stenosis at this level appears stable from the preoperative MRI. C6-C7: Interval ACDF. Hardware appears intact with no adverse features. Small volume of posterior dural or epidural hemorrhage here appears not significantly changed (series 3, image 56), about 2 mm in thickness. No CT evidence of increased spinal stenosis. And C7 foraminal patency especially  on the left appears improved. C7-T1:  Stable, relatively negative. Upper chest: Visible  upper thoracic levels remain grossly intact. Mild apical lung scarring on the left is stable. IMPRESSION: 1. Interval ACDF with no adverse hardware features. Bilateral C6 posterior element and left C7 superior articulating facet fractures now are largely nondisplaced. A small volume of dorsal intraspinal hemorrhage eccentric to the left appears stable. No increased spinal stenosis here evident by CT, and C7 foraminal patency appears improved especially on the left. 2. Degenerative lumbar spinal stenosis elsewhere appears stable, and is moderate to severe at C3-C4 as previously noted. 3. No new osseous abnormality identified. Small volume postoperative gas in the neck. Electronically Signed   By: Genevie Ann M.D.   On: 06/03/2020 22:11   CT Cervical Spine Wo Contrast  Result Date: 06/03/2020 CLINICAL DATA:  Fall downstairs, head injury, headache EXAM: CT HEAD WITHOUT CONTRAST CT CERVICAL SPINE WITHOUT CONTRAST TECHNIQUE: Multidetector CT imaging of the head and cervical spine was performed following the standard protocol without intravenous contrast. Multiplanar CT image reconstructions of the cervical spine were also generated. COMPARISON:  None. FINDINGS: CT HEAD FINDINGS Brain: Normal anatomic configuration of the brain. Mild parenchymal volume loss is commensurate with the patient's age. There are moderate to severe periventricular and subcortical white matter changes present likely reflecting the sequela of small vessel ischemia. 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 No acute intracranial hemorrhage or infarct. No abnormal mass effect or midline shift. No abnormal intra or extra-axial mass lesion or fluid collection. Ventricular size is normal. Cerebellum is unremarkable. Vascular: No asymmetric hyperdense vasculature at the skull base. Skull: Intact Sinuses/Orbits: The paranasal sinuses are clear. The orbits are unremarkable. Other: Mastoid air cells and middle ear cavities are clear. CT  CERVICAL SPINE FINDINGS Alignment: There is 2 mm anterolisthesis of C6 upon C7, likely posttraumatic in nature. Minimal anterolisthesis of C4 upon C5 is likely physiologic. Skull base and vertebrae: The craniocervical junction is unremarkable. Atlantal dental interval is normal. There are acute fractures involving the left lamina and spinous process of C6. There is a fracture involving the left pedicle of C6 as well as the the superior articular facet of C7 extending into the left C6-7 facet with mild joint space widening noted. Finally, a fracture extends through the pedicle of C6 on the right. Soft tissues and spinal canal: A small epidural hematoma measuring 3 mm x 10 mm is seen superficial to the C6 laminar fracture demonstrating minimal effacement of the canal a smaller epidural hematoma is seen superiorly posterior to the thecal sac at C5-6. No paraspinal fluid collection is identified. There is subcutaneous infiltration within the posterior soft tissues superficial to the spinous processes likely representing edema. Disc levels: Review of the sagittal reformats demonstrates preservation of vertebral body height. There is intervertebral disc space narrowing and endplate remodeling at H4-L9 in keeping with changes of moderate degenerative disc disease. 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 Review of the axial images confirms the above-mentioned fractures. There is multilevel uncovertebral and facet arthrosis resulting in multilevel mild neural foraminal narrowing. 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 Upper chest: Unremarkable Other: None significant IMPRESSION: No acute intracranial injury.  No calvarial fracture. Acute fracture of the cervical spine predominantly involving the posterior elements of C6 with fracture bilateral pedicles and minimal anterolisthesis of C6 upon C7. The fracture pattern is suggestive of a hyperextension injury. Fractures also are identified  involving the spinous process of C6, the  left lamina of C6, and the left C7 superior articular facet extending into the left C6-7 facet which demonstrates mild relative widening. 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 Superimposed small epidural hematoma at the level of the fracture and at C5-6 slightly superiorly. These results will be called to the ordering clinician or representative by the Radiologist Assistant, and communication documented in the PACS or [employer]. Electronically Signed   By: Fidela Salisbury MD   On: 06/03/2020 02:26   MR Cervical Spine Wo Contrast  Result Date: 06/03/2020 CLINICAL DATA:  Fall with cervical spine fracture. EXAM: MRI CERVICAL SPINE WITHOUT CONTRAST TECHNIQUE: Multiplanar, multisequence MR imaging of the cervical spine was performed. No intravenous contrast was administered. COMPARISON:  Cervical spine CT 06/03/2020 FINDINGS: Image quality is degraded by motion artifact and increased image noise due to the presence of a cervical spine collar limiting coil positioning. Alignment: Unchanged trace anterolisthesis of C6 on C7. Vertebrae: Mild posterior element edema at C6-7 related to underlying fractures, better demonstrated on CT. Chronic degenerative endplate changes at F7-4 and C6-7. Cord: No convincing spinal cord signal abnormality is identified within study limitations. A small dorsal epidural hematoma at C6-7 measures up to 3 mm in thickness. Posterior Fossa, vertebral arteries, paraspinal tissues: Minimal prevertebral edema. Mild right and moderate left-sided posterior paraspinal soft tissue edema in the mid and lower cervical spine including in the inter spinous soft tissues at C5-6 and C6-7. Disc levels: The cervical spinal canal is diffusely narrow on a congenital basis. Detailed assessment of disc and facet degeneration is limited by motion, however there is severe multifactorial spinal stenosis at C3-4 greater than C4-5 with moderate cord  flattening. Multifactorial spinal stenosis at C5-6 is moderate, and there is also moderate spinal stenosis at C6-7 in part secondary to the epidural hematoma. IMPRESSION: 1. Motion degraded examination. 2. Known posterior element fractures at C6-7 with associated mild marrow edema and moderate paraspinal soft tissue/posterior ligamentous complex edema. 3. Small dorsal epidural hematoma at C6-7 contributing to moderate spinal stenosis. 4. No convincing spinal cord signal abnormality. 5. 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 Electronically Signed   By: Logan Bores M.D.   On: 06/03/2020 06:12   DG Chest Portable 1 View  Result Date: 06/03/2020 CLINICAL DATA:  Foreign body evaluation for MRI. EXAM: PORTABLE CHEST 1 VIEW COMPARISON:  None. FINDINGS: The cardiomediastinal silhouette is within normal limits. The patient's chin projects over the right lung apex. There is slight prominence of the interstitial markings without overt pulmonary edema. No confluent airspace opacity, sizeable pleural effusion, or pneumothorax is identified. No acute osseous abnormality is seen. IMPRESSION: No radiopaque foreign body identified. Electronically Signed   By: Logan Bores M.D.   On: 06/03/2020 04:35   DG Shoulder Left  Result Date: 06/03/2020 CLINICAL DATA:  Pain EXAM: LEFT SHOULDER - 2+ VIEW COMPARISON:  None. FINDINGS: There is no evidence of fracture or dislocation. There is no evidence of arthropathy or other focal bone abnormality. Soft tissues are unremarkable. IMPRESSION: Negative. Electronically Signed   By: Constance Holster M.D.   On: 06/03/2020 02:55   DG C-Arm 1-60 Min  Result Date: 06/03/2020 CLINICAL DATA:  Cervical fusion EXAM: CERVICAL SPINE - 2-3 VIEW; DG C-ARM 1-60 MIN COMPARISON:  MRI from earlier in the same day. FLUOROSCOPY TIME:  Radiation Exposure Index (as provided by the fluoroscopic device): Not available If the device does not provide the exposure index: Fluoroscopy  Time: 6 seconds Number of Acquired Images:  2 FINDINGS: Initial image demonstrates a surgical instrument anterior to the cervical spine at the C6-7 level. Mild anterolisthesis of C6 on C7 is seen. Subsequent interbody fusion and anterior fixation is noted at C6-7. IMPRESSION: Interbody fusion at C6-7 with anterior fixation Electronically Signed   By: Inez Catalina M.D.   On: 06/03/2020 20:21   ASSESSMENT AND PLAN:   Mitchell Stewart  is a 79 y.o. African-American male with a known history of type 2 diabetes mellitus, dyslipidemia, dementia and depression as well as prostate cancer, presented to the emergency room with acute onset of accidental mechanical fall after getting up to go to the bathroom and falling down a flight of 15 stairs landing on his back on concrete with subsequent neck pain as well as bilateral elbow pain. Patient has significant dementia and confuse unable to give any history review of systems  1.  C6-C7 traumatic spine fracture secondary to mechanical fall. - continue immobilizing his C-spine with neck collar -C-spine MRI -- unstable C67 fracture -Pain management with PO and IV pain meds -Neurosurgery consultation with Dr. Deetta Perla  -POD #1  fixation of C6/7 fracture  -PT OT started -- DC IV fluids -- TOC for discharge planning  2.  Type II diabetes mellitus without complications on long-term insulin therapy. - on supplement coverage with NovoLog. -- will continue his basal coverage.  3.  Dyslipidemia. -continue statin therapy.  4.  Dementia with depression. Behavioral issues/agitation Per some -continue Seroquel. -- PRN Ativan/Haldol  5.  BPH. - continue Flomax.  6.  DVT prophylaxis. -Lovenox  CODE STATUS: Full code  Status is: Inpatient  Dispo: The patient is from: Home  Anticipated d/c is to:TBD  Anticipated d/c date is: 3 days  Patient currently is not medically stable to d/c. Spoke with patient's son Mitchell Stewart patient is status post cervical C67 fracture repair. Physical therapy to see patient. TOC for discharge planning.    TOTAL TIME TAKING CARE OF THIS PATIENT: *25* minutes.  >50% time spent on counselling and coordination of care  Note: This dictation was prepared with Dragon dictation along with smaller phrase technology. Any transcriptional errors that result from this process are unintentional.  Fritzi Mandes M.D    [facility]   CC: Primary care physician; Lesia Hausen, PAPatient ID: Mitchell Stewart, male   DOB: Aug 17, 1941, 79 y.o.   MRN: [id number]

## 2020-06-04 NOTE — Progress Notes (Signed)
Ch visited with Pt as per request from RN for prayer. Pt sitting in bed, therapist getting Pt ready for physical therapy. Pt started saying that he knows Elige Ko is [place], and other things like recollecting stay with a person before coming to the hospital. Pt did not sound very coherent. Pt said that Jesus is who he fully believes in. Ch said that she will pray for him, and attempt visit after PT.

## 2020-06-05 DIAGNOSIS — S129XXA Fracture of neck, unspecified, initial encounter: Secondary | ICD-10-CM | POA: Diagnosis not present

## 2020-06-05 LAB — CBC WITH DIFFERENTIAL/PLATELET
Abs Immature Granulocytes: 0.12 10*3/uL — ABNORMAL HIGH (ref 0.00–0.07)
Basophils Absolute: 0 10*3/uL (ref 0.0–0.1)
Basophils Relative: 0 %
Eosinophils Absolute: 0 10*3/uL (ref 0.0–0.5)
Eosinophils Relative: 0 %
HCT: 42.1 % (ref 39.0–52.0)
Hemoglobin: 14.5 g/dL (ref 13.0–17.0)
Immature Granulocytes: 1 %
Lymphocytes Relative: 3 %
Lymphs Abs: 0.5 10*3/uL — ABNORMAL LOW (ref 0.7–4.0)
MCH: 29.3 pg (ref 26.0–34.0)
MCHC: 34.4 g/dL (ref 30.0–36.0)
MCV: 85.1 fL (ref 80.0–100.0)
Monocytes Absolute: 0.7 10*3/uL (ref 0.1–1.0)
Monocytes Relative: 4 %
Neutro Abs: 15.3 10*3/uL — ABNORMAL HIGH (ref 1.7–7.7)
Neutrophils Relative %: 92 %
Platelets: 166 10*3/uL (ref 150–400)
RBC: 4.95 MIL/uL (ref 4.22–5.81)
RDW: 12.8 % (ref 11.5–15.5)
WBC: 16.6 10*3/uL — ABNORMAL HIGH (ref 4.0–10.5)
nRBC: 0 % (ref 0.0–0.2)

## 2020-06-05 LAB — COMPREHENSIVE METABOLIC PANEL
ALT: 18 U/L (ref 0–44)
AST: 26 U/L (ref 15–41)
Albumin: 3.6 g/dL (ref 3.5–5.0)
Alkaline Phosphatase: 62 U/L (ref 38–126)
Anion gap: 12 (ref 5–15)
BUN: 23 mg/dL (ref 8–23)
CO2: 23 mmol/L (ref 22–32)
Calcium: 8.7 mg/dL — ABNORMAL LOW (ref 8.9–10.3)
Chloride: 104 mmol/L (ref 98–111)
Creatinine, Ser: 0.92 mg/dL (ref 0.61–1.24)
GFR, Estimated: >60 mL/min (ref >60)
Glucose, Bld: 219 mg/dL — ABNORMAL HIGH (ref 70–99)
Potassium: 3.8 mmol/L (ref 3.5–5.1)
Sodium: 139 mmol/L (ref 135–145)
Total Bilirubin: 1.3 mg/dL — ABNORMAL HIGH (ref 0.3–1.2)
Total Protein: 6.7 g/dL (ref 6.5–8.1)

## 2020-06-05 LAB — GLUCOSE, CAPILLARY
Glucose-Capillary: 274 mg/dL — ABNORMAL HIGH (ref 70–99)
Glucose-Capillary: 113 mg/dL — ABNORMAL HIGH (ref 70–99)
Glucose-Capillary: 234 mg/dL — ABNORMAL HIGH (ref 70–99)
Glucose-Capillary: 230 mg/dL — ABNORMAL HIGH (ref 70–99)
Glucose-Capillary: 194 mg/dL — ABNORMAL HIGH (ref 70–99)

## 2020-06-05 NOTE — Progress Notes (Addendum)
PROGRESS NOTE  PLEAS CARNEAL QMV:[id number] DOB: 04/24/1941 DOA: 06/03/2020 PCP: Lesia Hausen, PA  HPI/Recap of past 24 hours: DavidDunlapis a79 y.o.African-American malewith a known history of type 2 diabetes mellitus, dyslipidemia, dementia and depression as well as prostate cancer, presented to the emergency room with acute onset of accidental mechanical fall after getting up to go to the bathroom and falling down a flight of 15 stairs landing on his back on concrete with subsequent neck pain as well as bilateral elbow pain. Patient has significant dementia and confuse unable to give any history review of systems.  06/05/20: Seen and examined.  Alert and confused.  Discomfort with palpation of his right upper quadrant abdominen  Assessment/Plan: Active Problems:   Traumatic fracture of cervical spine (HCC)  1. C6-C7 traumatic spine fracture secondary to mechanical fall. - continueimmobilizing his C-spine with neck collar -C-spine MRI -- unstable C67 fracture -Pain management with PO and IV pain meds -Neurosurgery consultation with Dr. Deetta Perla  -POD #2  fixation of C6/7 fracture  -PT OT started -- DC IV fluids -- TOC for discharge planning  2. Type II diabetesmellitus without complications on long-term insulin therapy. --A1C 7.6 on 06/03/20 - on supplement coverage with NovoLog. -- will continue his basal coverage.  3. Dyslipidemia. -continue statin therapy.  4. Dementia with depression. Behavioral issues/agitation Per some -continue Seroquel. -- PRN Ativan/Haldol  5. BPH. - continue Flomax. Monitor urine output  6. DVT prophylaxis. -Lovenox  7. RUQ pain unclear etiology Obtain CBC w diff and CMP No UA done on admission   CODE STATUS: Full code  Status is: Inpatient  Dispo: The patient is from:Home Anticipated d/c is to:TBD Anticipated d/c date is: 3 days Patient currently is not medically  stable to d/c. Spoke with patient's son [name] patient is status post cervical C67 fracture repair. Physical therapy to see patient. TOC for discharge planning.       Status is: Inpatient    Dispo: The patient is from:Home               Anticipated d/c is to: SNF              Anticipated d/c date is:06/06/20               Patient currently not stable post NSG procedure.         Objective: Vitals:   06/04/20 2217 06/05/20 0017 06/05/20 0554 06/05/20 0739  BP: 137/85  (!) 148/91 (!) 147/87  Pulse: 99  99 100  Resp: 16  16 17   Temp: 99.7 F (37.6 C) 98 F (36.7 C) 98 F (36.7 C) 98.6 F (37 C)  TempSrc: Oral Oral Oral Oral  SpO2: 99%  98% 98%  Weight:      Height:        Intake/Output Summary (Last 24 hours) at 06/05/2020 1453 Last data filed at 06/05/2020 0300 Gross per 24 hour  Intake 100.5 ml  Output 700 ml  Net -599.5 ml   Filed Weights   06/03/20 0121  Weight: 56.7 kg    Exam:  . General: 79 y.o. year-old male well developed well nourished in no acute distress.  Alert and confused. . Cardiovascular: Regular rate and rhythm with no rubs or gallops.  No thyromegaly or JVD noted.   Marland Kitchen Respiratory: Clear to auscultation with no wheezes or rales. Good inspiratory effort. . Abdomen: Soft right upper quadrant tenderness on palpation.  Bowel sounds present.   . Musculoskeletal:  No lower extremity edema bilaterally. Marland Kitchen Psychiatry: Mood is appropriate for condition and setting   Data Reviewed: CBC: Recent Labs  Lab 06/03/20 0320 06/03/20 2227  WBC 12.1* 11.1*  NEUTROABS 10.4*  --   HGB 14.7 14.1  HCT 43.9 42.2  MCV 87.8 88.1  PLT 138* 607   Basic Metabolic Panel: Recent Labs  Lab 06/03/20 0320 06/03/20 2227  NA 139 138  K 3.4* 3.8  CL 105 105  CO2 20* 19*  GLUCOSE 148* 151*  BUN 27* 15  CREATININE 1.17 0.85  CALCIUM 9.0 8.2*   GFR: Estimated Creatinine Clearance: 57.4 mL/min (by C-G formula based on SCr of 0.85 mg/dL). Liver  Function Tests: No results for input(s): AST, ALT, ALKPHOS, BILITOT, PROT, ALBUMIN in the last 168 hours. No results for input(s): LIPASE, AMYLASE in the last 168 hours. No results for input(s): AMMONIA in the last 168 hours. Coagulation Profile: Recent Labs  Lab 06/03/20 0320  INR 1.0   Cardiac Enzymes: No results for input(s): CKTOTAL, CKMB, CKMBINDEX, TROPONINI in the last 168 hours. BNP (last 3 results) No results for input(s): PROBNP in the last 8760 hours. HbA1C: Recent Labs    06/03/20 2227  HGBA1C 7.6*   CBG: Recent Labs  Lab 06/04/20 1611 06/04/20 2145 06/05/20 0305 06/05/20 0741 06/05/20 1152  GLUCAP 223* 190* 274* 113* 234*   Lipid Profile: No results for input(s): CHOL, HDL, LDLCALC, TRIG, CHOLHDL, LDLDIRECT in the last 72 hours. Thyroid Function Tests: No results for input(s): TSH, T4TOTAL, FREET4, T3FREE, THYROIDAB in the last 72 hours. Anemia Panel: No results for input(s): VITAMINB12, FOLATE, FERRITIN, TIBC, IRON, RETICCTPCT in the last 72 hours. Urine analysis: No results found for: COLORURINE, APPEARANCEUR, LABSPEC, PHURINE, GLUCOSEU, HGBUR, BILIRUBINUR, KETONESUR, PROTEINUR, UROBILINOGEN, NITRITE, LEUKOCYTESUR Sepsis Labs: @LABRCNTIP (procalcitonin:4,lacticidven:4)  ) Recent Results (from the past 240 hour(s))  Respiratory Panel by RT PCR (Flu A&B, Covid) - Nasopharyngeal Swab     Status: None   Collection Time: 06/03/20  3:20 AM   Specimen: Nasopharyngeal Swab  Result Value Ref Range Status   SARS Coronavirus 2 by RT PCR NEGATIVE NEGATIVE Final    Comment: (NOTE) SARS-CoV-2 target nucleic acids are NOT DETECTED.  The SARS-CoV-2 RNA is generally detectable in upper respiratoy specimens during the acute phase of infection. The lowest concentration of SARS-CoV-2 viral copies this assay can detect is 131 copies/mL. A negative result does not preclude SARS-Cov-2 infection and should not be used as the sole basis for treatment or other patient  management decisions. A negative result may occur with  improper specimen collection/handling, submission of specimen other than nasopharyngeal swab, presence of viral mutation(s) within the areas targeted by this assay, and inadequate number of viral copies (<131 copies/mL). A negative result must be combined with clinical observations, patient history, and epidemiological information. The expected result is Negative.  Fact Sheet for Patients:  PinkCheek.be  Fact Sheet for Healthcare Providers:  GravelBags.it  This test is no t yet approved or cleared by the [place] FDA and  has been authorized for detection and/or diagnosis of SARS-CoV-2 by FDA under an Emergency Use Authorization (EUA). This EUA will remain  in effect (meaning this test can be used) for the duration of the COVID-19 declaration under Section 564(b)(1) of the Act, 21 U.S.C. section 360bbb-3(b)(1), unless the authorization is terminated or revoked sooner.     Influenza A by PCR NEGATIVE NEGATIVE Final   Influenza B by PCR NEGATIVE NEGATIVE Final    Comment: (NOTE) The Xpert  Xpress SARS-CoV-2/FLU/RSV assay is intended as an aid in  the diagnosis of influenza from Nasopharyngeal swab specimens and  should not be used as a sole basis for treatment. Nasal washings and  aspirates are unacceptable for Xpert Xpress SARS-CoV-2/FLU/RSV  testing.  Fact Sheet for Patients: PinkCheek.be  Fact Sheet for Healthcare Providers: GravelBags.it  This test is not yet approved or cleared by the [place] FDA and  has been authorized for detection and/or diagnosis of SARS-CoV-2 by  FDA under an Emergency Use Authorization (EUA). This EUA will remain  in effect (meaning this test can be used) for the duration of the  Covid-19 declaration under Section 564(b)(1) of the Act, 21  U.S.C. section 360bbb-3(b)(1),  unless the authorization is  terminated or revoked. Performed at [facility], [address], [place], [place] [address]       Studies: No results found.  Scheduled Meds: . Chlorhexidine Gluconate Cloth  6 each Topical Daily  . enoxaparin (LOVENOX) injection  40 mg Subcutaneous Q24H  . insulin aspart  0-9 Units Subcutaneous Q4H  . insulin glargine  7 Units Subcutaneous Daily  . pravastatin  20 mg Oral q1800  . QUEtiapine  25 mg Oral QHS  . sertraline  150 mg Oral Daily  . tamsulosin  0.4 mg Oral Daily    Continuous Infusions: . sodium chloride 1,000 mL (06/04/20 1502)  . methocarbamol (ROBAXIN) IV Stopped (06/04/20 0700)     LOS: 2 days     Kayleen Memos, MD [facility] Pager [phone]  If 7PM-7AM, please contact night-coverage [email or web address] Password [email or web address] 06/05/2020, 2:53 PM

## 2020-06-05 NOTE — Progress Notes (Signed)
   Progress Note   Date: 06/05/2020  Mr Buscemi is POD#2 from C6/7 ACDF  Overnight Events: 10/13: None  10/12 [id number] was uncomplicated. Patient slow to wake up from anesthesia. CT of head and cervical spine unremarkable.  This morning, he appears to be at baseline with confusion. He is not endorsing any severe neck pain but does have continued shoulder pain he presented with.   Vital Signs: Temp:  [98 F (36.7 C)-99.7 F (37.6 C)] 98 F (36.7 C) (10/13 0554) Pulse Rate:  [99-107] 99 (10/13 0554) Resp:  [16-18] 16 (10/13 0554) BP: (137-148)/(81-91) 148/91 (10/13 0554) SpO2:  [94 %-100 %] 98 % (10/13 0554) Temp (24hrs), Avg:98.5 F (36.9 C), Min:98 F (36.7 C), Max:99.7 F (37.6 C)  Weight: 56.7 kg  Problem List Patient Active Problem List   Diagnosis Date Noted  . Traumatic fracture of cervical spine ([place]) 06/03/2020  . Dementia without behavioral disturbance ([place])   . Forehead contusion     Medications: Scheduled Meds: . Chlorhexidine Gluconate Cloth  6 each Topical Daily  . enoxaparin (LOVENOX) injection  40 mg Subcutaneous Q24H  . insulin aspart  0-9 Units Subcutaneous Q4H  . insulin glargine  7 Units Subcutaneous Daily  . pravastatin  20 mg Oral q1800  . QUEtiapine  25 mg Oral QHS  . sertraline  150 mg Oral Daily  . tamsulosin  0.4 mg Oral Daily   Continuous Infusions: . sodium chloride 1,000 mL (06/04/20 1502)  . methocarbamol (ROBAXIN) IV Stopped (06/04/20 0700)   PRN Meds:.sodium chloride, acetaminophen, magnesium hydroxide, methocarbamol (ROBAXIN) IV, ondansetron **OR** ondansetron (ZOFRAN) IV, oxyCODONE, traMADol  Labs:  Lab Results  Component Value Date   WBC 11.1 (H) 06/03/2020   WBC 12.1 (H) 06/03/2020   HCT 42.2 06/03/2020   HCT 43.9 06/03/2020   PLT 158 06/03/2020   PLT 138 (L) 06/03/2020    Lab Results  Component Value Date   INR 1.0 06/03/2020    Lab Results  Component Value Date   NA 138 06/03/2020   NA 139 06/03/2020   K  3.8 06/03/2020   K 3.4 (L) 06/03/2020   BUN 15 06/03/2020   BUN 27 (H) 06/03/2020   No results found for: MG  Exam: Neurologic exam:  Mental status: alertness: alert, disoriented affect: normal Speech: fluent and clear Neck: Incision flat, Collar on Motor:strength symmetric 4/5 in grip and 5/5 in biceps and triceps at full effort, shoulder pain but is antigravity. He has 5/5 strength in lower extremity hip flexion, dorsiflexion, plantar flexion Sensory: intact to light touch in all extremities Gait: not tested  A/P: Mr Hinch is POD2  from C6/7 ACDF for cervical spine fracture  #s/p ACDF - Continue [place] J collar at all time - OK to work with physical therapy - Cleared for DVT prophylaxis - Will plan f/u in clinic in 3 weeks - Patient will need cervical spine xrays before discharge (Ordered)  Patient will need long term placement given injury and dementia preventing discharge home.     Lonell Face, NP Neurosurgery

## 2020-06-05 NOTE — NC FL2 (Signed)
[place] LEVEL OF CARE SCREENING TOOL     IDENTIFICATION  Patient Name: Mitchell Stewart Birthdate: [birth year] Sex: male Admission Date (Current Location): 06/03/2020  [place] and [place] Number:  Engineering geologist and Address:  [facility], [address], [place], [place] [address]      Provider Number: [id number]  Attending Physician Name and Address:  Kayleen Memos, DO  Relative Name and Phone Number:  Estuardo Frisbee [phone]    Current Level of Care: Hospital Recommended Level of Care: [place] Prior Approval Number:    Date Approved/Denied:   PASRR Number:    Discharge Plan: SNF    Current Diagnoses: Patient Active Problem List   Diagnosis Date Noted  . Traumatic fracture of cervical spine ([place]) 06/03/2020  . Dementia without behavioral disturbance ([place])   . Forehead contusion     Orientation RESPIRATION BLADDER Height & Weight     Self  Normal External catheter Weight: 56.7 kg Height:  5\' 6"  (167.6 cm)  BEHAVIORAL SYMPTOMS/MOOD NEUROLOGICAL BOWEL NUTRITION STATUS      Continent Diet (Soft Diet)  AMBULATORY STATUS COMMUNICATION OF NEEDS Skin   Extensive Assist Verbally Surgical wounds                       Personal Care Assistance Level of Assistance  Bathing, Feeding, Dressing Bathing Assistance: Maximum assistance Feeding assistance: Limited assistance Dressing Assistance: Maximum assistance     Functional Limitations Info  Sight, Hearing, Speech Sight Info: Adequate Hearing Info: Adequate Speech Info: Adequate    SPECIAL CARE FACTORS FREQUENCY  PT (By licensed PT), OT (By licensed OT)                    Contractures Contractures Info: Not present    Additional Factors Info  Code Status, Allergies Code Status Info: Full Allergies Info: No known allergies           Current Medications (06/05/2020):  This is the current hospital active medication list Current  Facility-Administered Medications  Medication Dose Route Frequency Provider Last Rate Last Admin  . 0.9 %  sodium chloride infusion   Intravenous PRN Fritzi Mandes, MD 10 mL/hr at 06/04/20 1502 1,000 mL at 06/04/20 1502  . acetaminophen (TYLENOL) tablet 650 mg  650 mg Oral Q6H PRN Fritzi Mandes, MD   650 mg at 06/04/20 2226  . Chlorhexidine Gluconate Cloth 2 % PADS 6 each  6 each Topical Daily Fritzi Mandes, MD      . enoxaparin (LOVENOX) injection 40 mg  40 mg Subcutaneous Q24H Zdeb, Christine, NP   40 mg at 06/04/20 2227  . insulin aspart (novoLOG) injection 0-9 Units  0-9 Units Subcutaneous Q4H Mansy, Arvella Merles, MD   5 Units at 06/05/20 0339  . insulin glargine (LANTUS) injection 7 Units  7 Units Subcutaneous Daily Mansy, Jan A, MD   7 Units at 06/04/20 1130  . magnesium hydroxide (MILK OF MAGNESIA) suspension 30 mL  30 mL Oral Daily PRN Mansy, Jan A, MD      . methocarbamol (ROBAXIN) 500 mg in dextrose 5 % 50 mL IVPB  500 mg Intravenous Q8H PRN Zdeb, Christine, NP   Stopping Infusion hung by another clincian at 06/04/20 0700  . ondansetron (ZOFRAN) tablet 4 mg  4 mg Oral Q6H PRN Mansy, Jan A, MD       Or  . ondansetron [facility]) injection 4 mg  4 mg Intravenous  Q6H PRN Mansy, Arvella Merles, MD   4 mg at 06/03/20 1812  . oxyCODONE (Oxy IR/ROXICODONE) immediate release tablet 5 mg  5 mg Oral Q6H PRN Fritzi Mandes, MD   5 mg at 06/04/20 1839  . pravastatin (PRAVACHOL) tablet 20 mg  20 mg Oral q1800 Mansy, Jan A, MD   20 mg at 06/04/20 1720  . QUEtiapine (SEROQUEL) tablet 25 mg  25 mg Oral QHS Mansy, Jan A, MD   25 mg at 06/04/20 2226  . sertraline (ZOLOFT) tablet 150 mg  150 mg Oral Daily Fritzi Mandes, MD   150 mg at 06/04/20 1352  . tamsulosin (FLOMAX) capsule 0.4 mg  0.4 mg Oral Daily Fritzi Mandes, MD      . traMADol Veatrice Bourbon) tablet 50 mg  50 mg Oral Q6H PRN Fritzi Mandes, MD         Discharge Medications: Please see discharge summary for a list of discharge medications.  Relevant Imaging Results:  Relevant Lab  Results:   Additional Information SS# [id number]  Shelbie Ammons, RN

## 2020-06-05 NOTE — Progress Notes (Signed)
Physical Therapy Treatment Patient Details Name: Mitchell Stewart MRN: [id number] DOB: Sep 22, 1940 Today's Date: 06/05/2020    History of Present Illness Pt is a 79 y/o M with PMH: dementia, DM, HOH, HLD and prostate cancer who presented to [facility] ED s/p fall down flight of stairs. Pt found to have cervical spine fx and is now s/p C6-7 ACDF (06/03/2020).    PT Comments    Upon entering room, Pt reported that he needed to have a BM wanted to use the bathroom. Pt required total A to come up to sitting and did not respond to single step commands or verbal and tactile cueing. Upon sitting up, pt's cervical collar was noted to be very loose with pt in mild right cervical lateral flexion. Pt's cervical collar was tightened,nursing notified. Pt displayed poor balance at EOB and required Min A to remain sitting up. Pt came back to supine with Max A. The brace was again readjusted for improved fit. Pt performed rolling to be placed on the bedpan. Pt will benefit from PT services in a SNF setting upon discharge to safely address deficits listed in patient problem list for decreased caregiver assistance and eventual return to PLOF.   Follow Up Recommendations  SNF     Equipment Recommendations  Rolling walker with 5" wheels    Recommendations for Other Services       Precautions / Restrictions Precautions Precautions: Fall;Cervical Precaution Comments: Brace on at all times Required Braces or Orthoses: Cervical Brace Cervical Brace: Hard collar Restrictions Other Position/Activity Restrictions: [place] J collar, HOB 30 degrees (Pt's brace was found loose and pt's head was in R lateral flexion upon arrival)    Mobility  Bed Mobility Overal bed mobility: Needs Assistance Bed Mobility: Rolling Rolling: Max assist   Supine to sit: Total assist Sit to supine: Max assist   General bed mobility comments: pt does not repond to single step commands or verbal cues. Pt does not participate in bed  mobility  Transfers Overall transfer level: Needs assistance               General transfer comment: not performed due to safety  Ambulation/Gait             General Gait Details: not performed due to safety   Stairs             Wheelchair Mobility    Modified Rankin (Stroke Patients Only)       Balance Overall balance assessment: Needs assistance Sitting-balance support: Feet unsupported Sitting balance-Leahy Scale: Poor Sitting balance - Comments: pt unable to maintina balance without Min therapist support       Standing balance comment: not assessed due to safety                            Cognition Arousal/Alertness: Lethargic Behavior During Therapy: WFL for tasks assessed/performed Overall Cognitive Status: History of cognitive impairments - at baseline                                 General Comments: pt oriented to self only. pt does not respond to single step commands      Exercises      General Comments General comments (skin integrity, edema, etc.): Anterior displacement of the R clivical and or/ palpable mass present.      Pertinent Vitals/Pain Faces Pain Scale: Hurts little more  Pain Location: Stomach pain Pain Descriptors / Indicators: Grimacing;Moaning Pain Intervention(s): Limited activity within patient's tolerance;Monitored during session;Repositioned    Home Living                      Prior Function            PT Goals (current goals can now be found in the care plan section) Acute Rehab PT Goals PT Goal Formulation: Patient unable to participate in goal setting Time For Goal Achievement: 06/17/20 Potential to Achieve Goals: Fair Progress towards PT goals: Progressing toward goals    Frequency    BID      PT Plan Current plan remains appropriate    Co-evaluation              AM-PAC PT "6 Clicks" Mobility   Outcome Measure  Help needed turning from your back to  your side while in a flat bed without using bedrails?: Total Help needed moving from lying on your back to sitting on the side of a flat bed without using bedrails?: Total Help needed moving to and from a bed to a chair (including a wheelchair)?: A Little Help needed standing up from a chair using your arms (e.g., wheelchair or bedside chair)?: A Lot Help needed to walk in hospital room?: A Lot Help needed climbing 3-5 steps with a railing? : A Lot 6 Click Score: 11    End of Session Equipment Utilized During Treatment: Gait belt;Oxygen Activity Tolerance: Patient limited by lethargy Patient left: with bed alarm set;in bed;with call bell/phone within reach;with SCD's reapplied Nurse Communication: Mobility status;Other (comment) (nurse notified about pt needing a BM and posistion of cervial brace) PT Visit Diagnosis: Muscle weakness (generalized) (M62.81);Unsteadiness on feet (R26.81);History of falling (Z91.81)     Time: 7858-8502 PT Time Calculation (min) (ACUTE ONLY): 24 min  Charges:                        Hervey Ard, SPT 06/05/20. 4:42 PM

## 2020-06-05 NOTE — Progress Notes (Signed)
Physical Therapy Treatment Patient Details Name: Mitchell Stewart MRN: [id number] DOB: 11/08/1940 Today's Date: 06/05/2020    History of Present Illness Pt is a 79 y/o M with PMH: dementia, DM, HOH, HLD and prostate cancer who presented to [facility] ED s/p fall down flight of stairs. Pt found to have cervical spine fx and is now s/p C6-7 ACDF (06/03/2020).    PT Comments    Pt was oriented to self and very lethargic upon arrival. Pt required frequent stimulation to remain awake. Pt reported R sided stomach pain that appeared to worse than what was reported during yesterday's session MD was notified.  Pt was able to perform minimal bed level therex with max tactile cues from therapist. Pt, however, could not stay awake and alert long enough to fully participate. Pt was assisted with reposistioning for comfort. Pt will benefit from PT services in a SNF setting upon discharge to safely address deficits listed in patient problem list for decreased caregiver assistance and eventual return to PLOF.   Follow Up Recommendations  SNF     Equipment Recommendations  Rolling walker with 5" wheels    Recommendations for Other Services       Precautions / Restrictions Precautions Precautions: Fall;Cervical Precaution Comments: Brace on at all times Required Braces or Orthoses: Cervical Brace Cervical Brace: Hard collar Restrictions Other Position/Activity Restrictions: [place] J collar, HOB 30 degrees    Mobility  Bed Mobility               General bed mobility comments: pt performed bed level therex only and required max tactile cues to complete  Transfers                    Ambulation/Gait                 Stairs             Wheelchair Mobility    Modified Rankin (Stroke Patients Only)       Balance                                            Cognition Arousal/Alertness: Lethargic Behavior During Therapy: WFL for tasks  assessed/performed Overall Cognitive Status: History of cognitive impairments - at baseline                                 General Comments: pt oriented to self only. pt does not respond to single step commands      Exercises Total Joint Exercises Quad Sets: AAROM;Both;5 reps Hip ABduction/ADduction: AROM;Both;5 reps Other Exercises Other Exercises: pt performs small scoots in bed for reposistioing    General Comments        Pertinent Vitals/Pain Faces Pain Scale: Hurts even more Pain Location: Stomach pain Pain Descriptors / Indicators: Grimacing;Sore;Moaning Pain Intervention(s): Limited activity within patient's tolerance;Monitored during session;Repositioned    Home Living                      Prior Function            PT Goals (current goals can now be found in the care plan section) Acute Rehab PT Goals PT Goal Formulation: Patient unable to participate in goal setting Time For Goal Achievement: 06/17/20 Potential to Achieve Goals: Fair Progress  towards PT goals: Progressing toward goals    Frequency    BID      PT Plan Current plan remains appropriate    Co-evaluation              AM-PAC PT "6 Clicks" Mobility   Outcome Measure  Help needed turning from your back to your side while in a flat bed without using bedrails?: Total Help needed moving from lying on your back to sitting on the side of a flat bed without using bedrails?: Total Help needed moving to and from a bed to a chair (including a wheelchair)?: A Little Help needed standing up from a chair using your arms (e.g., wheelchair or bedside chair)?: A Lot Help needed to walk in hospital room?: A Lot Help needed climbing 3-5 steps with a railing? : A Lot 6 Click Score: 11    End of Session Equipment Utilized During Treatment: Gait belt;Oxygen Activity Tolerance: Patient limited by lethargy Patient left: with bed alarm set;in bed;with call bell/phone within  reach;with SCD's reapplied Nurse Communication: Mobility status;Other (comment) (MD notififed about stomach pain duration and changes in intensity) PT Visit Diagnosis: Muscle weakness (generalized) (M62.81);Unsteadiness on feet (R26.81);History of falling (Z91.81)     Time: 6803-2122 PT Time Calculation (min) (ACUTE ONLY): 11 min  Charges:                        Hervey Ard, SPT 06/05/20. 2:51 PM

## 2020-06-05 NOTE — TOC Initial Note (Signed)
Transition of Care [facility]) - Initial/Assessment Note    Patient Details  Name: Mitchell Stewart MRN: [id number] Date of Birth: [birth year]  Transition of Care [facility]) CM/SW Contact:    Shelbie Ammons, RN Phone Number: 06/05/2020, 9:51 AM  Clinical Narrative:   RNCM called patient's son for assessment. Patient admitted to hospital after fall at home down flight of steps resulting in cervical fracture. Son, Deionte reports that he is in agreement with therapy dept that patient will need to go into short term rehab before being able to return home. He verbalizes that patient has never had to have rehab before. Son is in agreement to bed search of all local facilities as well as in [facility] as this is where he sisters live.  RNCM completed PASSR including faxing additional info, completed FL2 and started bed search.             Expected Discharge Plan: Skilled Nursing Facility Barriers to Discharge: No Barriers Identified   Patient Goals and CMS Choice     Choice offered to / list presented to : [facility] POA / Guardian  Expected Discharge Plan and Services Expected Discharge Plan: [place] Acute Care Choice: [place] Living arrangements for the past 2 months: Single Family Home                                      Prior Living Arrangements/Services Living arrangements for the past 2 months: Single Family Home Lives with:: Adult Children Patient language and need for interpreter reviewed:: Yes Do you feel safe going back to the place where you live?: Yes      Need for Family Participation in Patient Care: Yes (Comment) Care giver support system in place?: Yes (comment)   Criminal Activity/Legal Involvement Pertinent to Current Situation/Hospitalization: No - Comment as needed  Activities of Daily Living   ADL Screening (condition at time of admission) Patient's cognitive ability adequate to safely complete daily activities?: No Is the  patient deaf or have difficulty hearing?: Yes Does the patient have difficulty seeing, even when wearing glasses/contacts?: No Does the patient have difficulty concentrating, remembering, or making decisions?: Yes Patient able to express need for assistance with ADLs?: No Does the patient have difficulty dressing or bathing?: Yes Independently performs ADLs?: No Communication: Needs assistance Is this a change from baseline?: Pre-admission baseline Dressing (OT): Needs assistance Is this a change from baseline?: Change from baseline, expected to last >3 days Grooming: Needs assistance Is this a change from baseline?: Change from baseline, expected to last >3 days Feeding: Needs assistance Is this a change from baseline?: Change from baseline, expected to last >3 days Bathing: Needs assistance Is this a change from baseline?: Change from baseline, expected to last >3 days Toileting: Needs assistance Is this a change from baseline?: Change from baseline, expected to last >3days In/Out Bed: Needs assistance Is this a change from baseline?: Change from baseline, expected to last >3 days Walks in Home: Needs assistance Is this a change from baseline?: Change from baseline, expected to last >3 days Does the patient have difficulty walking or climbing stairs?: Yes Weakness of Legs: Both Weakness of Arms/Hands: Both  Permission Sought/Granted                  Emotional Assessment Appearance:: Appears stated age       Alcohol /  Substance Use: Not Applicable Psych Involvement: No (comment)  Admission diagnosis:  Surgery, elective [Z41.9] Musculoskeletal pain [M79.18] Cervical spine fracture ([place]) [S12.9XXA] Fall, initial encounter [id number]] Contusion of forehead, initial encounter [S00.83XA] Numbness of left thumb [R20.0] Dementia without behavioral disturbance, unspecified dementia type ([place]) [F03.90] Traumatic fracture of cervical spine ([place]) [S12.9XXA] Patient Active  Problem List   Diagnosis Date Noted  . Traumatic fracture of cervical spine ([place]) 06/03/2020  . Dementia without behavioral disturbance ([place])   . Forehead contusion    PCP:  Lesia Hausen, PA Pharmacy:   [facility] [place], [place] [address] [place] [address] Phone: [phone] Fax: [phone]     Social Determinants of Health (SDOH) Interventions    Readmission Risk Interventions No flowsheet data found.

## 2020-06-06 DIAGNOSIS — S129XXA Fracture of neck, unspecified, initial encounter: Secondary | ICD-10-CM | POA: Diagnosis not present

## 2020-06-06 LAB — BASIC METABOLIC PANEL
Anion gap: 13 (ref 5–15)
BUN: 23 mg/dL (ref 8–23)
CO2: 27 mmol/L (ref 22–32)
Calcium: 8.7 mg/dL — ABNORMAL LOW (ref 8.9–10.3)
Chloride: 104 mmol/L (ref 98–111)
Creatinine, Ser: 0.96 mg/dL (ref 0.61–1.24)
GFR, Estimated: >60 mL/min (ref >60)
Glucose, Bld: 152 mg/dL — ABNORMAL HIGH (ref 70–99)
Potassium: 4 mmol/L (ref 3.5–5.1)
Sodium: 144 mmol/L (ref 135–145)

## 2020-06-06 LAB — GLUCOSE, CAPILLARY
Glucose-Capillary: 149 mg/dL — ABNORMAL HIGH (ref 70–99)
Glucose-Capillary: 181 mg/dL — ABNORMAL HIGH (ref 70–99)
Glucose-Capillary: 185 mg/dL — ABNORMAL HIGH (ref 70–99)
Glucose-Capillary: 198 mg/dL — ABNORMAL HIGH (ref 70–99)
Glucose-Capillary: 234 mg/dL — ABNORMAL HIGH (ref 70–99)
Glucose-Capillary: 420 mg/dL — ABNORMAL HIGH (ref 70–99)

## 2020-06-06 LAB — URINALYSIS, COMPLETE (UACMP) WITH MICROSCOPIC
Bacteria, UA: NONE SEEN
Bilirubin Urine: NEGATIVE
Glucose, UA: >=500 mg/dL — AB
Hgb urine dipstick: NEGATIVE
Ketones, ur: 20 mg/dL — AB
Leukocytes,Ua: NEGATIVE
Nitrite: NEGATIVE
Protein, ur: 30 mg/dL — AB
Specific Gravity, Urine: 1.024 (ref 1.005–1.030)
pH: 5 (ref 5.0–8.0)

## 2020-06-06 LAB — CBC
HCT: 42.4 % (ref 39.0–52.0)
Hemoglobin: 14.1 g/dL (ref 13.0–17.0)
MCH: 29.1 pg (ref 26.0–34.0)
MCHC: 33.3 g/dL (ref 30.0–36.0)
MCV: 87.6 fL (ref 80.0–100.0)
Platelets: 169 10*3/uL (ref 150–400)
RBC: 4.84 MIL/uL (ref 4.22–5.81)
RDW: 13.2 % (ref 11.5–15.5)
WBC: 13 10*3/uL — ABNORMAL HIGH (ref 4.0–10.5)
nRBC: 0 % (ref 0.0–0.2)

## 2020-06-06 LAB — GLUCOSE, RANDOM: Glucose, Bld: 394 mg/dL — ABNORMAL HIGH (ref 70–99)

## 2020-06-06 MED ORDER — INSULIN ASPART 100 UNIT/ML ~~LOC~~ SOLN
0.0000 [IU] | Freq: Every day | SUBCUTANEOUS | Status: DC
  Administered 2020-06-06 – 2020-06-19 (×4): 2 [IU] via SUBCUTANEOUS
  Filled 2020-06-06 (×4): qty 1

## 2020-06-06 MED ORDER — INSULIN ASPART 100 UNIT/ML ~~LOC~~ SOLN
0.0000 [IU] | Freq: Three times a day (TID) | SUBCUTANEOUS | Status: DC
  Administered 2020-06-06 – 2020-06-08 (×3): 3 [IU] via SUBCUTANEOUS
  Administered 2020-06-08: 8 [IU] via SUBCUTANEOUS
  Administered 2020-06-09 – 2020-06-10 (×2): 5 [IU] via SUBCUTANEOUS
  Administered 2020-06-10: 2 [IU] via SUBCUTANEOUS
  Administered 2020-06-11 (×2): 3 [IU] via SUBCUTANEOUS
  Administered 2020-06-12: 2 [IU] via SUBCUTANEOUS
  Administered 2020-06-12: 3 [IU] via SUBCUTANEOUS
  Administered 2020-06-12 – 2020-06-14 (×4): 2 [IU] via SUBCUTANEOUS
  Administered 2020-06-14: 3 [IU] via SUBCUTANEOUS
  Administered 2020-06-14 – 2020-06-15 (×2): 2 [IU] via SUBCUTANEOUS
  Administered 2020-06-15 (×2): 3 [IU] via SUBCUTANEOUS
  Administered 2020-06-16: 5 [IU] via SUBCUTANEOUS
  Administered 2020-06-17: 8 [IU] via SUBCUTANEOUS
  Administered 2020-06-18: 5 [IU] via SUBCUTANEOUS
  Administered 2020-06-18: 2 [IU] via SUBCUTANEOUS
  Administered 2020-06-19: 3 [IU] via SUBCUTANEOUS
  Administered 2020-06-20: 5 [IU] via SUBCUTANEOUS
  Filled 2020-06-06 (×27): qty 1

## 2020-06-06 MED ORDER — INSULIN GLARGINE 100 UNIT/ML ~~LOC~~ SOLN
7.0000 [IU] | Freq: Two times a day (BID) | SUBCUTANEOUS | Status: DC
  Administered 2020-06-06 – 2020-06-20 (×28): 7 [IU] via SUBCUTANEOUS
  Filled 2020-06-06 (×33): qty 0.07

## 2020-06-06 NOTE — Progress Notes (Signed)
Physical Therapy Treatment Patient Details Name: Mitchell Stewart MRN: [id number] DOB: Jan 18, 1941 Today's Date: 06/06/2020    History of Present Illness Pt is a 79 y/o M with PMH: dementia, DM, HOH, HLD and prostate cancer who presented to [facility] ED s/p fall down flight of stairs. Pt found to have cervical spine fx and is now s/p C6-7 ACDF (06/03/2020).    PT Comments    Pt was oriented to self only. Upon entry, pt was found trying to get out of bed, with condom catheter pulled off, and neck in cervical R lateral flexion. Pt reported that he was trying to get home and asked for PT to take off the cervical brace. The cervical brace was very loose and was pushed superiorly over the nose. Nursing was notified about positioning and MD cleared taking off the brace to readjust. Pt was brought to sitting with Max A with 2+ to help maintain head position. The brace was then readjusted in sitting by the NP with the PT holding Pt's head. Intermittently throughout the session, the pt had mild full body tremors and pt has a palpable mass over the medical end of the R clavical. Nursing and MD were notified. Pt required Max +2 to return to supine. Pt performed rolling L and R with Max A to allow for linen changing. Pt will benefit from PT services in a SNF setting upon discharge to safely address deficits listed in patient problem list for decreased caregiver assistance and eventual return to PLOF.   Follow Up Recommendations  SNF     Equipment Recommendations  Rolling walker with 5" wheels    Recommendations for Other Services       Precautions / Restrictions Precautions Precautions: Fall;Cervical Precaution Comments: Brace on at all times Required Braces or Orthoses: Cervical Brace Cervical Brace: Hard collar Restrictions Other Position/Activity Restrictions: [place] J collar, HOB 30 degrees, okay per MD to take brace off temporaily to adjust brace (Pt's brace was found loose and pt's head was in R  lateral flexion upon arrival)    Mobility  Bed Mobility Overal bed mobility: Needs Assistance Bed Mobility: Rolling;Supine to Sit;Sit to Supine Rolling: Max assist   Supine to sit: Max assist     General bed mobility comments: pt does not consistantly following single step commands and only intermitantly participates in bed mobility  Transfers                 General transfer comment: not performed due to safety  Ambulation/Gait             General Gait Details: not performed due to safety   Stairs             Wheelchair Mobility    Modified Rankin (Stroke Patients Only)       Balance Overall balance assessment: Independent   Sitting balance-Leahy Scale: Poor Sitting balance - Comments: pt unable to maintian balance without Mod therapist support.       Standing balance comment: not assessed due to safety                            Cognition Arousal/Alertness: Lethargic Behavior During Therapy: WFL for tasks assessed/performed Overall Cognitive Status: History of cognitive impairments - at baseline                                 General  Comments: pt oriented to self only. pt found playing with condom cathater that pt had taken off      Exercises      General Comments General comments (skin integrity, edema, etc.): Mass present over medial end of the R clavical. Nursing and MD notified. Pt's posistion of comfort is in R lateral flexion. Pt able to self correct but will push against brace or PT to get into posistion of comfort      Pertinent Vitals/Pain Faces Pain Scale: Hurts a little bit Pain Location: generalized Pain Descriptors / Indicators: Grimacing;Moaning Pain Intervention(s): Limited activity within patient's tolerance;Monitored during session;Repositioned    Home Living                      Prior Function            PT Goals (current goals can now be found in the care plan section)  Acute Rehab PT Goals PT Goal Formulation: Patient unable to participate in goal setting Potential to Achieve Goals: Fair Progress towards PT goals: Not progressing toward goals - comment (pt has not progressed towards mobility goals)    Frequency    BID      PT Plan Current plan remains appropriate    Co-evaluation              AM-PAC PT "6 Clicks" Mobility   Outcome Measure  Help needed turning from your back to your side while in a flat bed without using bedrails?: Total Help needed moving from lying on your back to sitting on the side of a flat bed without using bedrails?: Total Help needed moving to and from a bed to a chair (including a wheelchair)?: A Little Help needed standing up from a chair using your arms (e.g., wheelchair or bedside chair)?: A Lot Help needed to walk in hospital room?: A Lot Help needed climbing 3-5 steps with a railing? : A Lot 6 Click Score: 11    End of Session Equipment Utilized During Treatment: Oxygen Activity Tolerance: Patient limited by lethargy Patient left: with bed alarm set;in bed;with call bell/phone within reach;with SCD's reapplied; 4 bed rails up per nursing request Nurse Communication: Mobility status;Other (comment) (MD and nursing notified about R medical clavicular mass and mild full body tremors) PT Visit Diagnosis: Muscle weakness (generalized) (M62.81);Unsteadiness on feet (R26.81);History of falling (Z91.81)     Time: 2878-6767 PT Time Calculation (min) (ACUTE ONLY): 49 min  Charges:                        Hervey Ard, SPT 06/06/20. 11:47 AM

## 2020-06-06 NOTE — Care Management Important Message (Signed)
Important Message  Patient Details  Name: Mitchell Stewart MRN: [id number] Date of Birth: 02/13/1941   Medicare Important Message Given:  Yes     [name] 06/06/2020, 1:14 PM

## 2020-06-06 NOTE — Progress Notes (Signed)
Physical Therapy Treatment Patient Details Name: Mitchell Stewart MRN: [id number] DOB: 1941/01/25 Today's Date: 06/06/2020    History of Present Illness Pt is a 79 y/o M with PMH: dementia, DM, HOH, HLD and prostate cancer who presented to [facility] ED s/p fall down flight of stairs. Pt found to have cervical spine fx and is now s/p C6-7 ACDF (06/03/2020).    PT Comments    Upon arrival, pt was oriented to self only. Pt was able to perform bed level therex inconsistently. Pt would occasionally follow simple commands but would often not respond to simple commands or verbal and tactile cueing. PT attempted to facilitate bed mobility but pt actively resisted. Pt was repositioned and PT left. Pt will benefit from PT services in a SNF setting upon discharge to safely address deficits listed in patient problem list for decreased caregiver assistance and eventual return to PLOF.   Follow Up Recommendations  SNF     Equipment Recommendations  Rolling walker with 5" wheels    Recommendations for Other Services       Precautions / Restrictions Precautions Precautions: Fall;Cervical Precaution Comments: Brace on at all times Required Braces or Orthoses: Cervical Brace Cervical Brace: Hard collar Restrictions Other Position/Activity Restrictions: [place] J collar, HOB 30 degrees, okay per MD to take brace off temporaily to adjust brace    Mobility  Bed Mobility Overal bed mobility: Needs Assistance Bed Mobility: Rolling;Supine to Sit;Sit to Supine Rolling: Max assist   Supine to sit: Max assist     General bed mobility comments: pt actively resisted bed mobility. bed level therex performed only  Transfers                 General transfer comment: not performed due to pt actively resisting  Ambulation/Gait             General Gait Details: not performed due to safety   Stairs             Wheelchair Mobility    Modified Rankin (Stroke Patients Only)        Balance Overall balance assessment: Independent   Sitting balance-Leahy Scale: Poor Sitting balance - Comments: pt unable to maintian balance without Mod therapist support.       Standing balance comment: not assessed due to safety                            Cognition Arousal/Alertness: Awake/alert Behavior During Therapy: WFL for tasks assessed/performed Overall Cognitive Status: Within Functional Limits for tasks assessed                                 General Comments: pt oriented to self. pt reports that the year is 1945 and he's at home      Exercises Total Joint Exercises Ankle Circles/Pumps: AAROM;Both;10 reps (resisted) Short Arc Quad: AROM;Both;10 reps (resisted) Heel Slides: AAROM;Strengthening;10 reps (resisted) Hip ABduction/ADduction: Both;10 reps;PROM Knee Flexion: AROM;10 reps;Both (Resisted)    General Comments General comments (skin integrity, edema, etc.): Mass present over medial end of the R clavical. Nursing and MD notified. Pt's posistion of comfort is in R lateral flexion. Pt able to self correct but will push against brace or PT to get into posistion of comfort      Pertinent Vitals/Pain Faces Pain Scale: Hurts a little bit Pain Location: stomach pain Pain Descriptors / Indicators: Grimacing;Moaning Pain  Intervention(s): Limited activity within patient's tolerance;Monitored during session;Repositioned    Home Living                      Prior Function            PT Goals (current goals can now be found in the care plan section) Acute Rehab PT Goals PT Goal Formulation: Patient unable to participate in goal setting Time For Goal Achievement: 06/17/20 Potential to Achieve Goals: Fair Progress towards PT goals: Not progressing toward goals - comment (pt is not fully participating in therapy and is limited cognition)    Frequency    BID      PT Plan Current plan remains appropriate    Co-evaluation               AM-PAC PT "6 Clicks" Mobility   Outcome Measure  Help needed turning from your back to your side while in a flat bed without using bedrails?: Total Help needed moving from lying on your back to sitting on the side of a flat bed without using bedrails?: Total Help needed moving to and from a bed to a chair (including a wheelchair)?: A Little Help needed standing up from a chair using your arms (e.g., wheelchair or bedside chair)?: A Lot Help needed to walk in hospital room?: A Lot Help needed climbing 3-5 steps with a railing? : A Lot 6 Click Score: 11    End of Session Equipment Utilized During Treatment: Oxygen Activity Tolerance: Other (comment) (pt limited by cognitive status. pt does not consistantly follow commands) Patient left: with bed alarm set;in bed;with call bell/phone within reach;with SCD's reapplied;Other (comment) (all 4 rails up per nursing request) Nurse Communication: Mobility status;Other (comment) (MD and nursing notified about R medical clavicular mass and mild full body tremors) PT Visit Diagnosis: Muscle weakness (generalized) (M62.81);Unsteadiness on feet (R26.81);History of falling (Z91.81)     Time: 3794-3276 PT Time Calculation (min) (ACUTE ONLY): 28 min  Charges:  $Therapeutic Activity: 38-52 mins                     Hervey Ard, SPT 06/06/20. 3:30 PM

## 2020-06-06 NOTE — TOC Progression Note (Addendum)
Transition of Care [facility]) - Progression Note    Patient Details  Name: Mitchell Stewart MRN: [id number] Date of Birth: March 26, 1941  Transition of Care [facility]) CM/SW [place], RN Phone Number: 06/06/2020, 11:43 AM  Clinical Narrative:     Additional information requested from [facility] for PASSR number:  Information shared with physician, will send to state when information requested is received.   INfo requested: " Provide the name and credentials of the provider that signed the 30-day note?; and MD note that Dementia is primary and supersedes mental illness with the physician's signature, date, and the patients DOB; and any behaviors the patient may present? Then Respond back in NCMUST. Thanks!"   Expected Discharge Plan: [place] Barriers to Discharge: No Barriers Identified  Expected Discharge Plan and Services Expected Discharge Plan: [name] Choice: [place] arrangements for the past 2 months: Single Family Home Expected Discharge Date: 06/06/20                                     Social Determinants of Health (SDOH) Interventions    Readmission Risk Interventions No flowsheet data found.

## 2020-06-06 NOTE — Progress Notes (Signed)
PROGRESS NOTE  Mitchell Stewart VOZ:[id number] DOB: [birth year] DOA: 06/03/2020 PCP: Lesia Hausen, PA  HPI/Recap of past 24 hours: Mitchell Stewart y.o.malewith a known history of type 2 diabetes mellitus, dyslipidemia, prostate cancer, dementia and chronic depression, presented to [facility] ED with acute onset of accidental mechanical fall after getting up to go to the bathroom and falling down a flight of 15 stairs landing on his back on concrete with subsequent neck pain as well as bilateral elbow pain. Patient has significant dementia and unable to provide a history or review of systems.  CT cervical spine wo contrast done on 06/03/20 prior to neurosurgery showed:  Acute fracture of the cervical spine predominantly involving the posterior elements of C6 with fracture bilateral pedicles and minimal anterolisthesis of C6 upon C7. The fracture pattern is suggestive of a hyperextension injury. Fractures also are identified involving the spinous process of C6, the left lamina of C6, and the left C7 superior articular facet extending into the left C6-7 facet which demonstrates mild relative widening.  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 Superimposed small epidural hematoma at the level of the fracture and at C5-6 slightly Superiorly.  CT head showed: No acute intracranial injury.   No calvarial fracture.  Seen by neurosurgery on 06/03/20 and was taken to the OR.  He had the following procedures: Procedure(s): ARTHRODESIS, ANTERIOR INTERBODY, INCLUDING DISC SPACE PREPARATION, DISCECTOMY, OSTEOPHYTECTOMY AND DECOMPRESSION OF SPINAL CORD AND/ORNERVE ROOTS; CERVICAL BELOW C2--C6-7ACDF ARTHRODESIS ANTERIOR INTERBODY W/DISCECTOMY ADDITIONAL FIFTH ANTERIOR INSTRUMENTATION; 2 TO 3 VERTEBRAL SEGMENTS (LIST IN ADDITION TO PRIMARY PROCEDURE) ALLOGRAFT, STRUCTURAL, FOR SPINE SURGERY ONLY (LIST IN ADDITION TO PRIMARY PROCEDURE)   SURGEON: Surgeon(s) and Role: * Malen Gauze, MD - Primary  Christine Zdeb,PA, Asisstant  ANESTHESIA:General   OPERATIVE FINDINGS: Fracture at C6-7     06/06/20: Seen and examined.Alert and pleasantly confused.  No behavioral issues.     Assessment/Plan: Active Problems:   Traumatic fracture of cervical spine (HCC)  Acute C6-C7 traumatic spine fracture secondary to mechanical fall post repair by neurosurgery on 06/03/20. - Continueimmobilizing his C-spine with neck collar -C-spine MRI -- unstable C6-7 fracture, was taken to the OR as stated above. -Pain management with PO pain meds -Neurosurgery consultation with Dr. Deetta Perla  -POD #3  fixation of C6/7 fracture  -PT OT recommended SNF and 24 hours assistance -- [facility] assisting with SNF placement.  Type II diabetesmellitus with hyperglycemia --A1C 7.6 on 06/03/20 -Increase Lantus dose to 7 units twice daily Increase insulin sliding scale to moderate  Dyslipidemia. -continue statin therapy.  Dementia without behavioral disturbance. No behavioral issues since admitted. Continue home Seroquel  Chronic depression Stable Continue home Zoloft  BPH. - continue Flomax. Monitor urine output  DVT prophylaxis. -Lovenox, subcu daily.  Resolved RUQ pain unclear etiology UA negative for pyuria.   CODE STATUS: Full code    Status is: Inpatient    Dispo: The patient is from:Home               Anticipated d/c is to: SNF              Anticipated d/c date is:06/07/20               Patient currently stable, pending SNF placement.        Objective: Vitals:   06/05/20 0739 06/05/20 1527 06/05/20 2322 06/06/20 0747  BP: (!) 147/87 139/80 (!) 148/83 (!) 146/87  Pulse: 100 90 100 (!) 106  Resp: 17 18 20  19  Temp: 98.6 F (37 C) 98.8 F (37.1 C) 98.9 F (37.2 C) 98.6 F (37 C)  TempSrc: Oral Oral Oral Axillary  SpO2: 98% 100% 95% 98%  Weight:      Height:        Intake/Output Summary (Last 24 hours) at 06/06/2020 1435 Last  data filed at 06/06/2020 1348 Gross per 24 hour  Intake 620 ml  Output --  Net 620 ml   Filed Weights   06/03/20 0121  Weight: 56.7 kg    Exam:  . General: 79 y.o. year-old male frail-appearing no acute distress.  Alert and pleasantly confused.  Neck collar in place. . Cardiovascular: Regular rate and rhythm no rubs or gallops. Marland Kitchen Respiratory: Clear to auscultation no wheezes or rales.   . Abdomen: Soft nontender normal bowel sounds present.   . Musculoskeletal: No lower extremity edema.   Marland Kitchen Psychiatry: Mood is appropriate for condition   Data Reviewed: CBC: Recent Labs  Lab 06/03/20 0320 06/03/20 2227 06/05/20 1519 06/06/20 0425  WBC 12.1* 11.1* 16.6* 13.0*  NEUTROABS 10.4*  --  15.3*  --   HGB 14.7 14.1 14.5 14.1  HCT 43.9 42.2 42.1 42.4  MCV 87.8 88.1 85.1 87.6  PLT 138* 158 166 833   Basic Metabolic Panel: Recent Labs  Lab 06/03/20 0320 06/03/20 2227 06/05/20 1519 06/06/20 0426 06/06/20 1245  NA 139 138 139 144  --   K 3.4* 3.8 3.8 4.0  --   CL 105 105 104 104  --   CO2 20* 19* 23 27  --   GLUCOSE 148* 151* 219* 152* 394*  BUN 27* 15 23 23   --   CREATININE 1.17 0.85 0.92 0.96  --   CALCIUM 9.0 8.2* 8.7* 8.7*  --    GFR: Estimated Creatinine Clearance: 50.9 mL/min (by C-G formula based on SCr of 0.96 mg/dL). Liver Function Tests: Recent Labs  Lab 06/05/20 1519  AST 26  ALT 18  ALKPHOS 62  BILITOT 1.3*  PROT 6.7  ALBUMIN 3.6   No results for input(s): LIPASE, AMYLASE in the last 168 hours. No results for input(s): AMMONIA in the last 168 hours. Coagulation Profile: Recent Labs  Lab 06/03/20 0320  INR 1.0   Cardiac Enzymes: No results for input(s): CKTOTAL, CKMB, CKMBINDEX, TROPONINI in the last 168 hours. BNP (last 3 results) No results for input(s): PROBNP in the last 8760 hours. HbA1C: Recent Labs    06/03/20 2227  HGBA1C 7.6*   CBG: Recent Labs  Lab 06/05/20 2014 06/06/20 0007 06/06/20 0425 06/06/20 0753 06/06/20 1201   GLUCAP 194* 181* 149* 198* 420*   Lipid Profile: No results for input(s): CHOL, HDL, LDLCALC, TRIG, CHOLHDL, LDLDIRECT in the last 72 hours. Thyroid Function Tests: No results for input(s): TSH, T4TOTAL, FREET4, T3FREE, THYROIDAB in the last 72 hours. Anemia Panel: No results for input(s): VITAMINB12, FOLATE, FERRITIN, TIBC, IRON, RETICCTPCT in the last 72 hours. Urine analysis:    Component Value Date/Time   COLORURINE YELLOW (A) 06/06/2020 0739   APPEARANCEUR CLOUDY (A) 06/06/2020 0739   LABSPEC 1.024 06/06/2020 0739   PHURINE 5.0 06/06/2020 0739   GLUCOSEU >=500 (A) 06/06/2020 0739   HGBUR NEGATIVE 06/06/2020 0739   BILIRUBINUR NEGATIVE 06/06/2020 0739   KETONESUR 20 (A) 06/06/2020 0739   PROTEINUR 30 (A) 06/06/2020 0739   NITRITE NEGATIVE 06/06/2020 0739   LEUKOCYTESUR NEGATIVE 06/06/2020 0739   Sepsis Labs: @LABRCNTIP (procalcitonin:4,lacticidven:4)  ) Recent Results (from the past 240 hour(s))  Respiratory Panel by  RT PCR (Flu A&B, Covid) - Nasopharyngeal Swab     Status: None   Collection Time: 06/03/20  3:20 AM   Specimen: Nasopharyngeal Swab  Result Value Ref Range Status   SARS Coronavirus 2 by RT PCR NEGATIVE NEGATIVE Final    Comment: (NOTE) SARS-CoV-2 target nucleic acids are NOT DETECTED.  The SARS-CoV-2 RNA is generally detectable in upper respiratoy specimens during the acute phase of infection. The lowest concentration of SARS-CoV-2 viral copies this assay can detect is 131 copies/mL. A negative result does not preclude SARS-Cov-2 infection and should not be used as the sole basis for treatment or other patient management decisions. A negative result may occur with  improper specimen collection/handling, submission of specimen other than nasopharyngeal swab, presence of viral mutation(s) within the areas targeted by this assay, and inadequate number of viral copies (<131 copies/mL). A negative result must be combined with clinical observations, patient  history, and epidemiological information. The expected result is Negative.  Fact Sheet for Patients:  PinkCheek.be  Fact Sheet for Healthcare Providers:  GravelBags.it  This test is no t yet approved or cleared by the [place] FDA and  has been authorized for detection and/or diagnosis of SARS-CoV-2 by FDA under an Emergency Use Authorization (EUA). This EUA will remain  in effect (meaning this test can be used) for the duration of the COVID-19 declaration under Section 564(b)(1) of the Act, 21 U.S.C. section 360bbb-3(b)(1), unless the authorization is terminated or revoked sooner.     Influenza A by PCR NEGATIVE NEGATIVE Final   Influenza B by PCR NEGATIVE NEGATIVE Final    Comment: (NOTE) The Xpert Xpress SARS-CoV-2/FLU/RSV assay is intended as an aid in  the diagnosis of influenza from Nasopharyngeal swab specimens and  should not be used as a sole basis for treatment. Nasal washings and  aspirates are unacceptable for Xpert Xpress SARS-CoV-2/FLU/RSV  testing.  Fact Sheet for Patients: PinkCheek.be  Fact Sheet for Healthcare Providers: GravelBags.it  This test is not yet approved or cleared by the [place] FDA and  has been authorized for detection and/or diagnosis of SARS-CoV-2 by  FDA under an Emergency Use Authorization (EUA). This EUA will remain  in effect (meaning this test can be used) for the duration of the  Covid-19 declaration under Section 564(b)(1) of the Act, 21  U.S.C. section 360bbb-3(b)(1), unless the authorization is  terminated or revoked. Performed at [facility], [address] [place], [place] [address]       Studies: No results found.  Scheduled Meds: . Chlorhexidine Gluconate Cloth  6 each Topical Daily  . enoxaparin (LOVENOX) injection  40 mg Subcutaneous Q24H  . insulin aspart  0-15 Units Subcutaneous  TID WC  . insulin aspart  0-5 Units Subcutaneous QHS  . insulin glargine  7 Units Subcutaneous BID  . pravastatin  20 mg Oral q1800  . QUEtiapine  25 mg Oral QHS  . sertraline  150 mg Oral Daily  . tamsulosin  0.4 mg Oral Daily    Continuous Infusions: . sodium chloride 1,000 mL (06/04/20 1502)  . methocarbamol (ROBAXIN) IV Stopped (06/04/20 0700)     LOS: 3 days     Kayleen Memos, MD [facility] Pager [phone]  If 7PM-7AM, please contact night-coverage [email or web address] Password [email or web address] 06/06/2020, 2:35 PM

## 2020-06-06 NOTE — TOC Progression Note (Signed)
Transition of Care [facility]) - Progression Note    Patient Details  Name: Mitchell Stewart MRN: [id number] Date of Birth: [birth year]  [facility] CM/SW Contact  Shelbie Hutching, RN Phone Number: 06/06/2020, 1:37 PM  Clinical Narrative:    RNCM spoke with patient's son, Mitchell Stewart, to present bed offers.  Only one facility has offered a bed, [facility], Mitchell Stewart accepts bed offer.  Waiting to see if [facility] accept today.    Expected Discharge Plan: [place] Barriers to Discharge: No Barriers Identified  Expected Discharge Plan and Services Expected Discharge Plan: [place] Choice: [place] arrangements for the past 2 months: Single Family Home Expected Discharge Date: 06/06/20                                     Social Determinants of Health (SDOH) Interventions    Readmission Risk Interventions No flowsheet data found.

## 2020-06-06 NOTE — Progress Notes (Signed)
   Progress Note   Date: 06/06/2020  Mr Brightwell is POD#3 from C6/7 ACDF  Overnight Events: 10/14: Pt seen this morning, resting in bed. Denies pain. Moving all extremities, 5/5 strength to bilateral UE/LE. Difficulty following commands today but this appears to be baseline per family. Neck incision C/D/I.  10/13: None  10/12 TWS5:KCLEXNT was uncomplicated. Patient slow to wake up from anesthesia. CT of head and cervical spine unremarkable.  This morning, he appears to be at baseline with confusion. He is not endorsing any severe neck pain but does have continued shoulder pain he presented with.   Vital Signs: Temp:  [98.6 F (37 C)-98.9 F (37.2 C)] 98.6 F (37 C) (10/14 0747) Pulse Rate:  [90-106] 106 (10/14 0747) Resp:  [18-20] 19 (10/14 0747) BP: (139-148)/(80-87) 146/87 (10/14 0747) SpO2:  [95 %-100 %] 98 % (10/14 0747) Temp (24hrs), Avg:98.8 F (37.1 C), Min:98.6 F (37 C), Max:98.9 F (37.2 C)  Weight: 56.7 kg  Problem List Patient Active Problem List   Diagnosis Date Noted  . Traumatic fracture of cervical spine ([place]) 06/03/2020  . Dementia without behavioral disturbance ([place])   . Forehead contusion     Medications: Scheduled Meds: . Chlorhexidine Gluconate Cloth  6 each Topical Daily  . enoxaparin (LOVENOX) injection  40 mg Subcutaneous Q24H  . insulin aspart  0-9 Units Subcutaneous Q4H  . insulin glargine  7 Units Subcutaneous Daily  . pravastatin  20 mg Oral q1800  . QUEtiapine  25 mg Oral QHS  . sertraline  150 mg Oral Daily  . tamsulosin  0.4 mg Oral Daily   Continuous Infusions: . sodium chloride 1,000 mL (06/04/20 1502)  . methocarbamol (ROBAXIN) IV Stopped (06/04/20 0700)   PRN Meds:.sodium chloride, acetaminophen, magnesium hydroxide, methocarbamol (ROBAXIN) IV, ondansetron **OR** ondansetron (ZOFRAN) IV, oxyCODONE, traMADol  Labs:  Lab Results  Component Value Date   WBC 13.0 (H) 06/06/2020   WBC 16.6 (H) 06/05/2020   HCT 42.4 06/06/2020    HCT 42.1 06/05/2020   PLT 169 06/06/2020   PLT 166 06/05/2020    Lab Results  Component Value Date   INR 1.0 06/03/2020    Lab Results  Component Value Date   NA 144 06/06/2020   NA 139 06/05/2020   K 4.0 06/06/2020   K 3.8 06/05/2020   BUN 23 06/06/2020   BUN 23 06/05/2020   No results found for: MG  Exam: Neurologic exam:  Mental status: alertness: alert, disoriented affect: normal Speech: fluent and clear Neck: Incision flat, Collar on Motor:strength symmetric 4/5 in grip and 5/5 in biceps and triceps at full effort, shoulder pain but is antigravity. He has 5/5 strength in lower extremity hip flexion, dorsiflexion, plantar flexion Sensory: intact to light touch in all extremities Gait: not tested  A/P: Mr Cull[place]ane is POD  from C6/7 ACDF for cervical spine fracture  #s/p ACDF - Continue [place] J collar at all time - OK to work with physical therapy - Cleared for DVT prophylaxis - Will plan f/u in clinic in 3 weeks   Patient will need long term placement given injury and dementia preventing discharge home.     Lonell Face, NP Neurosurgery

## 2020-06-07 DIAGNOSIS — S129XXA Fracture of neck, unspecified, initial encounter: Secondary | ICD-10-CM | POA: Diagnosis not present

## 2020-06-07 LAB — GLUCOSE, CAPILLARY
Glucose-Capillary: 199 mg/dL — ABNORMAL HIGH (ref 70–99)
Glucose-Capillary: 198 mg/dL — ABNORMAL HIGH (ref 70–99)
Glucose-Capillary: 179 mg/dL — ABNORMAL HIGH (ref 70–99)

## 2020-06-07 MED ORDER — TRAMADOL HCL 50 MG PO TABS
50.0000 mg | ORAL_TABLET | Freq: Two times a day (BID) | ORAL | 0 refills | Status: DC | PRN
Start: 2020-06-07 — End: 2020-06-20

## 2020-06-07 MED ORDER — POLYETHYLENE GLYCOL 3350 17 G PO PACK: 17.0000 g | PACK | Freq: Every day | ORAL | 0 refills | Status: AC | PRN

## 2020-06-07 MED ORDER — PRAVASTATIN SODIUM 20 MG PO TABS
20.0000 mg | ORAL_TABLET | Freq: Every day | ORAL | 0 refills | Status: DC
Start: 2020-06-07 — End: 2020-08-03

## 2020-06-07 NOTE — Progress Notes (Addendum)
Patient's Dementia diagnosis is his primary impairment diagnosis and supercedes his Depression.   Patient DOB- 11/07/1940  Jhonnie Garner, RN CM

## 2020-06-07 NOTE — Plan of Care (Signed)
No acute events overnight. Pt denies pain. Problem: Clinical Measurements: Goal: Ability to maintain clinical measurements within normal limits will improve Outcome: Progressing Goal: Will remain free from infection Outcome: Progressing Goal: Diagnostic test results will improve Outcome: Progressing Goal: Respiratory complications will improve Outcome: Progressing Goal: Cardiovascular complication will be avoided Outcome: Progressing   Problem: Activity: Goal: Risk for activity intolerance will decrease Outcome: Progressing   Problem: Nutrition: Goal: Adequate nutrition will be maintained Outcome: Progressing   Problem: Coping: Goal: Level of anxiety will decrease Outcome: Progressing   Problem: Elimination: Goal: Will not experience complications related to bowel motility Outcome: Progressing Goal: Will not experience complications related to urinary retention Outcome: Progressing   Problem: Pain Managment: Goal: General experience of comfort will improve Outcome: Progressing   Problem: Safety: Goal: Ability to remain free from injury will improve Outcome: Progressing   Problem: Skin Integrity: Goal: Risk for impaired skin integrity will decrease Outcome: Progressing   Problem: Education: Goal: Ability to verbalize activity precautions or restrictions will improve Outcome: Progressing Goal: Knowledge of the prescribed therapeutic regimen will improve Outcome: Progressing Goal: Understanding of discharge needs will improve Outcome: Progressing   Problem: Activity: Goal: Ability to avoid complications of mobility impairment will improve Outcome: Progressing Goal: Ability to tolerate increased activity will improve Outcome: Progressing Goal: Will remain free from falls Outcome: Progressing   Problem: Bowel/Gastric: Goal: Gastrointestinal status for postoperative course will improve Outcome: Progressing   Problem: Clinical Measurements: Goal: Ability to  maintain clinical measurements within normal limits will improve Outcome: Progressing Goal: Postoperative complications will be avoided or minimized Outcome: Progressing Goal: Diagnostic test results will improve Outcome: Progressing   Problem: Pain Management: Goal: Pain level will decrease Outcome: Progressing   Problem: Skin Integrity: Goal: Will show signs of wound healing Outcome: Progressing   Problem: Health Behavior/Discharge Planning: Goal: Identification of resources available to assist in meeting health care needs will improve Outcome: Progressing   Problem: Bladder/Genitourinary: Goal: Urinary functional status for postoperative course will improve Outcome: Progressing

## 2020-06-07 NOTE — TOC Progression Note (Signed)
Transition of Care [facility]) - Progression Note    Patient Details  Name: Mitchell Stewart MRN: [id number] Date of Birth: [birth year]  [facility] CM/SW Contact  Shelbie Hutching, RN Phone Number: 06/07/2020, 11:24 AM  Clinical Narrative:    RNCM updated patient's son that [facility] is not in network with his insurance plan.  RNCM has expanded the bed search.  [facility] would be okay with patient in [place] or close to [place].  [facility] has an in network facility in [place] and they are checking on bed availability.  Patient has been vaccinated against COVID.   RNCM will cont to keep patient's son updated.    Expected Discharge Plan: [place] Barriers to Discharge: No Barriers Identified  Expected Discharge Plan and Services Expected Discharge Plan: [place] Choice: [place] arrangements for the past 2 months: Single Family Home Expected Discharge Date: 06/07/20                                     Social Determinants of Health (SDOH) Interventions    Readmission Risk Interventions No flowsheet data found.

## 2020-06-07 NOTE — TOC Progression Note (Signed)
Transition of Care [facility]) - Progression Note    Patient Details  Name: Mitchell Stewart MRN: [id number] Date of Birth: [birth year]  Transition of Care [facility]) CM/SW Contact  Shelbie Hutching, RN Phone Number: 06/07/2020, 3:37 PM  Clinical Narrative:    [facility] is still checking to see if their [facility] facility can accept the patient.     Expected Discharge Plan: [place] Barriers to Discharge: No Barriers Identified  Expected Discharge Plan and Services Expected Discharge Plan: [place] Choice: [employer] arrangements for the past 2 months: Single Family Home Expected Discharge Date: 06/07/20                                     Social Determinants of Health (SDOH) Interventions    Readmission Risk Interventions No flowsheet data found.

## 2020-06-07 NOTE — Progress Notes (Signed)
Physical Therapy Treatment Patient Details Name: Mitchell Stewart MRN: [id number] DOB: Jun 02, 1941 Today's Date: 06/07/2020    History of Present Illness Pt is a 79 y/o M with PMH: dementia, DM, HOH, HLD and prostate cancer who presented to [facility] ED s/p fall down flight of stairs. Pt found to have cervical spine fx and is now s/p C6-7 ACDF (06/03/2020).    PT Comments    Pt was supine in bed with Hob elevataed ~ 30 degrees. He is awake but only oriented to self. Author had entered room while OT was treating earlier this afternoon and pt's alertness and ability to follow commands much improved. He continues to wax/wean with cognition and physical abilities. He had severe R lateral lean in AM session but this afternoon has slight left lateral lean. Max assist to exit R side of bed. Did stand 2 x  EOB to RW and was able to clear R/L LE with max assist to lateral wt shift and max vcs. Pt tolerated session well. Urinated 300 ml once returned to bed. He will benefit from SNF at DC to address deficits and improve safe functional mobility. He was supine in bed with HOB elevated 30 degrees and bed alarm in place at conclusion of session. Acute PT will continue to follow per POC progressing as able.     Follow Up Recommendations  SNF     Equipment Recommendations  Rolling walker with 5" wheels    Recommendations for Other Services       Precautions / Restrictions Precautions Precautions: Fall;Cervical Precaution Comments: Brace on at all times (can be applied seated EOB per order) Required Braces or Orthoses: Cervical Brace Cervical Brace: Hard collar ([place]) Restrictions Weight Bearing Restrictions: No Other Position/Activity Restrictions: [place] J collar, HOB 30 degrees, okay per MD to take brace off temporaily to adjust brace    Mobility  Bed Mobility Overal bed mobility: Needs Assistance Bed Mobility: Rolling;Supine to Sit;Sit to Supine;Sidelying to Sit;Sit to Sidelying Rolling: Max  assist Sidelying to sit: Max assist Supine to sit: Max assist Sit to supine: Max assist Sit to sidelying: Max assist General bed mobility comments: requires MOD verbal/tactile cues to initiate movement and for hand/foot placement to attempt to come to sitting at EOB.  Transfers Overall transfer level: Needs assistance Equipment used: Rolling walker (2 wheeled) Transfers: Sit to/from Stand Sit to Stand: Mod assist;Max assist;From elevated surface         General transfer comment: pt was able to stand 2 x EOB with only +1 assist.   Ambulation/Gait             General Gait Details: unsafe to progress Away from EOB.max assist to clear R/L LLE with max assist lateral wt shifting R/L       Balance Overall balance assessment: Needs assistance Sitting-balance support: Feet supported;Bilateral upper extremity supported Sitting balance-Leahy Scale: Poor Sitting balance - Comments: pt demonstrated much improved sitting balance from earlier session however still has poor balance. Postural control: Right lateral lean Standing balance support: Bilateral upper extremity supported Standing balance-Leahy Scale: Poor Standing balance comment: Reliant on BUE support + max assist                             Cognition Arousal/Alertness: Awake/alert Behavior During Therapy: WFL for tasks assessed/performed Overall Cognitive Status: History of cognitive impairments - at baseline  General Comments: Pt is oriented to self only      Exercises Other Exercises Other Exercises: OT engages pt in bed level self-drinking with MOD/MAX A hand over hand to use both hands to participate in sipping from straw x4 trials. Pt requires MAX verbal/tactile cues for general participation as well as tactile cue to mouth while sipping through straw to initiate sucking to get beverage. In addition, OT attempts to engage pt in sup<>sit, pt makes some effort  to participate when offered MAX verbal/tactile cues, but ultiamtely requires MAX A for both coming to sit, and for back to bed. Pt somewhat more drowsy this session which impacts participation during treatment session. Left with bed alarm set, HOB >30 degrees, brace positioned correctly throughout session. Will continue to follow.    General Comments General comments (skin integrity, edema, etc.): Pt still presents with mass over medial aspect of R clavicle, does not appear to bother him and is not making contact with hard collar.      Pertinent Vitals/Pain Pain Assessment: No/denies pain Faces Pain Scale: No hurt Pain Location: L shoulder with mobility Pain Descriptors / Indicators: Grimacing;Guarding Pain Intervention(s): Limited activity within patient's tolerance;Monitored during session;Repositioned           PT Goals (current goals can now be found in the care plan section) Acute Rehab PT Goals Patient Stated Goal: none stated Progress towards PT goals: Progressing toward goals    Frequency    BID      PT Plan Current plan remains appropriate       AM-PAC PT "6 Clicks" Mobility   Outcome Measure  Help needed turning from your back to your side while in a flat bed without using bedrails?: A Lot Help needed moving from lying on your back to sitting on the side of a flat bed without using bedrails?: A Lot Help needed moving to and from a bed to a chair (including a wheelchair)?: A Lot Help needed standing up from a chair using your arms (e.g., wheelchair or bedside chair)?: A Lot Help needed to walk in hospital room?: A Lot Help needed climbing 3-5 steps with a railing? : Total 6 Click Score: 11    End of Session Equipment Utilized During Treatment: Gait belt Activity Tolerance: Patient tolerated treatment well Patient left: with bed alarm set;in bed;with call bell/phone within reach;with SCD's reapplied;Other (comment);with family/visitor present Nurse  Communication: Mobility status PT Visit Diagnosis: Muscle weakness (generalized) (M62.81);Unsteadiness on feet (R26.81);History of falling (Z91.81)     Time: 3094-0768 PT Time Calculation (min) (ACUTE ONLY): 20 min  Charges:  $Therapeutic Activity: 8-22 mins                     Julaine Fusi PTA 06/07/20, 4:21 PM

## 2020-06-07 NOTE — Progress Notes (Signed)
Physical Therapy Treatment Patient Details Name: Mitchell Stewart MRN: [id number] DOB: July 16, 1941 Today's Date: 06/07/2020    History of Present Illness Pt is a 79 y/o M with PMH: dementia, DM, HOH, HLD and prostate cancer who presented to [facility] ED s/p fall down flight of stairs. Pt found to have cervical spine fx and is now s/p C6-7 ACDF (06/03/2020).    PT Comments     Pt was long sitting in bed awake, He greets therapist but is disoriented. Only oriented to self. Unaware of deficits. Per RN, cognition is about the same as previously date. He inconsistently is able to follow commands with extra time to process and constant tcs/vcs for improved safety. Was able to log roll R to short sit. Mod assist to roll but max assist to achieve EOB short sit.Poor sitting balance.adjusted cervical collar at EOB.  Required constant assistance to prevent falling to Rt. +2 for safety to STS EOB to RW. Stood ~ 4 minutes. Max assist to maintain sitting with sever Rt lateral lean. Pt has poor endurance. Will continue to benefit from skilled PT going forward. Recommend DC to SNF to address deficits and assist pt to PLOF. At conclusion of session, pt was in bed with RN in rm and bed alarm in place.    Follow Up Recommendations  SNF     Equipment Recommendations  Rolling walker with 5" wheels    Recommendations for Other Services       Precautions / Restrictions Precautions Precautions: Fall;Cervical Precaution Comments: Brace on at all times (per ordercan be applied at EOB however  on pre/post session) Required Braces or Orthoses: Cervical Brace Cervical Brace: Hard collar;Other (comment) ([place]) Restrictions Weight Bearing Restrictions: No Other Position/Activity Restrictions: [place] J collar, HOB 30 degrees, okay per MD to take brace off temporaily to adjust brace    Mobility  Bed Mobility Overal bed mobility: Needs Assistance Bed Mobility: Rolling;Supine to Sit;Sit to Supine Rolling: Mod assist    Supine to sit: Max assist Sit to supine: Max assist   General bed mobility comments: pt very slow moving requiring increased time to perform all mobility. vc/tcs throughout.  Transfers Overall transfer level: Needs assistance Equipment used: Rolling walker (2 wheeled) Transfers: Sit to/from Stand Sit to Stand: +2 physical assistance;+2 safety/equipment;From elevated surface;Min assist;Mod assist         General transfer comment: pt performed standing EOB with +2 assist for safety.   Ambulation/Gait             General Gait Details: unsafe to ambulate        Balance Overall balance assessment: Independent Sitting-balance support: Feet unsupported Sitting balance-Leahy Scale: Poor Sitting balance - Comments: constant min-mod assist + max vcs to maintain seated balance at EOB     Standing balance-Leahy Scale: Poor Standing balance comment: pt stood ~ 3-4 minutes EOB with max assist of one throughout to prevent LOB to Rt         Cognition Arousal/Alertness: Awake/alert Behavior During Therapy: WFL for tasks assessed/performed Overall Cognitive Status: No family/caregiver present to determine baseline cognitive functioning (pt is oriented to self only. inconsistenmtly follows command)      General Comments: Pt is alert but confused. Only oriented to self              Pertinent Vitals/Pain Pain Assessment: No/denies pain Faces Pain Scale: No hurt Pain Descriptors / Indicators: Grimacing;Discomfort Pain Intervention(s): Monitored during session;Limited activity within patient's tolerance;Repositioned  PT Goals (current goals can now be found in the care plan section) Acute Rehab PT Goals Patient Stated Goal: none stated Progress towards PT goals: Progressing toward goals (slow progress 2/2 to cognition)    Frequency    BID      PT Plan Current plan remains appropriate       AM-PAC PT "6 Clicks" Mobility   Outcome Measure  Help  needed turning from your back to your side while in a flat bed without using bedrails?: A Lot Help needed moving from lying on your back to sitting on the side of a flat bed without using bedrails?: A Lot Help needed moving to and from a bed to a chair (including a wheelchair)?: A Lot Help needed standing up from a chair using your arms (e.g., wheelchair or bedside chair)?: A Lot Help needed to walk in hospital room?: A Lot Help needed climbing 3-5 steps with a railing? : Total 6 Click Score: 11    End of Session Equipment Utilized During Treatment: Gait belt Activity Tolerance: Patient tolerated treatment well Patient left: with bed alarm set;in bed;with call bell/phone within reach;with SCD's reapplied;Other (comment);with family/visitor present Nurse Communication: Mobility status PT Visit Diagnosis: Muscle weakness (generalized) (M62.81);Unsteadiness on feet (R26.81);History of falling (Z91.81)     Time: 2103-1281 PT Time Calculation (min) (ACUTE ONLY): 11 min  Charges:  $Therapeutic Activity: 8-22 mins                     Julaine Fusi PTA 06/07/20, 11:37 AM

## 2020-06-07 NOTE — TOC Progression Note (Signed)
Transition of Care [facility]) - Progression Note    Patient Details  Name: Mitchell Stewart MRN: [id number] Date of Birth: 1941-03-20  Transition of Care [facility] CM/SW Contact  Shelbie Hutching, RN Phone Number: 06/07/2020, 5:49 PM  Clinical Narrative:    [facility] in [place] is unable to offer a bed.  Message left for [facility] in [place], they have offered a bed in the hub.  TOC team will cont to work on bed placement.    Expected Discharge Plan: [place] Barriers to Discharge: No Barriers Identified  Expected Discharge Plan and Services Expected Discharge Plan: [place] Choice: [place] arrangements for the past 2 months: Single Family Home Expected Discharge Date: 06/07/20                                     Social Determinants of Health (SDOH) Interventions    Readmission Risk Interventions No flowsheet data found.

## 2020-06-07 NOTE — Progress Notes (Signed)
Occupational Therapy Treatment Patient Details Name: Mitchell Stewart MRN: [id number] DOB: 1940-10-22 Today's Date: 06/07/2020    History of present illness Pt is a 79 y/o M with PMH: dementia, DM, HOH, HLD and prostate cancer who presented to [facility] ED s/p fall down flight of stairs. Pt found to have cervical spine fx and is now s/p C6-7 ACDF (06/03/2020).   OT comments  Pt seen for OT treatment this date to f/u re: safety with ADLs/ADL mobility. OT engages pt in bed level self-drinking with MOD/MAX A hand over hand to use both hands to participate in sipping from straw x4 trials. Pt requires MAX verbal/tactile cues for general participation as well as tactile cue to mouth while sipping through straw to initiate sucking to get beverage. In addition, OT attempts to engage pt in sup<>sit, pt makes some effort to participate when offered MAX verbal/tactile cues, but ultiamtely requires MAX A for both coming to sit, and for back to bed. Pt somewhat more drowsy this session which impacts participation during treatment session. Left with bed alarm set, HOB >30 degrees, brace positioned correctly throughout session. Will continue to follow. Continue to anticipate SNF is most appropriate d/c recommendation given pt's current fxl status versus PLOF.    Follow Up Recommendations  SNF;Supervision/Assistance - 24 hour    Equipment Recommendations  Other (comment) (defer to next level of care)    Recommendations for Other Services      Precautions / Restrictions Precautions Precautions: Fall;Cervical Precaution Comments: Brace on at all times (can be applied seated EOB per order) Required Braces or Orthoses: Cervical Brace Cervical Brace: Hard collar;Other (comment) ([place]) Restrictions Weight Bearing Restrictions: No Other Position/Activity Restrictions: [place] J collar, HOB 30 degrees, okay per MD to take brace off temporaily to adjust brace       Mobility Bed Mobility Overal bed mobility: Needs  Assistance Bed Mobility: Supine to Sit;Sit to Supine     Supine to sit: Max assist Sit to supine: Max assist   General bed mobility comments: requires MOD verbal/tactile cues to initiate movement and for hand/foot placement to attempt to come to sitting at EOB.  Transfers                      Balance Overall balance assessment: Needs assistance Sitting-balance support: Feet supported;Bilateral upper extremity supported Sitting balance-Leahy Scale: Zero Sitting balance - Comments: MAX A to sustain static sit this date. Postural control: Right lateral lean     Standing balance comment: NT                           ADL either performed or assessed with clinical judgement   ADL Overall ADL's : Needs assistance/impaired Eating/Feeding: Moderate assistance;Maximal assistance;Bed level Eating/Feeding Details (indicate cue type and reason): HOB elevated, hand over hand, MOD tactile/verbal cues to initiate (tactile cue to lips to remember to suck through straw) to take sips from cup with lid x4.                                         Vision Baseline Vision/History: Cataracts Patient Visual Report: No change from baseline Additional Comments: pt with minimal visual attn this session, requires consistent cueing   Perception     Praxis      Cognition Arousal/Alertness: Awake/alert Behavior During Therapy: [facility] for tasks  assessed/performed Overall Cognitive Status: No family/caregiver present to determine baseline cognitive functioning                                 General Comments: Pt oriented only to self, follows simple 1-step commands inconsistently, requires MOD/MAX verbal/tactile cues to inititate most aspects of participation this date.        Exercises Other Exercises Other Exercises: OT engages pt in bed level self-drinking with MOD/MAX A hand over hand to use both hands to participate in sipping from straw x4 trials.  Pt requires MAX verbal/tactile cues for general participation as well as tactile cue to mouth while sipping through straw to initiate sucking to get beverage. In addition, OT attempts to engage pt in sup<>sit, pt makes some effort to participate when offered MAX verbal/tactile cues, but ultiamtely requires MAX A for both coming to sit, and for back to bed. Pt somewhat more drowsy this session which impacts participation during treatment session. Left with bed alarm set, HOB >30 degrees, brace positioned correctly throughout session. Will continue to follow.   Shoulder Instructions       General Comments Pt still presents with mass over medial aspect of R clavicle, does not appear to bother him and is not making contact with hard collar.    Pertinent Vitals/ Pain       Pain Assessment: Faces Faces Pain Scale: Hurts little more Pain Location: L shoulder with mobility Pain Descriptors / Indicators: Grimacing;Guarding Pain Intervention(s): Limited activity within patient's tolerance;Monitored during session;Repositioned  Home Living                                          Prior Functioning/Environment              Frequency  Min 1X/week        Progress Toward Goals  OT Goals(current goals can now be found in the care plan section)  Progress towards OT goals: Progressing toward goals  Acute Rehab OT Goals Patient Stated Goal: none stated OT Goal Formulation: With family Time For Goal Achievement: 06/18/20 Potential to Achieve Goals: [place] Discharge plan remains appropriate    Co-evaluation                 AM-PAC OT "6 Clicks" Daily Activity     Outcome Measure   Help from another person eating meals?: A Lot Help from another person taking care of personal grooming?: A Lot Help from another person toileting, which includes using toliet, bedpan, or urinal?: A Lot Help from another person bathing (including washing, rinsing, drying)?:  Total Help from another person to put on and taking off regular upper body clothing?: A Lot Help from another person to put on and taking off regular lower body clothing?: Total 6 Click Score: 10    End of Session Equipment Utilized During Treatment: Cervical collar  OT Visit Diagnosis: Unsteadiness on feet (R26.81);Other abnormalities of gait and mobility (R26.89);Repeated falls (R29.6);Muscle weakness (generalized) (M62.81);History of falling (Z91.81)   Activity Tolerance Patient limited by fatigue;Patient limited by pain;Other (comment)   Patient Left in bed;with call bell/phone within reach;with bed alarm set;Other (comment) (HOB >30 degress)   Nurse Communication          Time: 5462-7035 OT Time Calculation (min): 23 min  Charges: OT General Charges $  OT Visit: 1 Visit OT Treatments $Self Care/Home Management : 8-22 mins $Therapeutic Activity: 8-22 mins  Gerrianne Scale, MS, OTR/L ascom [phone] 06/07/20, 3:11 PM

## 2020-06-07 NOTE — Discharge Summary (Signed)
Discharge Summary  Mitchell Stewart:[id number] DOB: 09-16-1940  PCP: Lesia Hausen, PA  Admit date: 06/03/2020 Discharge date: 06/07/2020  Time spent: 35 minutes  Recommendations for Outpatient Follow-up:  1. Follow up with neurosurgery 2. Follow up with your PCP 3. Take your medications as prescribed 4. Continue PT OT with assistance and fall precautions   Discharge Diagnoses:  Active Hospital Problems   Diagnosis Date Noted  . Traumatic fracture of cervical spine ([place]) 06/03/2020    Resolved Hospital Problems  No resolved problems to display.    Discharge Condition: Stable   Diet recommendation: Resume previous diet   Vitals:   06/06/20 2341 06/07/20 0742  BP: 140/83 (!) 149/92  Pulse: (!) 105 92  Resp: 16 17  Temp: 99.7 F (37.6 C) 98.9 F (37.2 C)  SpO2: 95% 96%    History of present illness:  [name] 79 y.o.malewith a known history of type 2 diabetes mellitus, dyslipidemia, prostate cancer, dementia and chronic depression, presented to [facility] ED with acute onset of accidental mechanical fall after getting up to go to the bathroom and falling down a flight of 15 stairs landing on his back on concrete with subsequent neck pain as well as bilateral elbow pain. Patient has significant dementia and unable to provide a history or review of systems.  CT cervical spine wo contrast done on 06/03/20 prior to neurosurgery showed:  Acute fracture of the cervical spine predominantly involving the posterior elements of C6 with fracture bilateral pedicles and minimal anterolisthesis of C6 upon C7. The fracture pattern is suggestive of a hyperextension injury. Fractures also are identified involving the spinous process of C6, the left lamina of C6, and the left C7 superior articular facet extending into the left C6-7 facet which demonstrates mild relative widening.  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 Superimposed small epidural hematoma at the  level of the fracture and at C5-6 slightly Superiorly.  CT head showed: No acute intracranial injury.  No calvarial fracture.  Seen by neurosurgery on 06/03/20 and was taken to the OR.  He had the following procedures: Procedure(s): ARTHRODESIS, ANTERIOR INTERBODY, INCLUDING DISC SPACE PREPARATION, DISCECTOMY, OSTEOPHYTECTOMY AND DECOMPRESSION OF SPINAL CORD AND/ORNERVE ROOTS; CERVICAL BELOW C2--C6-7ACDF ARTHRODESIS ANTERIOR INTERBODY W/DISCECTOMY ADDITIONAL FIFTH ANTERIOR INSTRUMENTATION; 2 TO 3 VERTEBRAL SEGMENTS (LIST IN ADDITION TO PRIMARY PROCEDURE) ALLOGRAFT, STRUCTURAL, FOR SPINE SURGERY ONLY (LIST IN ADDITION TO PRIMARY PROCEDURE)   SURGEON: Surgeon(s) and Role: * Malen Gauze, MD - Primary  Christine Zdeb,PA, Asisstant  ANESTHESIA:General   OPERATIVE FINDINGS:Fracture at C6-7     06/07/20: Seen and examined. Alert and pleasantly confused.  No behavioral issues.  No acute events overnight.  No new complaints.    Hospital Course:  Active Problems:   Traumatic fracture of cervical spine (HCC)  Acute C6-C7 traumatic spine fracture secondary to mechanical fall post repair by neurosurgery on 06/03/20. -Continueimmobilizing his C-spine with neck collar -C-spine MRI -- unstable C6-7 fracture, was taken to the OR as stated above. -Pain management with PO pain meds -Neurosurgery consultation with Dr. Deetta Perla -POD #63fixation of C6/7 fracture  -PT OT recommended SNF and 24 hours assistance --[facility] assisting with SNF placement.  Type II diabetesmellitus with hyperglycemia --A1C 7.6 on 06/03/20 -Increase Lantus dose to 7 units twice daily Increase insulin sliding scale to moderate  Dyslipidemia. -continue statin therapy.  Dementia without behavioral disturbance. No behavioral issues since admitted. Continue home Seroquel  Chronic depression Stable Continue home Zoloft  BPH. -continue Flomax. Monitor urine  output  History of prostate cancer Not on home cancer medications   DVT prophylaxis. -Lovenox, subcu daily.  Resolved RUQ pain unclear etiology UA negative for pyuria.   CODE STATUS: Full code    Procedures:  C6-C7 cervical spine fracture repair  Consultations:  Neurosurgery  Discharge Exam: BP (!) 149/92 (BP Location: Left Arm)   Pulse 92   Temp 98.9 F (37.2 C) (Oral)   Resp 17   Ht 5\' 6"  (1.676 m)   Wt 56.7 kg   SpO2 96%   BMI 20.18 kg/m  . General: 79 y.o. year-old male well developed well nourished in no acute distress.  Alert and pleasantly confused.  Neck collar in place. . Cardiovascular: Regular rate and rhythm with no rubs or gallops.  No thyromegaly or JVD noted.   Marland Kitchen Respiratory: Clear to auscultation with no wheezes or rales. Good inspiratory effort. . Abdomen: Soft nontender nondistended with normal bowel sounds x4 quadrants. . Musculoskeletal: No lower extremity edema bilaterally. Marland Kitchen Psychiatry: Mood is appropriate for condition and setting  Discharge Instructions You were cared for by a hospitalist during your hospital stay. If you have any questions about your discharge medications or the care you received while you were in the hospital after you are discharged, you can call the unit and asked to speak with the hospitalist on call if the hospitalist that took care of you is not available. Once you are discharged, your primary care physician will handle any further medical issues. Please note that NO REFILLS for any discharge medications will be authorized once you are discharged, as it is imperative that you return to your primary care physician (or establish a relationship with a primary care physician if you do not have one) for your aftercare needs so that they can reassess your need for medications and monitor your lab values.   Allergies as of 06/07/2020   No Known Allergies     Medication List    TAKE these medications   Basaglar KwikPen  100 UNIT/ML Inject 10 Units into the skin daily.   loratadine 10 MG tablet Commonly known as: CLARITIN Take 10 mg by mouth daily.   polyethylene glycol 17 g packet Commonly known as: MiraLax Take 17 g by mouth daily as needed.   pravastatin 20 MG tablet Commonly known as: PRAVACHOL Take 1 tablet (20 mg total) by mouth daily at 6 PM.   QUEtiapine 25 MG tablet Commonly known as: SEROQUEL Take 25 mg by mouth daily at 4 PM.   sertraline 100 MG tablet Commonly known as: ZOLOFT Take 1.5 tablets by mouth daily.   tamsulosin 0.4 MG Caps capsule Commonly known as: FLOMAX Take 0.4 mg by mouth daily.   traMADol 50 MG tablet Commonly known as: ULTRAM Take 1 tablet (50 mg total) by mouth every 12 (twelve) hours as needed for up to 3 days for severe pain.            Durable Medical Equipment  (From admission, onward)         Start     Ordered   06/06/20 0604  For home use only DME Walker rolling  Once       Question Answer Comment  Walker: With 5 Inch Wheels   Patient needs a walker to treat with the following condition Ambulatory dysfunction      06/06/20 0604         No Known Allergies  Follow-up Information    [place], [place], [place]. Call in 1 day(s).  Specialty: Internal Medicine Why: Please call for a post hospital follow up appointment Contact information: [address] [place] [address] [phone]        Deetta Perla, MD. Call in 1 day(s).   Specialty: Neurosurgery Why: Please call for a post hospital follow up appointment Contact information: [place] [address] [phone]                The results of significant diagnostics from this hospitalization (including imaging, microbiology, ancillary and laboratory) are listed below for reference.    Significant Diagnostic Studies: DG Cervical Spine 2 or 3 views  Result Date: 06/04/2020 CLINICAL DATA:  Status post cervical fusion. EXAM: CERVICAL SPINE - 2-3  VIEW COMPARISON:  June 03, 2020. FINDINGS: Status post surgical anterior fusion of C6-7. Severe degenerative disc disease of C5-6 is noted. IMPRESSION: Status post surgical anterior fusion of C6-7. Electronically Signed   By: Marijo Conception M.D.   On: 06/04/2020 14:29   DG Cervical Spine 2 or 3 views  Result Date: 06/03/2020 CLINICAL DATA:  Cervical fusion EXAM: CERVICAL SPINE - 2-3 VIEW; DG C-ARM 1-60 MIN COMPARISON:  MRI from earlier in the same day. FLUOROSCOPY TIME:  Radiation Exposure Index (as provided by the fluoroscopic device): Not available If the device does not provide the exposure index: Fluoroscopy Time: 6 seconds Number of Acquired Images:  2 FINDINGS: Initial image demonstrates a surgical instrument anterior to the cervical spine at the C6-7 level. Mild anterolisthesis of C6 on C7 is seen. Subsequent interbody fusion and anterior fixation is noted at C6-7. IMPRESSION: Interbody fusion at C6-7 with anterior fixation Electronically Signed   By: Inez Catalina M.D.   On: 06/03/2020 20:21   DG Shoulder Right  Result Date: 06/03/2020 CLINICAL DATA:  Pain status post fall EXAM: RIGHT SHOULDER - 2+ VIEW COMPARISON:  None. FINDINGS: There is no evidence of fracture or dislocation. There is no evidence of arthropathy or other focal bone abnormality. Soft tissues are unremarkable. IMPRESSION: Negative. Electronically Signed   By: Constance Holster M.D.   On: 06/03/2020 02:54   DG Elbow Complete Left  Result Date: 06/03/2020 CLINICAL DATA:  Pain EXAM: LEFT ELBOW - COMPLETE 3+ VIEW COMPARISON:  None. FINDINGS: There is soft tissue swelling about the elbow. Degenerative changes are noted. There is no joint effusion. IMPRESSION: Soft tissue swelling without evidence of an acute displaced fracture. Electronically Signed   By: Constance Holster M.D.   On: 06/03/2020 02:57   DG Elbow Complete Right  Result Date: 06/03/2020 CLINICAL DATA:  Pain EXAM: RIGHT ELBOW - COMPLETE 3+ VIEW COMPARISON:   None. FINDINGS: There is no evidence of fracture, dislocation, or joint effusion. There is no evidence of arthropathy or other focal bone abnormality. Soft tissues are unremarkable. IMPRESSION: Negative. Electronically Signed   By: Constance Holster M.D.   On: 06/03/2020 02:58   DG Abdomen 1 View  Result Date: 06/03/2020 CLINICAL DATA:  Foreign body evaluation for MRI. EXAM: ABDOMEN - 1 VIEW COMPARISON:  None. FINDINGS: No radiopaque foreign body is identified. Gas is present in nondilated loops of small and large bowel without evidence of obstruction. No acute osseous abnormality is seen. IMPRESSION: No radiopaque foreign body identified. Electronically Signed   By: Logan Bores M.D.   On: 06/03/2020 04:33   CT HEAD WO CONTRAST  Result Date: 06/03/2020 CLINICAL DATA:  79 year old male with altered mental status. Status post fall down stairs with acute lower cervical spine  fracture with dorsal hematoma in the spinal canal contributing to moderate spinal stenosis at C6-C7. Multilevel degenerative spinal stenosis. Postoperative day zero ACDF. EXAM: CT HEAD WITHOUT CONTRAST TECHNIQUE: Contiguous axial images were obtained from the base of the skull through the vertex without intravenous contrast. COMPARISON:  Head CT 0150 hours today. FINDINGS: Brain: No midline shift, mass effect, or evidence of intracranial mass lesion. No ventriculomegaly. No acute intracranial hemorrhage identified. Widely scattered and confluent bilateral cerebral white matter hypodensity appears stable from earlier today. No areas of definite cerebral edema. Vascular: Calcified atherosclerosis at the skull base. Intracranial vascular density appears to be stable from earlier today. No changes of acute cortically based infarct are identified. Skull: No skull fracture identified. Sinuses/Orbits: Stable left maxillary mucous retention cyst. Other visualized paranasal sinuses and mastoids are stable and well pneumatized. Other: Stable  orbit and scalp soft tissues. IMPRESSION: Stable non contrast CT appearance of the brain from 0150 hours today. Advanced bilateral white matter disease with no acute intracranial abnormality identified. Electronically Signed   By: Genevie Ann M.D.   On: 06/03/2020 21:56   CT Head Wo Contrast  Result Date: 06/03/2020 CLINICAL DATA:  Fall downstairs, head injury, headache EXAM: CT HEAD WITHOUT CONTRAST CT CERVICAL SPINE WITHOUT CONTRAST TECHNIQUE: Multidetector CT imaging of the head and cervical spine was performed following the standard protocol without intravenous contrast. Multiplanar CT image reconstructions of the cervical spine were also generated. COMPARISON:  None. FINDINGS: CT HEAD FINDINGS Brain: Normal anatomic configuration of the brain. Mild parenchymal volume loss is commensurate with the patient's age. There are moderate to severe periventricular and subcortical white matter changes present likely reflecting the sequela of small vessel ischemia. 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 No acute intracranial hemorrhage or infarct. No abnormal mass effect or midline shift. No abnormal intra or extra-axial mass lesion or fluid collection. Ventricular size is normal. Cerebellum is unremarkable. Vascular: No asymmetric hyperdense vasculature at the skull base. Skull: Intact Sinuses/Orbits: The paranasal sinuses are clear. The orbits are unremarkable. Other: Mastoid air cells and middle ear cavities are clear. CT CERVICAL SPINE FINDINGS Alignment: There is 2 mm anterolisthesis of C6 upon C7, likely posttraumatic in nature. Minimal anterolisthesis of C4 upon C5 is likely physiologic. Skull base and vertebrae: The craniocervical junction is unremarkable. Atlantal dental interval is normal. There are acute fractures involving the left lamina and spinous process of C6. There is a fracture involving the left pedicle of C6 as well as the the superior articular facet of C7 extending into the  left C6-7 facet with mild joint space widening noted. Finally, a fracture extends through the pedicle of C6 on the right. Soft tissues and spinal canal: A small epidural hematoma measuring 3 mm x 10 mm is seen superficial to the C6 laminar fracture demonstrating minimal effacement of the canal a smaller epidural hematoma is seen superiorly posterior to the thecal sac at C5-6. No paraspinal fluid collection is identified. There is subcutaneous infiltration within the posterior soft tissues superficial to the spinous processes likely representing edema. Disc levels: Review of the sagittal reformats demonstrates preservation of vertebral body height. There is intervertebral disc space narrowing and endplate remodeling at K0-U5 in keeping with changes of moderate degenerative disc disease. 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 Review of the axial images confirms the above-mentioned fractures. There is multilevel uncovertebral and facet arthrosis resulting in multilevel mild neural foraminal narrowing. 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 Upper chest:  Unremarkable Other: None significant IMPRESSION: No acute intracranial injury.  No calvarial fracture. Acute fracture of the cervical spine predominantly involving the posterior elements of C6 with fracture bilateral pedicles and minimal anterolisthesis of C6 upon C7. The fracture pattern is suggestive of a hyperextension injury. Fractures also are identified involving the spinous process of C6, the left lamina of C6, and the left C7 superior articular facet extending into the left C6-7 facet which demonstrates mild relative widening. 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 Superimposed small epidural hematoma at the level of the fracture and at C5-6 slightly superiorly. These results will be called to the ordering clinician or representative by the Radiologist Assistant, and communication documented in the PACS or [employer]. Electronically Signed   By: Fidela Salisbury MD   On: 06/03/2020 02:26   CT CERVICAL SPINE WO CONTRAST  Result Date: 06/03/2020 CLINICAL DATA:  78 year old male with altered mental status. Status post fall down stairs with acute lower cervical spine fracture with dorsal hematoma in the spinal canal contributing to moderate spinal stenosis at C6-C7. Multilevel degenerative spinal stenosis. Postoperative day zero ACDF. EXAM: CT CERVICAL SPINE WITHOUT CONTRAST TECHNIQUE: Multidetector CT imaging of the cervical spine was performed without intravenous contrast. Multiplanar CT image reconstructions were also generated. COMPARISON:  Cervical spine CT 0150 hours today. Preoperative cervical spine MRI 0503 hours today. FINDINGS: Alignment: Mildly improved cervical lordosis from earlier today. Subtle anterolisthesis of C4 on C5 is stable and appears to be degenerative in nature. Mild posttraumatic anterolisthesis of C6 on C7 appears stable. See additional details below. Skull base and vertebrae: Bilateral C6 pedicle fractures remain nondisplaced. There is less distraction of the C6 lamina fractures now. A nondisplaced fracture through the left C7 superior articulating facet and lamina is stable (sagittal image 41 now). Postoperative details are below. No new osseous abnormality identified. Stable subchondral cyst at the left odontoid. Visualized skull base is intact. No atlanto-occipital dissociation. Soft tissues and spinal canal: Ventral and posterior increased intraspinal density (sagittal image 30 with soft tissue windows) appear stable from the CT earlier this morning and more resembles ligamentous hypertrophy than intraspinal hemorrhage on the MRI. Small volume of prevertebral and small to moderate volume left lateral neck soft tissue gas is related to interval ACDF. No postoperative fluid collection is evident. Disc levels: Stable CT appearance of the level C2-C3 through C4-C5. Including moderate to  severe multifactorial spinal stenosis at C3-C4 (series 3, image 38). C5-C6: Stable disc space loss, circumferential disc osteophyte complex. Mild spinal stenosis, severe left and moderate right C6 foraminal stenosis at this level appears stable from the preoperative MRI. C6-C7: Interval ACDF. Hardware appears intact with no adverse features. Small volume of posterior dural or epidural hemorrhage here appears not significantly changed (series 3, image 56), about 2 mm in thickness. No CT evidence of increased spinal stenosis. And C7 foraminal patency especially on the left appears improved. C7-T1:  Stable, relatively negative. Upper chest: Visible upper thoracic levels remain grossly intact. Mild apical lung scarring on the left is stable. IMPRESSION: 1. Interval ACDF with no adverse hardware features. Bilateral C6 posterior element and left C7 superior articulating facet fractures now are largely nondisplaced. A small volume of dorsal intraspinal hemorrhage eccentric to the left appears stable. No increased spinal stenosis here evident by CT, and C7 foraminal patency appears improved especially on the left. 2. Degenerative lumbar spinal stenosis elsewhere appears stable, and is moderate to severe at C3-C4 as previously noted. 3. No new osseous abnormality  identified. Small volume postoperative gas in the neck. Electronically Signed   By: Genevie Ann M.D.   On: 06/03/2020 22:11   CT Cervical Spine Wo Contrast  Result Date: 06/03/2020 CLINICAL DATA:  Fall downstairs, head injury, headache EXAM: CT HEAD WITHOUT CONTRAST CT CERVICAL SPINE WITHOUT CONTRAST TECHNIQUE: Multidetector CT imaging of the head and cervical spine was performed following the standard protocol without intravenous contrast. Multiplanar CT image reconstructions of the cervical spine were also generated. COMPARISON:  None. FINDINGS: CT HEAD FINDINGS Brain: Normal anatomic configuration of the brain. Mild parenchymal volume loss is commensurate with  the patient's age. There are moderate to severe periventricular and subcortical white matter changes present likely reflecting the sequela of small vessel ischemia. 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 No acute intracranial hemorrhage or infarct. No abnormal mass effect or midline shift. No abnormal intra or extra-axial mass lesion or fluid collection. Ventricular size is normal. Cerebellum is unremarkable. Vascular: No asymmetric hyperdense vasculature at the skull base. Skull: Intact Sinuses/Orbits: The paranasal sinuses are clear. The orbits are unremarkable. Other: Mastoid air cells and middle ear cavities are clear. CT CERVICAL SPINE FINDINGS Alignment: There is 2 mm anterolisthesis of C6 upon C7, likely posttraumatic in nature. Minimal anterolisthesis of C4 upon C5 is likely physiologic. Skull base and vertebrae: The craniocervical junction is unremarkable. Atlantal dental interval is normal. There are acute fractures involving the left lamina and spinous process of C6. There is a fracture involving the left pedicle of C6 as well as the the superior articular facet of C7 extending into the left C6-7 facet with mild joint space widening noted. Finally, a fracture extends through the pedicle of C6 on the right. Soft tissues and spinal canal: A small epidural hematoma measuring 3 mm x 10 mm is seen superficial to the C6 laminar fracture demonstrating minimal effacement of the canal a smaller epidural hematoma is seen superiorly posterior to the thecal sac at C5-6. No paraspinal fluid collection is identified. There is subcutaneous infiltration within the posterior soft tissues superficial to the spinous processes likely representing edema. Disc levels: Review of the sagittal reformats demonstrates preservation of vertebral body height. There is intervertebral disc space narrowing and endplate remodeling at R4-W5 in keeping with changes of moderate degenerative disc disease. The spinal canal  is diffusely congenitally narrowed. Review of the axial images confirms the above-mentioned fractures. There is multilevel uncovertebral and facet arthrosis resulting in multilevel mild neural foraminal narrowing. 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 Upper chest: Unremarkable Other: None significant IMPRESSION: No acute intracranial injury.  No calvarial fracture. Acute fracture of the cervical spine predominantly involving the posterior elements of C6 with fracture bilateral pedicles and minimal anterolisthesis of C6 upon C7. The fracture pattern is suggestive of a hyperextension injury. Fractures also are identified involving the spinous process of C6, the left lamina of C6, and the left C7 superior articular facet extending into the left C6-7 facet which demonstrates mild relative widening. 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 Superimposed small epidural hematoma at the level of the fracture and at C5-6 slightly superiorly. These results will be called to the ordering clinician or representative by the Radiologist Assistant, and communication documented in the PACS or [employer]. Electronically Signed   By: Fidela Salisbury MD   On: 06/03/2020 02:26   MR Cervical Spine Wo Contrast  Result Date: 06/03/2020 CLINICAL DATA:  Fall with cervical spine fracture. EXAM: MRI CERVICAL SPINE WITHOUT CONTRAST  TECHNIQUE: Multiplanar, multisequence MR imaging of the cervical spine was performed. No intravenous contrast was administered. COMPARISON:  Cervical spine CT 06/03/2020 FINDINGS: Image quality is degraded by motion artifact and increased image noise due to the presence of a cervical spine collar limiting coil positioning. Alignment: Unchanged trace anterolisthesis of C6 on C7. Vertebrae: Mild posterior element edema at C6-7 related to underlying fractures, better demonstrated on CT. Chronic degenerative endplate changes at B2-8 and C6-7. Cord: No convincing spinal cord  signal abnormality is identified within study limitations. A small dorsal epidural hematoma at C6-7 measures up to 3 mm in thickness. Posterior Fossa, vertebral arteries, paraspinal tissues: Minimal prevertebral edema. Mild right and moderate left-sided posterior paraspinal soft tissue edema in the mid and lower cervical spine including in the inter spinous soft tissues at C5-6 and C6-7. Disc levels: The cervical spinal canal is diffusely narrow on a congenital basis. Detailed assessment of disc and facet degeneration is limited by motion, however there is severe multifactorial spinal stenosis at C3-4 greater than C4-5 with moderate cord flattening. Multifactorial spinal stenosis at C5-6 is moderate, and there is also moderate spinal stenosis at C6-7 in part secondary to the epidural hematoma. IMPRESSION: 1. Motion degraded examination. 2. Known posterior element fractures at C6-7 with associated mild marrow edema and moderate paraspinal soft tissue/posterior ligamentous complex edema. 3. Small dorsal epidural hematoma at C6-7 contributing to moderate spinal stenosis. 4. No convincing spinal cord signal abnormality. 5. 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 Electronically Signed   By: Logan Bores M.D.   On: 06/03/2020 06:12   DG Chest Portable 1 View  Result Date: 06/03/2020 CLINICAL DATA:  Foreign body evaluation for MRI. EXAM: PORTABLE CHEST 1 VIEW COMPARISON:  None. FINDINGS: The cardiomediastinal silhouette is within normal limits. The patient's chin projects over the right lung apex. There is slight prominence of the interstitial markings without overt pulmonary edema. No confluent airspace opacity, sizeable pleural effusion, or pneumothorax is identified. No acute osseous abnormality is seen. IMPRESSION: No radiopaque foreign body identified. Electronically Signed   By: Logan Bores M.D.   On: 06/03/2020 04:35   DG Shoulder Left  Result Date: 06/03/2020 CLINICAL DATA:  Pain  EXAM: LEFT SHOULDER - 2+ VIEW COMPARISON:  None. FINDINGS: There is no evidence of fracture or dislocation. There is no evidence of arthropathy or other focal bone abnormality. Soft tissues are unremarkable. IMPRESSION: Negative. Electronically Signed   By: Constance Holster M.D.   On: 06/03/2020 02:55   DG C-Arm 1-60 Min  Result Date: 06/03/2020 CLINICAL DATA:  Cervical fusion EXAM: CERVICAL SPINE - 2-3 VIEW; DG C-ARM 1-60 MIN COMPARISON:  MRI from earlier in the same day. FLUOROSCOPY TIME:  Radiation Exposure Index (as provided by the fluoroscopic device): Not available If the device does not provide the exposure index: Fluoroscopy Time: 6 seconds Number of Acquired Images:  2 FINDINGS: Initial image demonstrates a surgical instrument anterior to the cervical spine at the C6-7 level. Mild anterolisthesis of C6 on C7 is seen. Subsequent interbody fusion and anterior fixation is noted at C6-7. IMPRESSION: Interbody fusion at C6-7 with anterior fixation Electronically Signed   By: Inez Catalina M.D.   On: 06/03/2020 20:21    Microbiology: Recent Results (from the past 240 hour(s))  Respiratory Panel by RT PCR (Flu A&B, Covid) - Nasopharyngeal Swab     Status: None   Collection Time: 06/03/20  3:20 AM   Specimen: Nasopharyngeal Swab  Result Value Ref Range Status  SARS Coronavirus 2 by RT PCR NEGATIVE NEGATIVE Final    Comment: (NOTE) SARS-CoV-2 target nucleic acids are NOT DETECTED.  The SARS-CoV-2 RNA is generally detectable in upper respiratoy specimens during the acute phase of infection. The lowest concentration of SARS-CoV-2 viral copies this assay can detect is 131 copies/mL. A negative result does not preclude SARS-Cov-2 infection and should not be used as the sole basis for treatment or other patient management decisions. A negative result may occur with  improper specimen collection/handling, submission of specimen other than nasopharyngeal swab, presence of viral mutation(s) within  the areas targeted by this assay, and inadequate number of viral copies (<131 copies/mL). A negative result must be combined with clinical observations, patient history, and epidemiological information. The expected result is Negative.  Fact Sheet for Patients:  PinkCheek.be  Fact Sheet for Healthcare Providers:  GravelBags.it  This test is no t yet approved or cleared by the [place] FDA and  has been authorized for detection and/or diagnosis of SARS-CoV-2 by FDA under an Emergency Use Authorization (EUA). This EUA will remain  in effect (meaning this test can be used) for the duration of the COVID-19 declaration under Section 564(b)(1) of the Act, 21 U.S.C. section 360bbb-3(b)(1), unless the authorization is terminated or revoked sooner.     Influenza A by PCR NEGATIVE NEGATIVE Final   Influenza B by PCR NEGATIVE NEGATIVE Final    Comment: (NOTE) The Xpert Xpress SARS-CoV-2/FLU/RSV assay is intended as an aid in  the diagnosis of influenza from Nasopharyngeal swab specimens and  should not be used as a sole basis for treatment. Nasal washings and  aspirates are unacceptable for Xpert Xpress SARS-CoV-2/FLU/RSV  testing.  Fact Sheet for Patients: PinkCheek.be  Fact Sheet for Healthcare Providers: GravelBags.it  This test is not yet approved or cleared by the [place] FDA and  has been authorized for detection and/or diagnosis of SARS-CoV-2 by  FDA under an Emergency Use Authorization (EUA). This EUA will remain  in effect (meaning this test can be used) for the duration of the  Covid-19 declaration under Section 564(b)(1) of the Act, 21  U.S.C. section 360bbb-3(b)(1), unless the authorization is  terminated or revoked. Performed at [facility], [address] [place], [place] [address]      Labs: Basic Metabolic Panel: Recent Labs    Lab 06/03/20 0320 06/03/20 2227 06/05/20 1519 06/06/20 0426 06/06/20 1245  NA 139 138 139 144  --   K 3.4* 3.8 3.8 4.0  --   CL 105 105 104 104  --   CO2 20* 19* 23 27  --   GLUCOSE 148* 151* 219* 152* 394*  BUN 27* 15 23 23   --   CREATININE 1.17 0.85 0.92 0.96  --   CALCIUM 9.0 8.2* 8.7* 8.7*  --    Liver Function Tests: Recent Labs  Lab 06/05/20 1519  AST 26  ALT 18  ALKPHOS 62  BILITOT 1.3*  PROT 6.7  ALBUMIN 3.6   No results for input(s): LIPASE, AMYLASE in the last 168 hours. No results for input(s): AMMONIA in the last 168 hours. CBC: Recent Labs  Lab 06/03/20 0320 06/03/20 2227 06/05/20 1519 06/06/20 0425  WBC 12.1* 11.1* 16.6* 13.0*  NEUTROABS 10.4*  --  15.3*  --   HGB 14.7 14.1 14.5 14.1  HCT 43.9 42.2 42.1 42.4  MCV 87.8 88.1 85.1 87.6  PLT 138* 158 166 169   Cardiac Enzymes: No results for input(s): CKTOTAL, CKMB, CKMBINDEX, TROPONINI  in the last 168 hours. BNP: BNP (last 3 results) No results for input(s): BNP in the last 8760 hours.  ProBNP (last 3 results) No results for input(s): PROBNP in the last 8760 hours.  CBG: Recent Labs  Lab 06/06/20 1201 06/06/20 1647 06/06/20 2016 06/07/20 0745 06/07/20 1138  GLUCAP 420* 185* 234* 199* 198*       Signed:  Kayleen Memos, MD [facility] 06/07/2020, 2:20 PM

## 2020-06-08 DIAGNOSIS — S129XXA Fracture of neck, unspecified, initial encounter: Secondary | ICD-10-CM | POA: Diagnosis not present

## 2020-06-08 LAB — GLUCOSE, CAPILLARY
Glucose-Capillary: 100 mg/dL — ABNORMAL HIGH (ref 70–99)
Glucose-Capillary: 119 mg/dL — ABNORMAL HIGH (ref 70–99)
Glucose-Capillary: 152 mg/dL — ABNORMAL HIGH (ref 70–99)
Glucose-Capillary: 165 mg/dL — ABNORMAL HIGH (ref 70–99)
Glucose-Capillary: 258 mg/dL — ABNORMAL HIGH (ref 70–99)

## 2020-06-08 NOTE — Plan of Care (Signed)
  Problem: Clinical Measurements: Goal: Ability to maintain clinical measurements within normal limits will improve Outcome: Progressing Goal: Will remain free from infection Outcome: Progressing Goal: Diagnostic test results will improve Outcome: Progressing Goal: Respiratory complications will improve Outcome: Progressing Goal: Cardiovascular complication will be avoided Outcome: Progressing   Problem: Activity: Goal: Risk for activity intolerance will decrease Outcome: Progressing   Problem: Nutrition: Goal: Adequate nutrition will be maintained Outcome: Progressing   Problem: Coping: Goal: Level of anxiety will decrease Outcome: Progressing   Problem: Elimination: Goal: Will not experience complications related to bowel motility Outcome: Progressing Goal: Will not experience complications related to urinary retention Outcome: Progressing   Problem: Pain Managment: Goal: General experience of comfort will improve Outcome: Progressing   Problem: Safety: Goal: Ability to remain free from injury will improve Outcome: Progressing   Problem: Skin Integrity: Goal: Risk for impaired skin integrity will decrease Outcome: Progressing   Problem: Education: Goal: Ability to verbalize activity precautions or restrictions will improve Outcome: Progressing Goal: Knowledge of the prescribed therapeutic regimen will improve Outcome: Progressing Goal: Understanding of discharge needs will improve Outcome: Progressing   Problem: Activity: Goal: Ability to avoid complications of mobility impairment will improve Outcome: Progressing Goal: Ability to tolerate increased activity will improve Outcome: Progressing Goal: Will remain free from falls Outcome: Progressing   Problem: Bowel/Gastric: Goal: Gastrointestinal status for postoperative course will improve Outcome: Progressing   Problem: Clinical Measurements: Goal: Ability to maintain clinical measurements within normal  limits will improve Outcome: Progressing Goal: Postoperative complications will be avoided or minimized Outcome: Progressing Goal: Diagnostic test results will improve Outcome: Progressing   Problem: Pain Management: Goal: Pain level will decrease Outcome: Progressing   Problem: Skin Integrity: Goal: Will show signs of wound healing Outcome: Progressing   Problem: Health Behavior/Discharge Planning: Goal: Identification of resources available to assist in meeting health care needs will improve Outcome: Progressing   Problem: Bladder/Genitourinary: Goal: Urinary functional status for postoperative course will improve Outcome: Progressing

## 2020-06-08 NOTE — Progress Notes (Signed)
Physical Therapy Treatment Patient Details Name: Mitchell Stewart MRN: [id number] DOB: [birth year] Today's Date: 06/08/2020    History of Present Illness Pt is a 79 y/o M with PMH: dementia, DM, HOH, HLD and prostate cancer who presented to [facility] ED s/p fall down flight of stairs. Pt found to have cervical spine fx and is now s/p C6-7 ACDF (06/03/2020).    PT Comments    Pt was long sitting in bed with cervical collar donned. Continues to be only oriented to self however is conversational. Pleasantly confused and requires increased time and max vcs/tcs throughout for safety.He required max assist to log roll R to short sit EOB. Does have much improved sitting balance while seated EOB. Supervision to maintain balance with all extremity support. Pt has hd inconsistent sessions throughout hospitalization. Mod assist to stand to RW with max vcs. Stood ~ 5 minutes throughout session. Mod assist in standing to safely take steps (~5) to recliner. Pt have poor motor planning and very narrow BOS. Requires tactile cues for LE progression. At conclusion of session, pt was sitting in recliner with chair alarm in place, tray table placed in front of him, and call bell in reach. Acute PT will continue to follow and progress as able per POC.    Follow Up Recommendations  SNF     Equipment Recommendations  Rolling walker with 5" wheels    Recommendations for Other Services       Precautions / Restrictions Precautions Precautions: Fall;Cervical Precaution Comments: Brace on at all times (can be applied seated EOB per order) Required Braces or Orthoses: Cervical Brace Cervical Brace: Hard collar ([place] ) Restrictions Weight Bearing Restrictions: No    Mobility  Bed Mobility Overal bed mobility: Needs Assistance Bed Mobility: Rolling;Supine to Sit;Sit to Supine;Sidelying to Sit;Sit to Sidelying Rolling: Max assist Sidelying to sit: Max assist Supine to sit: Max assist Sit to supine: Max assist Sit  to sidelying: Max assist General bed mobility comments: pt was able to log roll R to short sit with increased time and max assist. tc/vcs throughout for technique, sequencing, safety  Transfers Overall transfer level: Needs assistance Equipment used: Rolling walker (2 wheeled) Transfers: Sit to/from Stand Sit to Stand: Mod assist;From elevated surface         General transfer comment: pt was able to stand with less assistance today. continues to require max vcs and tcs throughout. Cognition continues to be most limiting factor. does demonstrate good strength but poor motor planning.  Ambulation/Gait Ambulation/Gait assistance: Mod assist Gait Distance (Feet): 5 Feet Assistive device: Rolling walker (2 wheeled) Gait Pattern/deviations: Narrow base of support;Staggering left;Staggering right Gait velocity: decreased   General Gait Details: pt was able to lift R/L LE to take steps however needs constant cueing for progression and motor planning. transfer to chair took ~ 5 minutes in standing        Balance Overall balance assessment: Needs assistance Sitting-balance support: Feet supported;Bilateral upper extremity supported Sitting balance-Leahy Scale: Fair Sitting balance - Comments: no LOB in sitting. pt was able to maintain sitting at EOB with supervision only this date. much improved from previous date.   Standing balance support: Bilateral upper extremity supported Standing balance-Leahy Scale: Poor Standing balance comment: Reliant on BUE support + mod assist          Cognition Arousal/Alertness: Awake/alert Behavior During Therapy: WFL for tasks assessed/performed Overall Cognitive Status: History of cognitive impairments - at baseline      General Comments: Pt is oriented  to self only. Needs alot of extra time with all commands ands and desired task             Pertinent Vitals/Pain Pain Assessment: No/denies pain Faces Pain Scale: No hurt Pain Location: L  shoulder with mobility Pain Descriptors / Indicators: Grimacing;Guarding Pain Intervention(s): Limited activity within patient's tolerance;Monitored during session;Premedicated before session;Repositioned           PT Goals (current goals can now be found in the care plan section) Acute Rehab PT Goals Patient Stated Goal: none stated Progress towards PT goals: Progressing toward goals (cognition slows progress)    Frequency    BID      PT Plan Current plan remains appropriate       AM-PAC PT "6 Clicks" Mobility   Outcome Measure  Help needed turning from your back to your side while in a flat bed without using bedrails?: A Lot Help needed moving from lying on your back to sitting on the side of a flat bed without using bedrails?: A Lot Help needed moving to and from a bed to a chair (including a wheelchair)?: A Lot Help needed standing up from a chair using your arms (e.g., wheelchair or bedside chair)?: A Lot Help needed to walk in hospital room?: A Lot Help needed climbing 3-5 steps with a railing? : Total 6 Click Score: 11    End of Session Equipment Utilized During Treatment: Gait belt (heavy use) Activity Tolerance: Patient tolerated treatment well Patient left: in chair;with call bell/phone within reach;with chair alarm set Nurse Communication: Mobility status PT Visit Diagnosis: Muscle weakness (generalized) (M62.81);Unsteadiness on feet (R26.81);History of falling (Z91.81)     Time: 7564-3329 PT Time Calculation (min) (ACUTE ONLY): 24 min  Charges:  $Therapeutic Activity: 23-37 mins                     Julaine Fusi PTA 06/08/20, 12:28 PM

## 2020-06-08 NOTE — Plan of Care (Signed)

## 2020-06-08 NOTE — Progress Notes (Signed)
Physical Therapy Treatment Patient Details Name: Mitchell Stewart MRN: [id number] DOB: June 13, 1941 Today's Date: 06/08/2020    History of Present Illness Pt is a 79 y/o M with PMH: dementia, DM, HOH, HLD and prostate cancer who presented to [facility] ED s/p fall down flight of stairs. Pt found to have cervical spine fx and is now s/p C6-7 ACDF (06/03/2020).    PT Comments    Pt was still sitting up in recliner from AM session upon arriving. He is more fatigued this afternoon and did request to get back into bed. Continues to be disoriented and pleasantly confused but able to follow simple one step commands with increased time + tc/vcs. Stood with BUE supported by therapist to step/pivot back to EOB. Max assist to progress from short sit to supine. Once in bed supine, was able to urinate 300 mls in urinal. This session was limited by cognition/fatigue but overall pt is progressing towards goals. Much improved functional mobility/abilities today versus previous date. Acute PT will continue to follow. Will benefit from SNF at [place] to address deficits and improve independence.     Follow Up Recommendations  SNF     Equipment Recommendations  Other (comment) (defer to next level of care)    Recommendations for Other Services       Precautions / Restrictions Precautions Precautions: Fall;Cervical Precaution Comments: Brace on at all times (can be applied seated EOB per order) Required Braces or Orthoses: Cervical Brace Cervical Brace: Hard collar Restrictions Weight Bearing Restrictions: No    Mobility  Bed Mobility Overal bed mobility: Needs Assistance Bed Mobility: Sit to Supine Rolling: Max assist Sidelying to sit: Max assist Supine to sit: Max assist Sit to supine: Max assist Sit to sidelying: Max assist General bed mobility comments: pt required max assist to progress BLEs back into bed from EOB short sit. Once returned to supine, requested to go pee and was able to urinate 300 MLs in  urinal.   Transfers Overall transfer level: Needs assistance Equipment used: None Transfers: Stand Pivot Transfers Sit to Stand: Mod assist;Max assist (from recliner) Stand pivot transfers: Max assist       General transfer comment: max assist to stand pivot/step from recliner to EOB. pt limited by fatigue this PM.  Ambulation/Gait Ambulation/Gait assistance: Max assist Gait Distance (Feet): 3 Feet Assistive device: None Gait Pattern/deviations: Narrow base of support Gait velocity: decreased   General Gait Details: was able to progress Les during pivot/stepping back to EOB from recliner. needs asssistance with lateral wt shift for Le progression.       Balance Overall balance assessment: Needs assistance Sitting-balance support: Feet supported;Bilateral upper extremity supported Sitting balance-Leahy Scale: Fair Sitting balance - Comments: no LOB in sitting. pt was able to maintain sitting at EOB with supervision only this date. much improved from previous date.   Standing balance support: Bilateral upper extremity supported;During functional activity Standing balance-Leahy Scale: Poor Standing balance comment: Reliant on BUE support + mod assist          Cognition Arousal/Alertness: Lethargic Behavior During Therapy: WFL for tasks assessed/performed Overall Cognitive Status: History of cognitive impairments - at baseline      General Comments: continues to only be oriented to self. requested to get back in bed 2/2 to " I'm wore out."             Pertinent Vitals/Pain Pain Assessment: No/denies pain Faces Pain Scale: Hurts a little bit Pain Location: L rib/side  Pain Descriptors / Indicators:  Grimacing;Guarding Pain Intervention(s): Limited activity within patient's tolerance;Monitored during session;Premedicated before session;Repositioned           PT Goals (current goals can now be found in the care plan section) Acute Rehab PT Goals Patient Stated  Goal: none stated Progress towards PT goals: Progressing toward goals    Frequency    BID      PT Plan Current plan remains appropriate       AM-PAC PT "6 Clicks" Mobility   Outcome Measure  Help needed turning from your back to your side while in a flat bed without using bedrails?: A Lot Help needed moving from lying on your back to sitting on the side of a flat bed without using bedrails?: A Lot Help needed moving to and from a bed to a chair (including a wheelchair)?: A Lot Help needed standing up from a chair using your arms (e.g., wheelchair or bedside chair)?: A Lot Help needed to walk in hospital room?: A Lot Help needed climbing 3-5 steps with a railing? : Total 6 Click Score: 11    End of Session Equipment Utilized During Treatment: Gait belt Activity Tolerance: Patient tolerated treatment well Patient left: in bed;with call bell/phone within reach;with bed alarm set;with family/visitor present Nurse Communication: Mobility status PT Visit Diagnosis: Muscle weakness (generalized) (M62.81);Unsteadiness on feet (R26.81);History of falling (Z91.81)     Time: 1855-0158 PT Time Calculation (min) (ACUTE ONLY): 13 min  Charges:  $Therapeutic Activity: 8-22 mins                     Julaine Fusi PTA 06/08/20, 3:10 PM

## 2020-06-08 NOTE — Progress Notes (Addendum)
Discharge Summary  Mitchell Stewart:[id number] DOB: November 01, 1940  PCP: Lesia Hausen, PA  Admit date: 06/03/2020 Discharge date: 06/08/2020  Time spent: 35 minutes  Recommendations for Outpatient Follow-up:  1. Follow up with neurosurgery 2. Follow up with your PCP 3. Take your medications as prescribed 4. Continue PT OT with assistance and fall precautions   Discharge Diagnoses:  Active Hospital Problems   Diagnosis Date Noted  . Traumatic fracture of cervical spine ([place]) 06/03/2020    Resolved Hospital Problems  No resolved problems to display.    Discharge Condition: Stable   Diet recommendation: Resume previous diet   Vitals:   06/07/20 2337 06/08/20 0743  BP: 131/86 (!) 145/88  Pulse: 93 81  Resp: 17 17  Temp: 98.2 F (36.8 C) 99 F (37.2 C)  SpO2: 97% 99%    History of present illness:  Mitchell Stewart Mitchell Stewart 79 y.o.malewith a known history of type 2 diabetes mellitus, dyslipidemia, prostate cancer, dementia and chronic depression, presented to [facility] ED with acute onset of accidental mechanical fall after getting up to go to the bathroom and falling down a flight of 15 stairs landing on his back on concrete with subsequent neck pain as well as bilateral elbow pain. Patient has significant dementia and unable to provide a history or review of systems.  CT cervical spine wo contrast done on 06/03/20 prior to neurosurgery showed:  Acute fracture of the cervical spine predominantly involving the posterior elements of C6 with fracture bilateral pedicles and minimal anterolisthesis of C6 upon C7. The fracture pattern is suggestive of a hyperextension injury. Fractures also are identified involving the spinous process of C6, the left lamina of C6, and the left C7 superior articular facet extending into the left C6-7 facet which demonstrates mild relative widening.  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 Superimposed small epidural hematoma at the level of  the fracture and at C5-6 slightly Superiorly.  CT head showed: No acute intracranial injury.  No calvarial fracture.  Seen by neurosurgery on 06/03/20 and was taken to the OR.  He had the following procedures: Procedure(s): ARTHRODESIS, ANTERIOR INTERBODY, INCLUDING DISC SPACE PREPARATION, DISCECTOMY, OSTEOPHYTECTOMY AND DECOMPRESSION OF SPINAL CORD AND/ORNERVE ROOTS; CERVICAL BELOW C2--C6-7ACDF ARTHRODESIS ANTERIOR INTERBODY W/DISCECTOMY ADDITIONAL FIFTH ANTERIOR INSTRUMENTATION; 2 TO 3 VERTEBRAL SEGMENTS (LIST IN ADDITION TO PRIMARY PROCEDURE) ALLOGRAFT, STRUCTURAL, FOR SPINE SURGERY ONLY (LIST IN ADDITION TO PRIMARY PROCEDURE)   SURGEON: Surgeon(s) and Role: * Malen Gauze, MD - Primary  Christine Zdeb,PA, Asisstant  ANESTHESIA:General   OPERATIVE FINDINGS:Fracture at C6-7     06/08/20: Seen and examined. Alert and pleasantly confused.  No behavioral issues.  No acute events overnight.  No new complaints.  Awaiting bed placement.    Hospital Course:  Active Problems:   Traumatic fracture of cervical spine (HCC)  Acute C6-C7 traumatic spine fracture secondary to mechanical fall post repair by neurosurgery on 06/03/20. -Continueimmobilizing his C-spine with neck collar -C-spine MRI -- unstable C6-7 fracture, was taken to the OR as stated above. -Pain management with PO pain meds -Neurosurgery consultation with Dr. Deetta Perla -POD #29fixation of C6/7 fracture  -PT OT recommended SNF and 24 hours assistance --[facility] assisting with SNF placement.  Type II diabetesmellitus with hyperglycemia --A1C 7.6 on 06/03/20 -Increase Lantus dose to 7 units twice daily Increase insulin sliding scale to moderate  Dyslipidemia. -continue statin therapy.  Dementia without behavioral disturbance. No behavioral issues since admitted. Continue home Seroquel  Chronic depression Stable Continue home Zoloft  BPH. -continue Flomax. Monitor  urine output  History of prostate cancer Not on home cancer medications   DVT prophylaxis. -Lovenox, subcu daily.  Resolved RUQ pain unclear etiology UA negative for pyuria.   CODE STATUS: Full code    Procedures:  C6-C7 cervical spine fracture repair  Consultations:  Neurosurgery  Discharge Exam: No acute changes from prior exam. BP (!) 145/88 (BP Location: Left Arm)   Pulse 81   Temp 99 F (37.2 C)   Resp 17   Ht 5\' 6"  (1.676 m)   Wt 56.7 kg   SpO2 99%   BMI 20.18 kg/m  . General: 79 y.o. year-old male well developed well nourished in no acute distress.  Alert and pleasantly confused.  Neck collar in place. . Cardiovascular: Regular rate and rhythm with no rubs or gallops.  No thyromegaly or JVD noted.   Marland Kitchen Respiratory: Clear to auscultation with no wheezes or rales. Good inspiratory effort. . Abdomen: Soft nontender nondistended with normal bowel sounds x4 quadrants. . Musculoskeletal: No lower extremity edema bilaterally. Marland Kitchen Psychiatry: Mood is appropriate for condition and setting  Discharge Instructions You were cared for by a hospitalist during your hospital stay. If you have any questions about your discharge medications or the care you received while you were in the hospital after you are discharged, you can call the unit and asked to speak with the hospitalist on call if the hospitalist that took care of you is not available. Once you are discharged, your primary care physician will handle any further medical issues. Please note that NO REFILLS for any discharge medications will be authorized once you are discharged, as it is imperative that you return to your primary care physician (or establish a relationship with a primary care physician if you do not have one) for your aftercare needs so that they can reassess your need for medications and monitor your lab values.   Allergies as of 06/08/2020   No Known Allergies     Medication List    TAKE these  medications   Basaglar KwikPen 100 UNIT/ML Inject 10 Units into the skin daily.   loratadine 10 MG tablet Commonly known as: CLARITIN Take 10 mg by mouth daily.   polyethylene glycol 17 g packet Commonly known as: MiraLax Take 17 g by mouth daily as needed.   pravastatin 20 MG tablet Commonly known as: PRAVACHOL Take 1 tablet (20 mg total) by mouth daily at 6 PM.   QUEtiapine 25 MG tablet Commonly known as: SEROQUEL Take 25 mg by mouth daily at 4 PM.   sertraline 100 MG tablet Commonly known as: ZOLOFT Take 1.5 tablets by mouth daily.   tamsulosin 0.4 MG Caps capsule Commonly known as: FLOMAX Take 0.4 mg by mouth daily.   traMADol 50 MG tablet Commonly known as: ULTRAM Take 1 tablet (50 mg total) by mouth every 12 (twelve) hours as needed for up to 3 days for severe pain.            Durable Medical Equipment  (From admission, onward)         Start     Ordered   06/06/20 0604  For home use only DME Walker rolling  Once       Question Answer Comment  Walker: With 5 Inch Wheels   Patient needs a walker to treat with the following condition Ambulatory dysfunction      06/06/20 0604         No Known Allergies  Follow-up Information  Lesia Hausen, [place]. Call in 1 day(s).   Specialty: Internal Medicine Why: Please call for a post hospital follow up appointment Contact information: [address] [place] [address] [phone]        Deetta Perla, MD. Call in 1 day(s).   Specialty: Neurosurgery Why: Please call for a post hospital follow up appointment Contact information: [place] [address] [phone]                The results of significant diagnostics from this hospitalization (including imaging, microbiology, ancillary and laboratory) are listed below for reference.    Significant Diagnostic Studies: DG Cervical Spine 2 or 3 views  Result Date: 06/04/2020 CLINICAL DATA:  Status post cervical  fusion. EXAM: CERVICAL SPINE - 2-3 VIEW COMPARISON:  June 03, 2020. FINDINGS: Status post surgical anterior fusion of C6-7. Severe degenerative disc disease of C5-6 is noted. IMPRESSION: Status post surgical anterior fusion of C6-7. Electronically Signed   By: Marijo Conception M.D.   On: 06/04/2020 14:29   DG Cervical Spine 2 or 3 views  Result Date: 06/03/2020 CLINICAL DATA:  Cervical fusion EXAM: CERVICAL SPINE - 2-3 VIEW; DG C-ARM 1-60 MIN COMPARISON:  MRI from earlier in the same day. FLUOROSCOPY TIME:  Radiation Exposure Index (as provided by the fluoroscopic device): Not available If the device does not provide the exposure index: Fluoroscopy Time: 6 seconds Number of Acquired Images:  2 FINDINGS: Initial image demonstrates a surgical instrument anterior to the cervical spine at the C6-7 level. Mild anterolisthesis of C6 on C7 is seen. Subsequent interbody fusion and anterior fixation is noted at C6-7. IMPRESSION: Interbody fusion at C6-7 with anterior fixation Electronically Signed   By: Inez Catalina M.D.   On: 06/03/2020 20:21   DG Shoulder Right  Result Date: 06/03/2020 CLINICAL DATA:  Pain status post fall EXAM: RIGHT SHOULDER - 2+ VIEW COMPARISON:  None. FINDINGS: There is no evidence of fracture or dislocation. There is no evidence of arthropathy or other focal bone abnormality. Soft tissues are unremarkable. IMPRESSION: Negative. Electronically Signed   By: Constance Holster M.D.   On: 06/03/2020 02:54   DG Elbow Complete Left  Result Date: 06/03/2020 CLINICAL DATA:  Pain EXAM: LEFT ELBOW - COMPLETE 3+ VIEW COMPARISON:  None. FINDINGS: There is soft tissue swelling about the elbow. Degenerative changes are noted. There is no joint effusion. IMPRESSION: Soft tissue swelling without evidence of an acute displaced fracture. Electronically Signed   By: Constance Holster M.D.   On: 06/03/2020 02:57   DG Elbow Complete Right  Result Date: 06/03/2020 CLINICAL DATA:  Pain EXAM: RIGHT  ELBOW - COMPLETE 3+ VIEW COMPARISON:  None. FINDINGS: There is no evidence of fracture, dislocation, or joint effusion. There is no evidence of arthropathy or other focal bone abnormality. Soft tissues are unremarkable. IMPRESSION: Negative. Electronically Signed   By: Constance Holster M.D.   On: 06/03/2020 02:58   DG Abdomen 1 View  Result Date: 06/03/2020 CLINICAL DATA:  Foreign body evaluation for MRI. EXAM: ABDOMEN - 1 VIEW COMPARISON:  None. FINDINGS: No radiopaque foreign body is identified. Gas is present in nondilated loops of small and large bowel without evidence of obstruction. No acute osseous abnormality is seen. IMPRESSION: No radiopaque foreign body identified. Electronically Signed   By: Logan Bores M.D.   On: 06/03/2020 04:33   CT HEAD WO CONTRAST  Result Date: 06/03/2020 CLINICAL DATA:  79 year old male with altered mental status. Status  post fall down stairs with acute lower cervical spine fracture with dorsal hematoma in the spinal canal contributing to moderate spinal stenosis at C6-C7. Multilevel degenerative spinal stenosis. Postoperative day zero ACDF. EXAM: CT HEAD WITHOUT CONTRAST TECHNIQUE: Contiguous axial images were obtained from the base of the skull through the vertex without intravenous contrast. COMPARISON:  Head CT 0150 hours today. FINDINGS: Brain: No midline shift, mass effect, or evidence of intracranial mass lesion. No ventriculomegaly. No acute intracranial hemorrhage identified. Widely scattered and confluent bilateral cerebral white matter hypodensity appears stable from earlier today. No areas of definite cerebral edema. Vascular: Calcified atherosclerosis at the skull base. Intracranial vascular density appears to be stable from earlier today. No changes of acute cortically based infarct are identified. Skull: No skull fracture identified. Sinuses/Orbits: Stable left maxillary mucous retention cyst. Other visualized paranasal sinuses and mastoids are stable  and well pneumatized. Other: Stable orbit and scalp soft tissues. IMPRESSION: Stable non contrast CT appearance of the brain from 0150 hours today. Advanced bilateral white matter disease with no acute intracranial abnormality identified. Electronically Signed   By: Genevie Ann M.D.   On: 06/03/2020 21:56   CT Head Wo Contrast  Result Date: 06/03/2020 CLINICAL DATA:  Fall downstairs, head injury, headache EXAM: CT HEAD WITHOUT CONTRAST CT CERVICAL SPINE WITHOUT CONTRAST TECHNIQUE: Multidetector CT imaging of the head and cervical spine was performed following the standard protocol without intravenous contrast. Multiplanar CT image reconstructions of the cervical spine were also generated. COMPARISON:  None. FINDINGS: CT HEAD FINDINGS Brain: Normal anatomic configuration of the brain. Mild parenchymal volume loss is commensurate with the patient's age. There are moderate to severe periventricular and subcortical white matter changes present likely reflecting the sequela of small vessel ischemia. 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 No acute intracranial hemorrhage or infarct. No abnormal mass effect or midline shift. No abnormal intra or extra-axial mass lesion or fluid collection. Ventricular size is normal. Cerebellum is unremarkable. Vascular: No asymmetric hyperdense vasculature at the skull base. Skull: Intact Sinuses/Orbits: The paranasal sinuses are clear. The orbits are unremarkable. Other: Mastoid air cells and middle ear cavities are clear. CT CERVICAL SPINE FINDINGS Alignment: There is 2 mm anterolisthesis of C6 upon C7, likely posttraumatic in nature. Minimal anterolisthesis of C4 upon C5 is likely physiologic. Skull base and vertebrae: The craniocervical junction is unremarkable. Atlantal dental interval is normal. There are acute fractures involving the left lamina and spinous process of C6. There is a fracture involving the left pedicle of C6 as well as the the superior  articular facet of C7 extending into the left C6-7 facet with mild joint space widening noted. Finally, a fracture extends through the pedicle of C6 on the right. Soft tissues and spinal canal: A small epidural hematoma measuring 3 mm x 10 mm is seen superficial to the C6 laminar fracture demonstrating minimal effacement of the canal a smaller epidural hematoma is seen superiorly posterior to the thecal sac at C5-6. No paraspinal fluid collection is identified. There is subcutaneous infiltration within the posterior soft tissues superficial to the spinous processes likely representing edema. Disc levels: Review of the sagittal reformats demonstrates preservation of vertebral body height. There is intervertebral disc space narrowing and endplate remodeling at D6-U4 in keeping with changes of moderate degenerative disc disease. 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 Review of the axial images confirms the above-mentioned fractures. There is multilevel uncovertebral and facet arthrosis resulting in multilevel mild neural foraminal narrowing. There is mild  diffuse congenital narrowing of the spinal canal. Upper chest: Unremarkable Other: None significant IMPRESSION: No acute intracranial injury.  No calvarial fracture. Acute fracture of the cervical spine predominantly involving the posterior elements of C6 with fracture bilateral pedicles and minimal anterolisthesis of C6 upon C7. The fracture pattern is suggestive of a hyperextension injury. Fractures also are identified involving the spinous process of C6, the left lamina of C6, and the left C7 superior articular facet extending into the left C6-7 facet which demonstrates mild relative widening. 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 Superimposed small epidural hematoma at the level of the fracture and at C5-6 slightly superiorly. These results will be called to the ordering clinician or representative by the Radiologist Assistant, and  communication documented in the PACS or [employer]. Electronically Signed   By: Fidela Salisbury MD   On: 06/03/2020 02:26   CT CERVICAL SPINE WO CONTRAST  Result Date: 06/03/2020 CLINICAL DATA:  79 year old male with altered mental status. Status post fall down stairs with acute lower cervical spine fracture with dorsal hematoma in the spinal canal contributing to moderate spinal stenosis at C6-C7. Multilevel degenerative spinal stenosis. Postoperative day zero ACDF. EXAM: CT CERVICAL SPINE WITHOUT CONTRAST TECHNIQUE: Multidetector CT imaging of the cervical spine was performed without intravenous contrast. Multiplanar CT image reconstructions were also generated. COMPARISON:  Cervical spine CT 0150 hours today. Preoperative cervical spine MRI 0503 hours today. FINDINGS: Alignment: Mildly improved cervical lordosis from earlier today. Subtle anterolisthesis of C4 on C5 is stable and appears to be degenerative in nature. Mild posttraumatic anterolisthesis of C6 on C7 appears stable. See additional details below. Skull base and vertebrae: Bilateral C6 pedicle fractures remain nondisplaced. There is less distraction of the C6 lamina fractures now. A nondisplaced fracture through the left C7 superior articulating facet and lamina is stable (sagittal image 41 now). Postoperative details are below. No new osseous abnormality identified. Stable subchondral cyst at the left odontoid. Visualized skull base is intact. No atlanto-occipital dissociation. Soft tissues and spinal canal: Ventral and posterior increased intraspinal density (sagittal image 30 with soft tissue windows) appear stable from the CT earlier this morning and more resembles ligamentous hypertrophy than intraspinal hemorrhage on the MRI. Small volume of prevertebral and small to moderate volume left lateral neck soft tissue gas is related to interval ACDF. No postoperative fluid collection is evident. Disc levels: Stable CT appearance of the level  C2-C3 through C4-C5. Including moderate to severe multifactorial spinal stenosis at C3-C4 (series 3, image 38). C5-C6: Stable disc space loss, circumferential disc osteophyte complex. Mild spinal stenosis, severe left and moderate right C6 foraminal stenosis at this level appears stable from the preoperative MRI. C6-C7: Interval ACDF. Hardware appears intact with no adverse features. Small volume of posterior dural or epidural hemorrhage here appears not significantly changed (series 3, image 56), about 2 mm in thickness. No CT evidence of increased spinal stenosis. And C7 foraminal patency especially on the left appears improved. C7-T1:  Stable, relatively negative. Upper chest: Visible upper thoracic levels remain grossly intact. Mild apical lung scarring on the left is stable. IMPRESSION: 1. Interval ACDF with no adverse hardware features. Bilateral C6 posterior element and left C7 superior articulating facet fractures now are largely nondisplaced. A small volume of dorsal intraspinal hemorrhage eccentric to the left appears stable. No increased spinal stenosis here evident by CT, and C7 foraminal patency appears improved especially on the left. 2. Degenerative lumbar spinal stenosis elsewhere appears stable, and is moderate to severe at  C3-C4 as previously noted. 3. No new osseous abnormality identified. Small volume postoperative gas in the neck. Electronically Signed   By: Genevie Ann M.D.   On: 06/03/2020 22:11   CT Cervical Spine Wo Contrast  Result Date: 06/03/2020 CLINICAL DATA:  Fall downstairs, head injury, headache EXAM: CT HEAD WITHOUT CONTRAST CT CERVICAL SPINE WITHOUT CONTRAST TECHNIQUE: Multidetector CT imaging of the head and cervical spine was performed following the standard protocol without intravenous contrast. Multiplanar CT image reconstructions of the cervical spine were also generated. COMPARISON:  None. FINDINGS: CT HEAD FINDINGS Brain: Normal anatomic configuration of the brain. Mild  parenchymal volume loss is commensurate with the patient's age. There are moderate to severe periventricular and subcortical white matter changes present likely reflecting the sequela of small vessel ischemia. 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 No acute intracranial hemorrhage or infarct. No abnormal mass effect or midline shift. No abnormal intra or extra-axial mass lesion or fluid collection. Ventricular size is normal. Cerebellum is unremarkable. Vascular: No asymmetric hyperdense vasculature at the skull base. Skull: Intact Sinuses/Orbits: The paranasal sinuses are clear. The orbits are unremarkable. Other: Mastoid air cells and middle ear cavities are clear. CT CERVICAL SPINE FINDINGS Alignment: There is 2 mm anterolisthesis of C6 upon C7, likely posttraumatic in nature. Minimal anterolisthesis of C4 upon C5 is likely physiologic. Skull base and vertebrae: The craniocervical junction is unremarkable. Atlantal dental interval is normal. There are acute fractures involving the left lamina and spinous process of C6. There is a fracture involving the left pedicle of C6 as well as the the superior articular facet of C7 extending into the left C6-7 facet with mild joint space widening noted. Finally, a fracture extends through the pedicle of C6 on the right. Soft tissues and spinal canal: A small epidural hematoma measuring 3 mm x 10 mm is seen superficial to the C6 laminar fracture demonstrating minimal effacement of the canal a smaller epidural hematoma is seen superiorly posterior to the thecal sac at C5-6. No paraspinal fluid collection is identified. There is subcutaneous infiltration within the posterior soft tissues superficial to the spinous processes likely representing edema. Disc levels: Review of the sagittal reformats demonstrates preservation of vertebral body height. There is intervertebral disc space narrowing and endplate remodeling at J1-B1 in keeping with changes of  moderate degenerative disc disease. 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 Review of the axial images confirms the above-mentioned fractures. There is multilevel uncovertebral and facet arthrosis resulting in multilevel mild neural foraminal narrowing. 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 Upper chest: Unremarkable Other: None significant IMPRESSION: No acute intracranial injury.  No calvarial fracture. Acute fracture of the cervical spine predominantly involving the posterior elements of C6 with fracture bilateral pedicles and minimal anterolisthesis of C6 upon C7. The fracture pattern is suggestive of a hyperextension injury. Fractures also are identified involving the spinous process of C6, the left lamina of C6, and the left C7 superior articular facet extending into the left C6-7 facet which demonstrates mild relative widening. 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 Superimposed small epidural hematoma at the level of the fracture and at C5-6 slightly superiorly. These results will be called to the ordering clinician or representative by the Radiologist Assistant, and communication documented in the PACS or [employer]. Electronically Signed   By: Fidela Salisbury MD   On: 06/03/2020 02:26   MR Cervical Spine Wo Contrast  Result Date: 06/03/2020 CLINICAL DATA:  Fall with  cervical spine fracture. EXAM: MRI CERVICAL SPINE WITHOUT CONTRAST TECHNIQUE: Multiplanar, multisequence MR imaging of the cervical spine was performed. No intravenous contrast was administered. COMPARISON:  Cervical spine CT 06/03/2020 FINDINGS: Image quality is degraded by motion artifact and increased image noise due to the presence of a cervical spine collar limiting coil positioning. Alignment: Unchanged trace anterolisthesis of C6 on C7. Vertebrae: Mild posterior element edema at C6-7 related to underlying fractures, better demonstrated on CT. Chronic degenerative endplate changes  at T0-1 and C6-7. Cord: No convincing spinal cord signal abnormality is identified within study limitations. A small dorsal epidural hematoma at C6-7 measures up to 3 mm in thickness. Posterior Fossa, vertebral arteries, paraspinal tissues: Minimal prevertebral edema. Mild right and moderate left-sided posterior paraspinal soft tissue edema in the mid and lower cervical spine including in the inter spinous soft tissues at C5-6 and C6-7. Disc levels: The cervical spinal canal is diffusely narrow on a congenital basis. Detailed assessment of disc and facet degeneration is limited by motion, however there is severe multifactorial spinal stenosis at C3-4 greater than C4-5 with moderate cord flattening. Multifactorial spinal stenosis at C5-6 is moderate, and there is also moderate spinal stenosis at C6-7 in part secondary to the epidural hematoma. IMPRESSION: 1. Motion degraded examination. 2. Known posterior element fractures at C6-7 with associated mild marrow edema and moderate paraspinal soft tissue/posterior ligamentous complex edema. 3. Small dorsal epidural hematoma at C6-7 contributing to moderate spinal stenosis. 4. No convincing spinal cord signal abnormality. 5. 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 Electronically Signed   By: Logan Bores M.D.   On: 06/03/2020 06:12   DG Chest Portable 1 View  Result Date: 06/03/2020 CLINICAL DATA:  Foreign body evaluation for MRI. EXAM: PORTABLE CHEST 1 VIEW COMPARISON:  None. FINDINGS: The cardiomediastinal silhouette is within normal limits. The patient's chin projects over the right lung apex. There is slight prominence of the interstitial markings without overt pulmonary edema. No confluent airspace opacity, sizeable pleural effusion, or pneumothorax is identified. No acute osseous abnormality is seen. IMPRESSION: No radiopaque foreign body identified. Electronically Signed   By: Logan Bores M.D.   On: 06/03/2020 04:35   DG Shoulder  Left  Result Date: 06/03/2020 CLINICAL DATA:  Pain EXAM: LEFT SHOULDER - 2+ VIEW COMPARISON:  None. FINDINGS: There is no evidence of fracture or dislocation. There is no evidence of arthropathy or other focal bone abnormality. Soft tissues are unremarkable. IMPRESSION: Negative. Electronically Signed   By: Constance Holster M.D.   On: 06/03/2020 02:55   DG C-Arm 1-60 Min  Result Date: 06/03/2020 CLINICAL DATA:  Cervical fusion EXAM: CERVICAL SPINE - 2-3 VIEW; DG C-ARM 1-60 MIN COMPARISON:  MRI from earlier in the same day. FLUOROSCOPY TIME:  Radiation Exposure Index (as provided by the fluoroscopic device): Not available If the device does not provide the exposure index: Fluoroscopy Time: 6 seconds Number of Acquired Images:  2 FINDINGS: Initial image demonstrates a surgical instrument anterior to the cervical spine at the C6-7 level. Mild anterolisthesis of C6 on C7 is seen. Subsequent interbody fusion and anterior fixation is noted at C6-7. IMPRESSION: Interbody fusion at C6-7 with anterior fixation Electronically Signed   By: Inez Catalina M.D.   On: 06/03/2020 20:21    Microbiology: Recent Results (from the past 240 hour(s))  Respiratory Panel by RT PCR (Flu A&B, Covid) - Nasopharyngeal Swab     Status: None   Collection Time: 06/03/20  3:20 AM   Specimen: Nasopharyngeal  Swab  Result Value Ref Range Status   SARS Coronavirus 2 by RT PCR NEGATIVE NEGATIVE Final    Comment: (NOTE) SARS-CoV-2 target nucleic acids are NOT DETECTED.  The SARS-CoV-2 RNA is generally detectable in upper respiratoy specimens during the acute phase of infection. The lowest concentration of SARS-CoV-2 viral copies this assay can detect is 131 copies/mL. A negative result does not preclude SARS-Cov-2 infection and should not be used as the sole basis for treatment or other patient management decisions. A negative result may occur with  improper specimen collection/handling, submission of specimen other than  nasopharyngeal swab, presence of viral mutation(s) within the areas targeted by this assay, and inadequate number of viral copies (<131 copies/mL). A negative result must be combined with clinical observations, patient history, and epidemiological information. The expected result is Negative.  Fact Sheet for Patients:  PinkCheek.be  Fact Sheet for Healthcare Providers:  GravelBags.it  This test is no t yet approved or cleared by the [place] FDA and  has been authorized for detection and/or diagnosis of SARS-CoV-2 by FDA under an Emergency Use Authorization (EUA). This EUA will remain  in effect (meaning this test can be used) for the duration of the COVID-19 declaration under Section 564(b)(1) of the Act, 21 U.S.C. section 360bbb-3(b)(1), unless the authorization is terminated or revoked sooner.     Influenza A by PCR NEGATIVE NEGATIVE Final   Influenza B by PCR NEGATIVE NEGATIVE Final    Comment: (NOTE) The Xpert Xpress SARS-CoV-2/FLU/RSV assay is intended as an aid in  the diagnosis of influenza from Nasopharyngeal swab specimens and  should not be used as a sole basis for treatment. Nasal washings and  aspirates are unacceptable for Xpert Xpress SARS-CoV-2/FLU/RSV  testing.  Fact Sheet for Patients: PinkCheek.be  Fact Sheet for Healthcare Providers: GravelBags.it  This test is not yet approved or cleared by the [place] FDA and  has been authorized for detection and/or diagnosis of SARS-CoV-2 by  FDA under an Emergency Use Authorization (EUA). This EUA will remain  in effect (meaning this test can be used) for the duration of the  Covid-19 declaration under Section 564(b)(1) of the Act, 21  U.S.C. section 360bbb-3(b)(1), unless the authorization is  terminated or revoked. Performed at [facility], [address] [place], [place]  [address]      Labs: Basic Metabolic Panel: Recent Labs  Lab 06/03/20 0320 06/03/20 2227 06/05/20 1519 06/06/20 0426 06/06/20 1245  NA 139 138 139 144  --   K 3.4* 3.8 3.8 4.0  --   CL 105 105 104 104  --   CO2 20* 19* 23 27  --   GLUCOSE 148* 151* 219* 152* 394*  BUN 27* 15 23 23   --   CREATININE 1.17 0.85 0.92 0.96  --   CALCIUM 9.0 8.2* 8.7* 8.7*  --    Liver Function Tests: Recent Labs  Lab 06/05/20 1519  AST 26  ALT 18  ALKPHOS 62  BILITOT 1.3*  PROT 6.7  ALBUMIN 3.6   No results for input(s): LIPASE, AMYLASE in the last 168 hours. No results for input(s): AMMONIA in the last 168 hours. CBC: Recent Labs  Lab 06/03/20 0320 06/03/20 2227 06/05/20 1519 06/06/20 0425  WBC 12.1* 11.1* 16.6* 13.0*  NEUTROABS 10.4*  --  15.3*  --   HGB 14.7 14.1 14.5 14.1  HCT 43.9 42.2 42.1 42.4  MCV 87.8 88.1 85.1 87.6  PLT 138* 158 166 169   Cardiac Enzymes:  No results for input(s): CKTOTAL, CKMB, CKMBINDEX, TROPONINI in the last 168 hours. BNP: BNP (last 3 results) No results for input(s): BNP in the last 8760 hours.  ProBNP (last 3 results) No results for input(s): PROBNP in the last 8760 hours.  CBG: Recent Labs  Lab 06/07/20 1138 06/07/20 1655 06/07/20 2334 06/08/20 0745 06/08/20 1145  GLUCAP 198* 179* 165* 119* 258*       Signed:  Kayleen Memos, MD [facility] 06/08/2020, 2:09 PM

## 2020-06-08 NOTE — TOC Progression Note (Signed)
Transition of Care [facility]) - Progression Note    Patient Details  Name: TRAXTON KOLENDA MRN: [id number] Date of Birth: [birth year]  [facility] CM/SW Contact  Boris Sharper, LCSW Phone Number: 06/08/2020, 11:24 AM  Clinical Narrative:    CSW called [facility] to see if they are able to accept the pt, no answer left voicemail.  TOC will continue to follow   Expected Discharge Plan: [place] Barriers to Discharge: No Barriers Identified  Expected Discharge Plan and Services Expected Discharge Plan: [place] Choice: [place] arrangements for the past 2 months: Single Family Home Expected Discharge Date: 06/08/20                                     Social Determinants of Health (SDOH) Interventions    Readmission Risk Interventions No flowsheet data found.

## 2020-06-09 DIAGNOSIS — S129XXA Fracture of neck, unspecified, initial encounter: Secondary | ICD-10-CM | POA: Diagnosis not present

## 2020-06-09 LAB — CBC
HCT: 42.7 % (ref 39.0–52.0)
Hemoglobin: 14.1 g/dL (ref 13.0–17.0)
MCH: 29 pg (ref 26.0–34.0)
MCHC: 33 g/dL (ref 30.0–36.0)
MCV: 87.9 fL (ref 80.0–100.0)
Platelets: 158 10*3/uL (ref 150–400)
RBC: 4.86 MIL/uL (ref 4.22–5.81)
RDW: 13.1 % (ref 11.5–15.5)
WBC: 8.7 10*3/uL (ref 4.0–10.5)
nRBC: 0 % (ref 0.0–0.2)

## 2020-06-09 LAB — BASIC METABOLIC PANEL
Anion gap: 11 (ref 5–15)
BUN: 22 mg/dL (ref 8–23)
CO2: 26 mmol/L (ref 22–32)
Calcium: 8.8 mg/dL — ABNORMAL LOW (ref 8.9–10.3)
Chloride: 105 mmol/L (ref 98–111)
Creatinine, Ser: 0.88 mg/dL (ref 0.61–1.24)
GFR, Estimated: >60 mL/min (ref >60)
Glucose, Bld: 104 mg/dL — ABNORMAL HIGH (ref 70–99)
Potassium: 3.9 mmol/L (ref 3.5–5.1)
Sodium: 142 mmol/L (ref 135–145)

## 2020-06-09 LAB — GLUCOSE, CAPILLARY
Glucose-Capillary: 101 mg/dL — ABNORMAL HIGH (ref 70–99)
Glucose-Capillary: 231 mg/dL — ABNORMAL HIGH (ref 70–99)
Glucose-Capillary: 96 mg/dL (ref 70–99)
Glucose-Capillary: 192 mg/dL — ABNORMAL HIGH (ref 70–99)

## 2020-06-09 NOTE — Progress Notes (Signed)
Discharge Summary  CLANTON EMANUELSON ZJQ:[id number] DOB: 1940/12/28  PCP: Lesia Hausen, PA  Admit date: 06/03/2020 Discharge date: 06/09/2020  Time spent: 35 minutes  Recommendations for Outpatient Follow-up:  1. Follow up with neurosurgery 2. Follow up with your PCP 3. Take your medications as prescribed 4. Continue PT OT with assistance and fall precautions   Discharge Diagnoses:  Active Hospital Problems   Diagnosis Date Noted  . Traumatic fracture of cervical spine ([facility]) 06/03/2020    Resolved Hospital Problems  No resolved problems to display.    Discharge Condition: Stable   Diet recommendation: Resume previous diet   Vitals:   06/08/20 2344 06/09/20 0738  BP: 135/85 (!) 150/91  Pulse: 83 81  Resp: 17 16  Temp: 98.2 F (36.8 C) 98.5 F (36.9 C)  SpO2: 97% 97%    History of present illness:  Mitchell Stewart Oswald.Llan[age] y.o.malewith a known history of type 2 diabetes mellitus, dyslipidemia, prostate cancer, dementia and chronic depression, presented to [facility] ED with acute onset of accidental mechanical fall after getting up to go to the bathroom and falling down a flight of 15 stairs landing on his back on concrete with subsequent neck pain as well as bilateral elbow pain. Patient has significant dementia and unable to provide a history or review of systems.  CT cervical spine wo contrast done on 06/03/20 prior to neurosurgery showed:  Acute fracture of the cervical spine predominantly involving the posterior elements of C6 with fracture bilateral pedicles and minimal anterolisthesis of C6 upon C7. The fracture pattern is suggestive of a hyperextension injury. Fractures also are identified involving the spinous process of C6, the left lamina of C6, and the left C7 superior articular facet extending into the left C6-7 facet which demonstrates mild relative widening.  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 Superimposed small epidural hematoma at the level  of the fracture and at C5-6 slightly Superiorly.  CT head showed: No acute intracranial injury.  No calvarial fracture.  Seen by neurosurgery on 06/03/20 and was taken to the OR.  He had the following procedures: Procedure(s): ARTHRODESIS, ANTERIOR INTERBODY, INCLUDING DISC SPACE PREPARATION, DISCECTOMY, OSTEOPHYTECTOMY AND DECOMPRESSION OF SPINAL CORD AND/ORNERVE ROOTS; CERVICAL BELOW C2--C6-7ACDF ARTHRODESIS ANTERIOR INTERBODY W/DISCECTOMY ADDITIONAL FIFTH ANTERIOR INSTRUMENTATION; 2 TO 3 VERTEBRAL SEGMENTS (LIST IN ADDITION TO PRIMARY PROCEDURE) ALLOGRAFT, STRUCTURAL, FOR SPINE SURGERY ONLY (LIST IN ADDITION TO PRIMARY PROCEDURE)   SURGEON: Surgeon(s) and Role: * Malen Gauze, MD - Primary  Christine Zdeb,PA, Asisstant  ANESTHESIA:General   OPERATIVE FINDINGS:Fracture at C6-7     06/09/20: Seen and examined at his bedside.  No acute events overnight.  Patient is pleasantly confused.  He has no new complaints at this time.  Awaiting bed placement.    Hospital Course:  Active Problems:   Traumatic fracture of cervical spine (HCC)  Acute C6-C7 traumatic spine fracture secondary to mechanical fall post repair by neurosurgery on 06/03/20. -Continueimmobilizing his C-spine with neck collar until seen by neurosurgery outpatient. -C-spine MRI -- unstable C6-7 fracture, was taken to the OR as stated above. -Pain management with PO pain meds -Neurosurgery consultation with Dr. Deetta Perla -POD #10fixation of C6/7 fracture  -PT OT recommended SNF and 24 hours assistance --[facility] assisting with SNF placement.  Type II diabetesmellitus with hyperglycemia --A1C 7.6 on 06/03/20 -Increase Lantus dose to 7 units twice daily Increase insulin sliding scale to moderate CBG improved with increased subcu insulin doses.  Dyslipidemia. -continue statin therapy.  Dementia without behavioral disturbance. No behavioral issues  since admitted. Continue  home Seroquel  Chronic depression Stable Continue home Zoloft  BPH. -continue Flomax. Monitor urine output  History of prostate cancer Not on home cancer medications   DVT prophylaxis. -Lovenox, subcu daily.  Resolved RUQ pain unclear etiology UA negative for pyuria.   CODE STATUS: Full code    Procedures:  C6-C7 cervical spine fracture repair  Consultations:  Neurosurgery  Discharge Exam:  BP (!) 150/91 (BP Location: Left Arm)   Pulse 81   Temp 98.5 F (36.9 C) (Axillary)   Resp 16   Ht 5\' 6"  (1.676 m)   Wt 56.7 kg   SpO2 97%   BMI 20.18 kg/m  . General: 79 y.o. year-old male well-developed well-nourished in no distress.  Pleasantly confused.  Neck collar in Place. . Cardiovascular: Regular rate and rhythm no rubs or gallops. Marland Kitchen Respiratory: Clear to auscultation no wheezes or rales.   . Abdomen: Soft nontender normal bowel sounds present.   . Musculoskeletal: No Lower extremity edema bilaterally.   Marland Kitchen Psychiatry: Mood is appropriate for condition and setting.  Discharge Instructions You were cared for by a hospitalist during your hospital stay. If you have any questions about your discharge medications or the care you received while you were in the hospital after you are discharged, you can call the unit and asked to speak with the hospitalist on call if the hospitalist that took care of you is not available. Once you are discharged, your primary care physician will handle any further medical issues. Please note that NO REFILLS for any discharge medications will be authorized once you are discharged, as it is imperative that you return to your primary care physician (or establish a relationship with a primary care physician if you do not have one) for your aftercare needs so that they can reassess your need for medications and monitor your lab values.   Allergies as of 06/09/2020   No Known Allergies     Medication List    TAKE these medications    Basaglar KwikPen 100 UNIT/ML Inject 10 Units into the skin daily.   loratadine 10 MG tablet Commonly known as: CLARITIN Take 10 mg by mouth daily.   polyethylene glycol 17 g packet Commonly known as: MiraLax Take 17 g by mouth daily as needed.   pravastatin 20 MG tablet Commonly known as: PRAVACHOL Take 1 tablet (20 mg total) by mouth daily at 6 PM.   QUEtiapine 25 MG tablet Commonly known as: SEROQUEL Take 25 mg by mouth daily at 4 PM.   sertraline 100 MG tablet Commonly known as: ZOLOFT Take 1.5 tablets by mouth daily.   tamsulosin 0.4 MG Caps capsule Commonly known as: FLOMAX Take 0.4 mg by mouth daily.   traMADol 50 MG tablet Commonly known as: ULTRAM Take 1 tablet (50 mg total) by mouth every 12 (twelve) hours as needed for up to 3 days for severe pain.            Durable Medical Equipment  (From admission, onward)         Start     Ordered   06/06/20 0604  For home use only DME Walker rolling  Once       Question Answer Comment  Walker: With 5 Inch Wheels   Patient needs a walker to treat with the following condition Ambulatory dysfunction      06/06/20 0604         No Known Allergies  Follow-up Information    [place],  Shanon Brow, [place]. Call in 1 day(s).   Specialty: Internal Medicine Why: Please call for a post hospital follow up appointment Contact information: [address] [place] [address] [phone]        Deetta Perla, MD. Call in 1 day(s).   Specialty: Neurosurgery Why: Please call for a post hospital follow up appointment Contact information: [address] [address] [phone]                The results of significant diagnostics from this hospitalization (including imaging, microbiology, ancillary and laboratory) are listed below for reference.    Significant Diagnostic Studies: DG Cervical Spine 2 or 3 views  Result Date: 06/04/2020 CLINICAL DATA:  Status post cervical fusion. EXAM:  CERVICAL SPINE - 2-3 VIEW COMPARISON:  June 03, 2020. FINDINGS: Status post surgical anterior fusion of C6-7. Severe degenerative disc disease of C5-6 is noted. IMPRESSION: Status post surgical anterior fusion of C6-7. Electronically Signed   By: Marijo Conception M.D.   On: 06/04/2020 14:29   DG Cervical Spine 2 or 3 views  Result Date: 06/03/2020 CLINICAL DATA:  Cervical fusion EXAM: CERVICAL SPINE - 2-3 VIEW; DG C-ARM 1-60 MIN COMPARISON:  MRI from earlier in the same day. FLUOROSCOPY TIME:  Radiation Exposure Index (as provided by the fluoroscopic device): Not available If the device does not provide the exposure index: Fluoroscopy Time: 6 seconds Number of Acquired Images:  2 FINDINGS: Initial image demonstrates a surgical instrument anterior to the cervical spine at the C6-7 level. Mild anterolisthesis of C6 on C7 is seen. Subsequent interbody fusion and anterior fixation is noted at C6-7. IMPRESSION: Interbody fusion at C6-7 with anterior fixation Electronically Signed   By: Inez Catalina M.D.   On: 06/03/2020 20:21   DG Shoulder Right  Result Date: 06/03/2020 CLINICAL DATA:  Pain status post fall EXAM: RIGHT SHOULDER - 2+ VIEW COMPARISON:  None. FINDINGS: There is no evidence of fracture or dislocation. There is no evidence of arthropathy or other focal bone abnormality. Soft tissues are unremarkable. IMPRESSION: Negative. Electronically Signed   By: Constance Holster M.D.   On: 06/03/2020 02:54   DG Elbow Complete Left  Result Date: 06/03/2020 CLINICAL DATA:  Pain EXAM: LEFT ELBOW - COMPLETE 3+ VIEW COMPARISON:  None. FINDINGS: There is soft tissue swelling about the elbow. Degenerative changes are noted. There is no joint effusion. IMPRESSION: Soft tissue swelling without evidence of an acute displaced fracture. Electronically Signed   By: Constance Holster M.D.   On: 06/03/2020 02:57   DG Elbow Complete Right  Result Date: 06/03/2020 CLINICAL DATA:  Pain EXAM: RIGHT ELBOW - COMPLETE  3+ VIEW COMPARISON:  None. FINDINGS: There is no evidence of fracture, dislocation, or joint effusion. There is no evidence of arthropathy or other focal bone abnormality. Soft tissues are unremarkable. IMPRESSION: Negative. Electronically Signed   By: Constance Holster M.D.   On: 06/03/2020 02:58   DG Abdomen 1 View  Result Date: 06/03/2020 CLINICAL DATA:  Foreign body evaluation for MRI. EXAM: ABDOMEN - 1 VIEW COMPARISON:  None. FINDINGS: No radiopaque foreign body is identified. Gas is present in nondilated loops of small and large bowel without evidence of obstruction. No acute osseous abnormality is seen. IMPRESSION: No radiopaque foreign body identified. Electronically Signed   By: Logan Bores M.D.   On: 06/03/2020 04:33   CT HEAD WO CONTRAST  Result Date: 06/03/2020 CLINICAL DATA:  79 year old male with altered mental status. Status post  fall down stairs with acute lower cervical spine fracture with dorsal hematoma in the spinal canal contributing to moderate spinal stenosis at C6-C7. Multilevel degenerative spinal stenosis. Postoperative day zero ACDF. EXAM: CT HEAD WITHOUT CONTRAST TECHNIQUE: Contiguous axial images were obtained from the base of the skull through the vertex without intravenous contrast. COMPARISON:  Head CT 0150 hours today. FINDINGS: Brain: No midline shift, mass effect, or evidence of intracranial mass lesion. No ventriculomegaly. No acute intracranial hemorrhage identified. Widely scattered and confluent bilateral cerebral white matter hypodensity appears stable from earlier today. No areas of definite cerebral edema. Vascular: Calcified atherosclerosis at the skull base. Intracranial vascular density appears to be stable from earlier today. No changes of acute cortically based infarct are identified. Skull: No skull fracture identified. Sinuses/Orbits: Stable left maxillary mucous retention cyst. Other visualized paranasal sinuses and mastoids are stable and well  pneumatized. Other: Stable orbit and scalp soft tissues. IMPRESSION: Stable non contrast CT appearance of the brain from 0150 hours today. Advanced bilateral white matter disease with no acute intracranial abnormality identified. Electronically Signed   By: Genevie Ann M.D.   On: 06/03/2020 21:56   CT Head Wo Contrast  Result Date: 06/03/2020 CLINICAL DATA:  Fall downstairs, head injury, headache EXAM: CT HEAD WITHOUT CONTRAST CT CERVICAL SPINE WITHOUT CONTRAST TECHNIQUE: Multidetector CT imaging of the head and cervical spine was performed following the standard protocol without intravenous contrast. Multiplanar CT image reconstructions of the cervical spine were also generated. COMPARISON:  None. FINDINGS: CT HEAD FINDINGS Brain: Normal anatomic configuration of the brain. Mild parenchymal volume loss is commensurate with the patient's age. There are moderate to severe periventricular and subcortical white matter changes present likely reflecting the sequela of small vessel ischemia. 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 No acute intracranial hemorrhage or infarct. No abnormal mass effect or midline shift. No abnormal intra or extra-axial mass lesion or fluid collection. Ventricular size is normal. Cerebellum is unremarkable. Vascular: No asymmetric hyperdense vasculature at the skull base. Skull: Intact Sinuses/Orbits: The paranasal sinuses are clear. The orbits are unremarkable. Other: Mastoid air cells and middle ear cavities are clear. CT CERVICAL SPINE FINDINGS Alignment: There is 2 mm anterolisthesis of C6 upon C7, likely posttraumatic in nature. Minimal anterolisthesis of C4 upon C5 is likely physiologic. Skull base and vertebrae: The craniocervical junction is unremarkable. Atlantal dental interval is normal. There are acute fractures involving the left lamina and spinous process of C6. There is a fracture involving the left pedicle of C6 as well as the the superior articular facet  of C7 extending into the left C6-7 facet with mild joint space widening noted. Finally, a fracture extends through the pedicle of C6 on the right. Soft tissues and spinal canal: A small epidural hematoma measuring 3 mm x 10 mm is seen superficial to the C6 laminar fracture demonstrating minimal effacement of the canal a smaller epidural hematoma is seen superiorly posterior to the thecal sac at C5-6. No paraspinal fluid collection is identified. There is subcutaneous infiltration within the posterior soft tissues superficial to the spinous processes likely representing edema. Disc levels: Review of the sagittal reformats demonstrates preservation of vertebral body height. There is intervertebral disc space narrowing and endplate remodeling at B9-T9 in keeping with changes of moderate degenerative disc disease. 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 Review of the axial images confirms the above-mentioned fractures. There is multilevel uncovertebral and facet arthrosis resulting in multilevel mild neural foraminal narrowing. There is mild diffuse  congenital narrowing of the spinal canal. Upper chest: Unremarkable Other: None significant IMPRESSION: No acute intracranial injury.  No calvarial fracture. Acute fracture of the cervical spine predominantly involving the posterior elements of C6 with fracture bilateral pedicles and minimal anterolisthesis of C6 upon C7. The fracture pattern is suggestive of a hyperextension injury. Fractures also are identified involving the spinous process of C6, the left lamina of C6, and the left C7 superior articular facet extending into the left C6-7 facet which demonstrates mild relative widening. 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 Superimposed small epidural hematoma at the level of the fracture and at C5-6 slightly superiorly. These results will be called to the ordering clinician or representative by the Radiologist Assistant, and communication  documented in the PACS or [employer]. Electronically Signed   By: Fidela Salisbury MD   On: 06/03/2020 02:26   CT CERVICAL SPINE WO CONTRAST  Result Date: 06/03/2020 CLINICAL DATA:  79 year old male with altered mental status. Status post fall down stairs with acute lower cervical spine fracture with dorsal hematoma in the spinal canal contributing to moderate spinal stenosis at C6-C7. Multilevel degenerative spinal stenosis. Postoperative day zero ACDF. EXAM: CT CERVICAL SPINE WITHOUT CONTRAST TECHNIQUE: Multidetector CT imaging of the cervical spine was performed without intravenous contrast. Multiplanar CT image reconstructions were also generated. COMPARISON:  Cervical spine CT 0150 hours today. Preoperative cervical spine MRI 0503 hours today. FINDINGS: Alignment: Mildly improved cervical lordosis from earlier today. Subtle anterolisthesis of C4 on C5 is stable and appears to be degenerative in nature. Mild posttraumatic anterolisthesis of C6 on C7 appears stable. See additional details below. Skull base and vertebrae: Bilateral C6 pedicle fractures remain nondisplaced. There is less distraction of the C6 lamina fractures now. A nondisplaced fracture through the left C7 superior articulating facet and lamina is stable (sagittal image 41 now). Postoperative details are below. No new osseous abnormality identified. Stable subchondral cyst at the left odontoid. Visualized skull base is intact. No atlanto-occipital dissociation. Soft tissues and spinal canal: Ventral and posterior increased intraspinal density (sagittal image 30 with soft tissue windows) appear stable from the CT earlier this morning and more resembles ligamentous hypertrophy than intraspinal hemorrhage on the MRI. Small volume of prevertebral and small to moderate volume left lateral neck soft tissue gas is related to interval ACDF. No postoperative fluid collection is evident. Disc levels: Stable CT appearance of the level C2-C3 through  C4-C5. Including moderate to severe multifactorial spinal stenosis at C3-C4 (series 3, image 38). C5-C6: Stable disc space loss, circumferential disc osteophyte complex. Mild spinal stenosis, severe left and moderate right C6 foraminal stenosis at this level appears stable from the preoperative MRI. C6-C7: Interval ACDF. Hardware appears intact with no adverse features. Small volume of posterior dural or epidural hemorrhage here appears not significantly changed (series 3, image 56), about 2 mm in thickness. No CT evidence of increased spinal stenosis. And C7 foraminal patency especially on the left appears improved. C7-T1:  Stable, relatively negative. Upper chest: Visible upper thoracic levels remain grossly intact. Mild apical lung scarring on the left is stable. IMPRESSION: 1. Interval ACDF with no adverse hardware features. Bilateral C6 posterior element and left C7 superior articulating facet fractures now are largely nondisplaced. A small volume of dorsal intraspinal hemorrhage eccentric to the left appears stable. No increased spinal stenosis here evident by CT, and C7 foraminal patency appears improved especially on the left. 2. Degenerative lumbar spinal stenosis elsewhere appears stable, and is moderate to severe at C3-C4  as previously noted. 3. No new osseous abnormality identified. Small volume postoperative gas in the neck. Electronically Signed   By: Genevie Ann M.D.   On: 06/03/2020 22:11   CT Cervical Spine Wo Contrast  Result Date: 06/03/2020 CLINICAL DATA:  Fall downstairs, head injury, headache EXAM: CT HEAD WITHOUT CONTRAST CT CERVICAL SPINE WITHOUT CONTRAST TECHNIQUE: Multidetector CT imaging of the head and cervical spine was performed following the standard protocol without intravenous contrast. Multiplanar CT image reconstructions of the cervical spine were also generated. COMPARISON:  None. FINDINGS: CT HEAD FINDINGS Brain: Normal anatomic configuration of the brain. Mild parenchymal  volume loss is commensurate with the patient's age. There are moderate to severe periventricular and subcortical white matter changes present likely reflecting the sequela of small vessel ischemia. 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 No acute intracranial hemorrhage or infarct. No abnormal mass effect or midline shift. No abnormal intra or extra-axial mass lesion or fluid collection. Ventricular size is normal. Cerebellum is unremarkable. Vascular: No asymmetric hyperdense vasculature at the skull base. Skull: Intact Sinuses/Orbits: The paranasal sinuses are clear. The orbits are unremarkable. Other: Mastoid air cells and middle ear cavities are clear. CT CERVICAL SPINE FINDINGS Alignment: There is 2 mm anterolisthesis of C6 upon C7, likely posttraumatic in nature. Minimal anterolisthesis of C4 upon C5 is likely physiologic. Skull base and vertebrae: The craniocervical junction is unremarkable. Atlantal dental interval is normal. There are acute fractures involving the left lamina and spinous process of C6. There is a fracture involving the left pedicle of C6 as well as the the superior articular facet of C7 extending into the left C6-7 facet with mild joint space widening noted. Finally, a fracture extends through the pedicle of C6 on the right. Soft tissues and spinal canal: A small epidural hematoma measuring 3 mm x 10 mm is seen superficial to the C6 laminar fracture demonstrating minimal effacement of the canal a smaller epidural hematoma is seen superiorly posterior to the thecal sac at C5-6. No paraspinal fluid collection is identified. There is subcutaneous infiltration within the posterior soft tissues superficial to the spinous processes likely representing edema. Disc levels: Review of the sagittal reformats demonstrates preservation of vertebral body height. There is intervertebral disc space narrowing and endplate remodeling at V7-C5 in keeping with changes of moderate  degenerative disc disease. 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 Review of the axial images confirms the above-mentioned fractures. There is multilevel uncovertebral and facet arthrosis resulting in multilevel mild neural foraminal narrowing. 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 Upper chest: Unremarkable Other: None significant IMPRESSION: No acute intracranial injury.  No calvarial fracture. Acute fracture of the cervical spine predominantly involving the posterior elements of C6 with fracture bilateral pedicles and minimal anterolisthesis of C6 upon C7. The fracture pattern is suggestive of a hyperextension injury. Fractures also are identified involving the spinous process of C6, the left lamina of C6, and the left C7 superior articular facet extending into the left C6-7 facet which demonstrates mild relative widening. 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 Superimposed small epidural hematoma at the level of the fracture and at C5-6 slightly superiorly. These results will be called to the ordering clinician or representative by the Radiologist Assistant, and communication documented in the PACS or [employer]. Electronically Signed   By: Fidela Salisbury MD   On: 06/03/2020 02:26   MR Cervical Spine Wo Contrast  Result Date: 06/03/2020 CLINICAL DATA:  Fall with cervical  spine fracture. EXAM: MRI CERVICAL SPINE WITHOUT CONTRAST TECHNIQUE: Multiplanar, multisequence MR imaging of the cervical spine was performed. No intravenous contrast was administered. COMPARISON:  Cervical spine CT 06/03/2020 FINDINGS: Image quality is degraded by motion artifact and increased image noise due to the presence of a cervical spine collar limiting coil positioning. Alignment: Unchanged trace anterolisthesis of C6 on C7. Vertebrae: Mild posterior element edema at C6-7 related to underlying fractures, better demonstrated on CT. Chronic degenerative endplate changes at O1-6  and C6-7. Cord: No convincing spinal cord signal abnormality is identified within study limitations. A small dorsal epidural hematoma at C6-7 measures up to 3 mm in thickness. Posterior Fossa, vertebral arteries, paraspinal tissues: Minimal prevertebral edema. Mild right and moderate left-sided posterior paraspinal soft tissue edema in the mid and lower cervical spine including in the inter spinous soft tissues at C5-6 and C6-7. Disc levels: The cervical spinal canal is diffusely narrow on a congenital basis. Detailed assessment of disc and facet degeneration is limited by motion, however there is severe multifactorial spinal stenosis at C3-4 greater than C4-5 with moderate cord flattening. Multifactorial spinal stenosis at C5-6 is moderate, and there is also moderate spinal stenosis at C6-7 in part secondary to the epidural hematoma. IMPRESSION: 1. Motion degraded examination. 2. Known posterior element fractures at C6-7 with associated mild marrow edema and moderate paraspinal soft tissue/posterior ligamentous complex edema. 3. Small dorsal epidural hematoma at C6-7 contributing to moderate spinal stenosis. 4. No convincing spinal cord signal abnormality. 5. 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 Electronically Signed   By: Logan Bores M.D.   On: 06/03/2020 06:12   DG Chest Portable 1 View  Result Date: 06/03/2020 CLINICAL DATA:  Foreign body evaluation for MRI. EXAM: PORTABLE CHEST 1 VIEW COMPARISON:  None. FINDINGS: The cardiomediastinal silhouette is within normal limits. The patient's chin projects over the right lung apex. There is slight prominence of the interstitial markings without overt pulmonary edema. No confluent airspace opacity, sizeable pleural effusion, or pneumothorax is identified. No acute osseous abnormality is seen. IMPRESSION: No radiopaque foreign body identified. Electronically Signed   By: Logan Bores M.D.   On: 06/03/2020 04:35   DG Shoulder Left  Result  Date: 06/03/2020 CLINICAL DATA:  Pain EXAM: LEFT SHOULDER - 2+ VIEW COMPARISON:  None. FINDINGS: There is no evidence of fracture or dislocation. There is no evidence of arthropathy or other focal bone abnormality. Soft tissues are unremarkable. IMPRESSION: Negative. Electronically Signed   By: Constance Holster M.D.   On: 06/03/2020 02:55   DG C-Arm 1-60 Min  Result Date: 06/03/2020 CLINICAL DATA:  Cervical fusion EXAM: CERVICAL SPINE - 2-3 VIEW; DG C-ARM 1-60 MIN COMPARISON:  MRI from earlier in the same day. FLUOROSCOPY TIME:  Radiation Exposure Index (as provided by the fluoroscopic device): Not available If the device does not provide the exposure index: Fluoroscopy Time: 6 seconds Number of Acquired Images:  2 FINDINGS: Initial image demonstrates a surgical instrument anterior to the cervical spine at the C6-7 level. Mild anterolisthesis of C6 on C7 is seen. Subsequent interbody fusion and anterior fixation is noted at C6-7. IMPRESSION: Interbody fusion at C6-7 with anterior fixation Electronically Signed   By: Inez Catalina M.D.   On: 06/03/2020 20:21    Microbiology: Recent Results (from the past 240 hour(s))  Respiratory Panel by RT PCR (Flu A&B, Covid) - Nasopharyngeal Swab     Status: None   Collection Time: 06/03/20  3:20 AM   Specimen: Nasopharyngeal Swab  Result Value Ref Range Status   SARS Coronavirus 2 by RT PCR NEGATIVE NEGATIVE Final    Comment: (NOTE) SARS-CoV-2 target nucleic acids are NOT DETECTED.  The SARS-CoV-2 RNA is generally detectable in upper respiratoy specimens during the acute phase of infection. The lowest concentration of SARS-CoV-2 viral copies this assay can detect is 131 copies/mL. A negative result does not preclude SARS-Cov-2 infection and should not be used as the sole basis for treatment or other patient management decisions. A negative result may occur with  improper specimen collection/handling, submission of specimen other than nasopharyngeal  swab, presence of viral mutation(s) within the areas targeted by this assay, and inadequate number of viral copies (<131 copies/mL). A negative result must be combined with clinical observations, patient history, and epidemiological information. The expected result is Negative.  Fact Sheet for Patients:  PinkCheek.be  Fact Sheet for Healthcare Providers:  GravelBags.it  This test is no t yet approved or cleared by the [place] FDA and  has been authorized for detection and/or diagnosis of SARS-CoV-2 by FDA under an Emergency Use Authorization (EUA). This EUA will remain  in effect (meaning this test can be used) for the duration of the COVID-19 declaration under Section 564(b)(1) of the Act, 21 U.S.C. section 360bbb-3(b)(1), unless the authorization is terminated or revoked sooner.     Influenza A by PCR NEGATIVE NEGATIVE Final   Influenza B by PCR NEGATIVE NEGATIVE Final    Comment: (NOTE) The Xpert Xpress SARS-CoV-2/FLU/RSV assay is intended as an aid in  the diagnosis of influenza from Nasopharyngeal swab specimens and  should not be used as a sole basis for treatment. Nasal washings and  aspirates are unacceptable for Xpert Xpress SARS-CoV-2/FLU/RSV  testing.  Fact Sheet for Patients: PinkCheek.be  Fact Sheet for Healthcare Providers: GravelBags.it  This test is not yet approved or cleared by the [place] FDA and  has been authorized for detection and/or diagnosis of SARS-CoV-2 by  FDA under an Emergency Use Authorization (EUA). This EUA will remain  in effect (meaning this test can be used) for the duration of the  Covid-19 declaration under Section 564(b)(1) of the Act, 21  U.S.C. section 360bbb-3(b)(1), unless the authorization is  terminated or revoked. Performed at [facility], [address] [place], [place] [address]       Labs: Basic Metabolic Panel: Recent Labs  Lab 06/03/20 0320 06/03/20 0320 06/03/20 2227 06/05/20 1519 06/06/20 0426 06/06/20 1245 06/09/20 0659  NA 139  --  138 139 144  --  142  K 3.4*  --  3.8 3.8 4.0  --  3.9  CL 105  --  105 104 104  --  105  CO2 20*  --  19* 23 27  --  26  GLUCOSE 148*   < > 151* 219* 152* 394* 104*  BUN 27*  --  15 23 23   --  22  CREATININE 1.17  --  0.85 0.92 0.96  --  0.88  CALCIUM 9.0  --  8.2* 8.7* 8.7*  --  8.8*   < > = values in this interval not displayed.   Liver Function Tests: Recent Labs  Lab 06/05/20 1519  AST 26  ALT 18  ALKPHOS 62  BILITOT 1.3*  PROT 6.7  ALBUMIN 3.6   No results for input(s): LIPASE, AMYLASE in the last 168 hours. No results for input(s): AMMONIA in the last 168 hours. CBC: Recent Labs  Lab 06/03/20 0320 06/03/20 2227 06/05/20 1519  06/06/20 0425 06/09/20 0659  WBC 12.1* 11.1* 16.6* 13.0* 8.7  NEUTROABS 10.4*  --  15.3*  --   --   HGB 14.7 14.1 14.5 14.1 14.1  HCT 43.9 42.2 42.1 42.4 42.7  MCV 87.8 88.1 85.1 87.6 87.9  PLT 138* 158 166 169 158   Cardiac Enzymes: No results for input(s): CKTOTAL, CKMB, CKMBINDEX, TROPONINI in the last 168 hours. BNP: BNP (last 3 results) No results for input(s): BNP in the last 8760 hours.  ProBNP (last 3 results) No results for input(s): PROBNP in the last 8760 hours.  CBG: Recent Labs  Lab 06/08/20 1145 06/08/20 1645 06/08/20 2030 06/09/20 0739 06/09/20 1147  GLUCAP 258* 152* 100* 101* 231*       Signed:  Kayleen Memos, MD [facility] 06/09/2020, 2:01 PM

## 2020-06-09 NOTE — Progress Notes (Signed)
Physical Therapy Treatment Patient Details Name: Mitchell Stewart MRN: [id number] DOB: Nov 28, 1940 Today's Date: 06/09/2020    History of Present Illness Pt is a 79 y/o M with PMH: dementia, DM, HOH, HLD and prostate cancer who presented to [facility] ED s/p fall down flight of stairs. Pt found to have cervical spine fx and is now s/p C6-7 ACDF (06/03/2020).    PT Comments    Pt in bed, had removed gown and blankets, thrown on floor and loosened cervical collar.  Gown donned and collar adjusted to proper fit for mobility.  He is incontinent of urine in bed.  Assisted to sitting with max a x 1  While he reaches for rail, he is unable to initiate transition to sitting and requires max a x 1. Once sitting, he is able to sit unsupported but with supervision for safety.  He is able to stand with mod a x 1 and cues to initiate and for hand placements.  Once standing he requires mod a x 1 for balance and mod/max a x 1 for transfer to chair with RW.  He has significant post lean and makes no attempt to correct making progression of gait unsafe.  He remains in recliner with nurse tech in room to assist with breakfast.   Follow Up Recommendations  SNF     Equipment Recommendations  Other (comment) (defer to next level of care)    Recommendations for Other Services       Precautions / Restrictions Precautions Precautions: Fall;Cervical Precaution Comments: Brace on at all times (can be applied seated EOB per order) Required Braces or Orthoses: Cervical Brace Cervical Brace: Hard collar Restrictions Other Position/Activity Restrictions: [place] J collar, HOB 30 degrees, okay per MD to take brace off temporaily to adjust brace    Mobility  Bed Mobility Overal bed mobility: Needs Assistance Bed Mobility: Supine to Sit   Sidelying to sit: Max assist          Transfers Overall transfer level: Needs assistance Equipment used: Rolling walker (2 wheeled) Transfers: Sit to/from Stand Sit to Stand:  Mod assist            Ambulation/Gait Ambulation/Gait assistance: Mod assist;Max assist Gait Distance (Feet): 3 Feet Assistive device: Rolling walker (2 wheeled) Gait Pattern/deviations: Narrow base of support Gait velocity: decreased   General Gait Details: post lean and overall poor control and correction,  high fall risk   Stairs             Wheelchair Mobility    Modified Rankin (Stroke Patients Only)       Balance Overall balance assessment: Needs assistance Sitting-balance support: Feet supported;Bilateral upper extremity supported Sitting balance-Leahy Scale: Fair     Standing balance support: Bilateral upper extremity supported;During functional activity Standing balance-Leahy Scale: Poor Standing balance comment: Reliant on BUE support + mod assist , post lean and no attempt to correct                            Cognition Arousal/Alertness: Awake/alert Behavior During Therapy: WFL for tasks assessed/performed Overall Cognitive Status: History of cognitive impairments - at baseline                                        Exercises      General Comments        Pertinent Vitals/Pain  Pain Assessment: No/denies pain    Home Living                      Prior Function            PT Goals (current goals can now be found in the care plan section) Progress towards PT goals: Progressing toward goals    Frequency    BID      PT Plan Current plan remains appropriate    Co-evaluation              AM-PAC PT "6 Clicks" Mobility   Outcome Measure  Help needed turning from your back to your side while in a flat bed without using bedrails?: A Lot Help needed moving from lying on your back to sitting on the side of a flat bed without using bedrails?: A Lot Help needed moving to and from a bed to a chair (including a wheelchair)?: A Lot Help needed standing up from a chair using your arms (e.g.,  wheelchair or bedside chair)?: A Lot Help needed to walk in hospital room?: A Lot Help needed climbing 3-5 steps with a railing? : Total 6 Click Score: 11    End of Session Equipment Utilized During Treatment: Gait belt Activity Tolerance: Patient tolerated treatment well Patient left: in bed;with call bell/phone within reach;with bed alarm set;with nursing/sitter in room Nurse Communication: Mobility status PT Visit Diagnosis: Muscle weakness (generalized) (M62.81);Unsteadiness on feet (R26.81);History of falling (Z91.81)     Time: 7782-4235 PT Time Calculation (min) (ACUTE ONLY): 23 min  Charges:  $Therapeutic Activity: 23-37 mins                    Chesley Noon, PTA 06/09/20, 9:56 AM

## 2020-06-10 DIAGNOSIS — S129XXA Fracture of neck, unspecified, initial encounter: Secondary | ICD-10-CM | POA: Diagnosis not present

## 2020-06-10 LAB — GLUCOSE, CAPILLARY
Glucose-Capillary: 125 mg/dL — ABNORMAL HIGH (ref 70–99)
Glucose-Capillary: 216 mg/dL — ABNORMAL HIGH (ref 70–99)
Glucose-Capillary: 109 mg/dL — ABNORMAL HIGH (ref 70–99)
Glucose-Capillary: 161 mg/dL — ABNORMAL HIGH (ref 70–99)

## 2020-06-10 LAB — CREATININE, SERUM
Creatinine, Ser: 0.95 mg/dL (ref 0.61–1.24)
GFR, Estimated: >60 mL/min (ref >60)

## 2020-06-10 NOTE — Progress Notes (Signed)
Discharge Summary  Mitchell Stewart:[id number] DOB: 09-05-1940  PCP: Mitchell Hausen, PA  Admit date: 06/03/2020 Discharge date: 06/10/2020  Time spent: 35 minutes  Recommendations for Outpatient Follow-up:  1. Follow up with neurosurgery 2. Follow up with your PCP 3. Take your medications as prescribed 4. Continue PT OT with assistance and fall precautions   Discharge Diagnoses:  Active Hospital Problems   Diagnosis Date Noted  . Traumatic fracture of cervical spine ([place]) 06/03/2020    Resolved Hospital Problems  No resolved problems to display.    Discharge Condition: Stable   Diet recommendation: Resume previous diet   Vitals:   06/10/20 0730 06/10/20 0805  BP: 140/87 134/84  Pulse: 73 75  Resp: 16   Temp: 98.2 F (36.8 C) 98 F (36.7 C)  SpO2: 97% 100%    History of present illness:  DavidDunlapis Oswald.Stewart y.o.malewith a known history of type 2 diabetes mellitus, dyslipidemia, prostate cancer, dementia and chronic depression, presented to [facility] ED with acute onset of accidental mechanical fall after getting up to go to the bathroom and falling down a flight of 15 stairs landing on his back on concrete with subsequent neck pain as well as bilateral elbow pain. Patient has significant dementia and unable to provide a history or review of systems.  CT cervical spine wo contrast done on 06/03/20 prior to neurosurgery showed:  Acute fracture of the cervical spine predominantly involving the posterior elements of C6 with fracture bilateral pedicles and minimal anterolisthesis of C6 upon C7. The fracture pattern is suggestive of a hyperextension injury. Fractures also are identified involving the spinous process of C6, the left lamina of C6, and the left C7 superior articular facet extending into the left C6-7 facet which demonstrates mild relative widening.  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 Superimposed small epidural hematoma at the level of the  fracture and at C5-6 slightly Superiorly.  CT head showed: No acute intracranial injury.  No calvarial fracture.  Seen by neurosurgery on 06/03/20 and was taken to the OR.  He had the following procedures: Procedure(s): ARTHRODESIS, ANTERIOR INTERBODY, INCLUDING DISC SPACE PREPARATION, DISCECTOMY, OSTEOPHYTECTOMY AND DECOMPRESSION OF SPINAL CORD AND/ORNERVE ROOTS; CERVICAL BELOW C2--C6-7ACDF ARTHRODESIS ANTERIOR INTERBODY W/DISCECTOMY ADDITIONAL FIFTH ANTERIOR INSTRUMENTATION; 2 TO 3 VERTEBRAL SEGMENTS (LIST IN ADDITION TO PRIMARY PROCEDURE) ALLOGRAFT, STRUCTURAL, FOR SPINE SURGERY ONLY (LIST IN ADDITION TO PRIMARY PROCEDURE)   SURGEON: Surgeon(s) and Role: * Malen Gauze, MD - Primary  Christine Zdeb,PA, Asisstant  ANESTHESIA:General   OPERATIVE FINDINGS:Fracture at C6-7     06/10/20: Seen and examined at his bedside.  No acute events overnight.  Patient is pleasantly confused.  He has no new complaints at this time. Awaiting bed placement.   Hospital Course:  Active Problems:   Traumatic fracture of cervical spine (HCC)  Acute C6-C7 traumatic spine fracture secondary to mechanical fall post repair by neurosurgery on 06/03/20. -Continueimmobilizing his C-spine with neck collar until seen by neurosurgery outpatient. -C-spine MRI -- unstable C6-7 fracture, was taken to the OR as stated above. -Pain management with PO pain meds -Neurosurgery consultation with Dr. Deetta Perla -POD #27fixation of C6/7 fracture  -PT OT recommended SNF and 24 hours assistance --[facility] assisting with SNF placement.  Type II diabetesmellitus with hyperglycemia --A1C 7.6 on 06/03/20 -Increase Lantus dose to 7 units twice daily Increase insulin sliding scale to moderate CBG improved with increased subcu insulin doses.  Dyslipidemia. -continue statin therapy.  Dementia without behavioral disturbance. No behavioral issues since admitted. Continue  home  Seroquel  Chronic depression Stable Continue home Zoloft  BPH. -continue Flomax. Monitor urine output  History of prostate cancer Not on home cancer medications   DVT prophylaxis. -Lovenox, subcu daily.  Resolved RUQ pain unclear etiology UA negative for pyuria.   CODE STATUS: Full code    Procedures:  C6-C7 cervical spine fracture repair  Consultations:  Neurosurgery  Discharge Exam:  BP 134/84 (BP Location: Left Arm)   Pulse 75   Temp 98 F (36.7 C) (Oral)   Resp 16   Ht 5\' 6"  (1.676 m)   Wt 56.7 kg   SpO2 100%   BMI 20.18 kg/m  . General: 79 y.o. year-old male well-developed well-nourished in no distress.  Pleasantly confused.  Neck collar in Place. . Cardiovascular: Regular rate and rhythm no rubs or gallops. Marland Kitchen Respiratory: Clear to auscultation no wheezes or rales.   . Abdomen: Soft nontender normal bowel sounds present.   . Musculoskeletal: No Lower extremity edema bilaterally.   Marland Kitchen Psychiatry: Mood is appropriate for condition and setting.  Discharge Instructions You were cared for by a hospitalist during your hospital stay. If you have any questions about your discharge medications or the care you received while you were in the hospital after you are discharged, you can call the unit and asked to speak with the hospitalist on call if the hospitalist that took care of you is not available. Once you are discharged, your primary care physician will handle any further medical issues. Please note that NO REFILLS for any discharge medications will be authorized once you are discharged, as it is imperative that you return to your primary care physician (or establish a relationship with a primary care physician if you do not have one) for your aftercare needs so that they can reassess your need for medications and monitor your lab values.   Allergies as of 06/10/2020   No Known Allergies     Medication List    TAKE these medications   Basaglar  KwikPen 100 UNIT/ML Inject 10 Units into the skin daily.   loratadine 10 MG tablet Commonly known as: CLARITIN Take 10 mg by mouth daily.   polyethylene glycol 17 g packet Commonly known as: MiraLax Take 17 g by mouth daily as needed.   pravastatin 20 MG tablet Commonly known as: PRAVACHOL Take 1 tablet (20 mg total) by mouth daily at 6 PM.   QUEtiapine 25 MG tablet Commonly known as: SEROQUEL Take 25 mg by mouth daily at 4 PM.   sertraline 100 MG tablet Commonly known as: ZOLOFT Take 1.5 tablets by mouth daily.   tamsulosin 0.4 MG Caps capsule Commonly known as: FLOMAX Take 0.4 mg by mouth daily.   traMADol 50 MG tablet Commonly known as: ULTRAM Take 1 tablet (50 mg total) by mouth every 12 (twelve) hours as needed for up to 3 days for severe pain.            Durable Medical Equipment  (From admission, onward)         Start     Ordered   06/06/20 0604  For home use only DME Walker rolling  Once       Question Answer Comment  Walker: With 5 Inch Wheels   Patient needs a walker to treat with the following condition Ambulatory dysfunction      06/06/20 0604         No Known Allergies  Follow-up Information    [place], [place], [place]. Call in  1 day(s).   Specialty: Internal Medicine Why: Please call for a post hospital follow up appointment Contact information: [address] [place] [address] [phone]        Deetta Perla, MD. Call in 1 day(s).   Specialty: Neurosurgery Why: Please call for a post hospital follow up appointment Contact information: [place] [address] [phone]                The results of significant diagnostics from this hospitalization (including imaging, microbiology, ancillary and laboratory) are listed below for reference.    Significant Diagnostic Studies: DG Cervical Spine 2 or 3 views  Result Date: 06/04/2020 CLINICAL DATA:  Status post cervical fusion. EXAM: CERVICAL  SPINE - 2-3 VIEW COMPARISON:  June 03, 2020. FINDINGS: Status post surgical anterior fusion of C6-7. Severe degenerative disc disease of C5-6 is noted. IMPRESSION: Status post surgical anterior fusion of C6-7. Electronically Signed   By: Marijo Conception M.D.   On: 06/04/2020 14:29   DG Cervical Spine 2 or 3 views  Result Date: 06/03/2020 CLINICAL DATA:  Cervical fusion EXAM: CERVICAL SPINE - 2-3 VIEW; DG C-ARM 1-60 MIN COMPARISON:  MRI from earlier in the same day. FLUOROSCOPY TIME:  Radiation Exposure Index (as provided by the fluoroscopic device): Not available If the device does not provide the exposure index: Fluoroscopy Time: 6 seconds Number of Acquired Images:  2 FINDINGS: Initial image demonstrates a surgical instrument anterior to the cervical spine at the C6-7 level. Mild anterolisthesis of C6 on C7 is seen. Subsequent interbody fusion and anterior fixation is noted at C6-7. IMPRESSION: Interbody fusion at C6-7 with anterior fixation Electronically Signed   By: Inez Catalina M.D.   On: 06/03/2020 20:21   DG Shoulder Right  Result Date: 06/03/2020 CLINICAL DATA:  Pain status post fall EXAM: RIGHT SHOULDER - 2+ VIEW COMPARISON:  None. FINDINGS: There is no evidence of fracture or dislocation. There is no evidence of arthropathy or other focal bone abnormality. Soft tissues are unremarkable. IMPRESSION: Negative. Electronically Signed   By: Constance Holster M.D.   On: 06/03/2020 02:54   DG Elbow Complete Left  Result Date: 06/03/2020 CLINICAL DATA:  Pain EXAM: LEFT ELBOW - COMPLETE 3+ VIEW COMPARISON:  None. FINDINGS: There is soft tissue swelling about the elbow. Degenerative changes are noted. There is no joint effusion. IMPRESSION: Soft tissue swelling without evidence of an acute displaced fracture. Electronically Signed   By: Constance Holster M.D.   On: 06/03/2020 02:57   DG Elbow Complete Right  Result Date: 06/03/2020 CLINICAL DATA:  Pain EXAM: RIGHT ELBOW - COMPLETE 3+ VIEW  COMPARISON:  None. FINDINGS: There is no evidence of fracture, dislocation, or joint effusion. There is no evidence of arthropathy or other focal bone abnormality. Soft tissues are unremarkable. IMPRESSION: Negative. Electronically Signed   By: Constance Holster M.D.   On: 06/03/2020 02:58   DG Abdomen 1 View  Result Date: 06/03/2020 CLINICAL DATA:  Foreign body evaluation for MRI. EXAM: ABDOMEN - 1 VIEW COMPARISON:  None. FINDINGS: No radiopaque foreign body is identified. Gas is present in nondilated loops of small and large bowel without evidence of obstruction. No acute osseous abnormality is seen. IMPRESSION: No radiopaque foreign body identified. Electronically Signed   By: Logan Bores M.D.   On: 06/03/2020 04:33   CT HEAD WO CONTRAST  Result Date: 06/03/2020 CLINICAL DATA:  79 year old male with altered mental status. Status post fall down stairs with  acute lower cervical spine fracture with dorsal hematoma in the spinal canal contributing to moderate spinal stenosis at C6-C7. Multilevel degenerative spinal stenosis. Postoperative day zero ACDF. EXAM: CT HEAD WITHOUT CONTRAST TECHNIQUE: Contiguous axial images were obtained from the base of the skull through the vertex without intravenous contrast. COMPARISON:  Head CT 0150 hours today. FINDINGS: Brain: No midline shift, mass effect, or evidence of intracranial mass lesion. No ventriculomegaly. No acute intracranial hemorrhage identified. Widely scattered and confluent bilateral cerebral white matter hypodensity appears stable from earlier today. No areas of definite cerebral edema. Vascular: Calcified atherosclerosis at the skull base. Intracranial vascular density appears to be stable from earlier today. No changes of acute cortically based infarct are identified. Skull: No skull fracture identified. Sinuses/Orbits: Stable left maxillary mucous retention cyst. Other visualized paranasal sinuses and mastoids are stable and well pneumatized.  Other: Stable orbit and scalp soft tissues. IMPRESSION: Stable non contrast CT appearance of the brain from 0150 hours today. Advanced bilateral white matter disease with no acute intracranial abnormality identified. Electronically Signed   By: Genevie Ann M.D.   On: 06/03/2020 21:56   CT Head Wo Contrast  Result Date: 06/03/2020 CLINICAL DATA:  Fall downstairs, head injury, headache EXAM: CT HEAD WITHOUT CONTRAST CT CERVICAL SPINE WITHOUT CONTRAST TECHNIQUE: Multidetector CT imaging of the head and cervical spine was performed following the standard protocol without intravenous contrast. Multiplanar CT image reconstructions of the cervical spine were also generated. COMPARISON:  None. FINDINGS: CT HEAD FINDINGS Brain: Normal anatomic configuration of the brain. Mild parenchymal volume loss is commensurate with the patient's age. There are moderate to severe periventricular and subcortical white matter changes present likely reflecting the sequela of small vessel ischemia. 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 No acute intracranial hemorrhage or infarct. No abnormal mass effect or midline shift. No abnormal intra or extra-axial mass lesion or fluid collection. Ventricular size is normal. Cerebellum is unremarkable. Vascular: No asymmetric hyperdense vasculature at the skull base. Skull: Intact Sinuses/Orbits: The paranasal sinuses are clear. The orbits are unremarkable. Other: Mastoid air cells and middle ear cavities are clear. CT CERVICAL SPINE FINDINGS Alignment: There is 2 mm anterolisthesis of C6 upon C7, likely posttraumatic in nature. Minimal anterolisthesis of C4 upon C5 is likely physiologic. Skull base and vertebrae: The craniocervical junction is unremarkable. Atlantal dental interval is normal. There are acute fractures involving the left lamina and spinous process of C6. There is a fracture involving the left pedicle of C6 as well as the the superior articular facet of C7  extending into the left C6-7 facet with mild joint space widening noted. Finally, a fracture extends through the pedicle of C6 on the right. Soft tissues and spinal canal: A small epidural hematoma measuring 3 mm x 10 mm is seen superficial to the C6 laminar fracture demonstrating minimal effacement of the canal a smaller epidural hematoma is seen superiorly posterior to the thecal sac at C5-6. No paraspinal fluid collection is identified. There is subcutaneous infiltration within the posterior soft tissues superficial to the spinous processes likely representing edema. Disc levels: Review of the sagittal reformats demonstrates preservation of vertebral body height. There is intervertebral disc space narrowing and endplate remodeling at Z6-X0 in keeping with changes of moderate degenerative disc disease. 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 Review of the axial images confirms the above-mentioned fractures. There is multilevel uncovertebral and facet arthrosis resulting in multilevel mild neural foraminal narrowing. There is mild diffuse congenital narrowing of the  spinal canal. Upper chest: Unremarkable Other: None significant IMPRESSION: No acute intracranial injury.  No calvarial fracture. Acute fracture of the cervical spine predominantly involving the posterior elements of C6 with fracture bilateral pedicles and minimal anterolisthesis of C6 upon C7. The fracture pattern is suggestive of a hyperextension injury. Fractures also are identified involving the spinous process of C6, the left lamina of C6, and the left C7 superior articular facet extending into the left C6-7 facet which demonstrates mild relative widening. 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 Superimposed small epidural hematoma at the level of the fracture and at C5-6 slightly superiorly. These results will be called to the ordering clinician or representative by the Radiologist Assistant, and communication documented in  the PACS or [employer]. Electronically Signed   By: Fidela Salisbury MD   On: 06/03/2020 02:26   CT CERVICAL SPINE WO CONTRAST  Result Date: 06/03/2020 CLINICAL DATA:  79 year old male with altered mental status. Status post fall down stairs with acute lower cervical spine fracture with dorsal hematoma in the spinal canal contributing to moderate spinal stenosis at C6-C7. Multilevel degenerative spinal stenosis. Postoperative day zero ACDF. EXAM: CT CERVICAL SPINE WITHOUT CONTRAST TECHNIQUE: Multidetector CT imaging of the cervical spine was performed without intravenous contrast. Multiplanar CT image reconstructions were also generated. COMPARISON:  Cervical spine CT 0150 hours today. Preoperative cervical spine MRI 0503 hours today. FINDINGS: Alignment: Mildly improved cervical lordosis from earlier today. Subtle anterolisthesis of C4 on C5 is stable and appears to be degenerative in nature. Mild posttraumatic anterolisthesis of C6 on C7 appears stable. See additional details below. Skull base and vertebrae: Bilateral C6 pedicle fractures remain nondisplaced. There is less distraction of the C6 lamina fractures now. A nondisplaced fracture through the left C7 superior articulating facet and lamina is stable (sagittal image 41 now). Postoperative details are below. No new osseous abnormality identified. Stable subchondral cyst at the left odontoid. Visualized skull base is intact. No atlanto-occipital dissociation. Soft tissues and spinal canal: Ventral and posterior increased intraspinal density (sagittal image 30 with soft tissue windows) appear stable from the CT earlier this morning and more resembles ligamentous hypertrophy than intraspinal hemorrhage on the MRI. Small volume of prevertebral and small to moderate volume left lateral neck soft tissue gas is related to interval ACDF. No postoperative fluid collection is evident. Disc levels: Stable CT appearance of the level C2-C3 through C4-C5.  Including moderate to severe multifactorial spinal stenosis at C3-C4 (series 3, image 38). C5-C6: Stable disc space loss, circumferential disc osteophyte complex. Mild spinal stenosis, severe left and moderate right C6 foraminal stenosis at this level appears stable from the preoperative MRI. C6-C7: Interval ACDF. Hardware appears intact with no adverse features. Small volume of posterior dural or epidural hemorrhage here appears not significantly changed (series 3, image 56), about 2 mm in thickness. No CT evidence of increased spinal stenosis. And C7 foraminal patency especially on the left appears improved. C7-T1:  Stable, relatively negative. Upper chest: Visible upper thoracic levels remain grossly intact. Mild apical lung scarring on the left is stable. IMPRESSION: 1. Interval ACDF with no adverse hardware features. Bilateral C6 posterior element and left C7 superior articulating facet fractures now are largely nondisplaced. A small volume of dorsal intraspinal hemorrhage eccentric to the left appears stable. No increased spinal stenosis here evident by CT, and C7 foraminal patency appears improved especially on the left. 2. Degenerative lumbar spinal stenosis elsewhere appears stable, and is moderate to severe at C3-C4 as previously noted. 3.  No new osseous abnormality identified. Small volume postoperative gas in the neck. Electronically Signed   By: Genevie Ann M.D.   On: 06/03/2020 22:11   CT Cervical Spine Wo Contrast  Result Date: 06/03/2020 CLINICAL DATA:  Fall downstairs, head injury, headache EXAM: CT HEAD WITHOUT CONTRAST CT CERVICAL SPINE WITHOUT CONTRAST TECHNIQUE: Multidetector CT imaging of the head and cervical spine was performed following the standard protocol without intravenous contrast. Multiplanar CT image reconstructions of the cervical spine were also generated. COMPARISON:  None. FINDINGS: CT HEAD FINDINGS Brain: Normal anatomic configuration of the brain. Mild parenchymal volume loss  is commensurate with the patient's age. There are moderate to severe periventricular and subcortical white matter changes present likely reflecting the sequela of small vessel ischemia. 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 No acute intracranial hemorrhage or infarct. No abnormal mass effect or midline shift. No abnormal intra or extra-axial mass lesion or fluid collection. Ventricular size is normal. Cerebellum is unremarkable. Vascular: No asymmetric hyperdense vasculature at the skull base. Skull: Intact Sinuses/Orbits: The paranasal sinuses are clear. The orbits are unremarkable. Other: Mastoid air cells and middle ear cavities are clear. CT CERVICAL SPINE FINDINGS Alignment: There is 2 mm anterolisthesis of C6 upon C7, likely posttraumatic in nature. Minimal anterolisthesis of C4 upon C5 is likely physiologic. Skull base and vertebrae: The craniocervical junction is unremarkable. Atlantal dental interval is normal. There are acute fractures involving the left lamina and spinous process of C6. There is a fracture involving the left pedicle of C6 as well as the the superior articular facet of C7 extending into the left C6-7 facet with mild joint space widening noted. Finally, a fracture extends through the pedicle of C6 on the right. Soft tissues and spinal canal: A small epidural hematoma measuring 3 mm x 10 mm is seen superficial to the C6 laminar fracture demonstrating minimal effacement of the canal a smaller epidural hematoma is seen superiorly posterior to the thecal sac at C5-6. No paraspinal fluid collection is identified. There is subcutaneous infiltration within the posterior soft tissues superficial to the spinous processes likely representing edema. Disc levels: Review of the sagittal reformats demonstrates preservation of vertebral body height. There is intervertebral disc space narrowing and endplate remodeling at G9-F6 in keeping with changes of moderate degenerative disc  disease. 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 Review of the axial images confirms the above-mentioned fractures. There is multilevel uncovertebral and facet arthrosis resulting in multilevel mild neural foraminal narrowing. 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 Upper chest: Unremarkable Other: None significant IMPRESSION: No acute intracranial injury.  No calvarial fracture. Acute fracture of the cervical spine predominantly involving the posterior elements of C6 with fracture bilateral pedicles and minimal anterolisthesis of C6 upon C7. The fracture pattern is suggestive of a hyperextension injury. Fractures also are identified involving the spinous process of C6, the left lamina of C6, and the left C7 superior articular facet extending into the left C6-7 facet which demonstrates mild relative widening. 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 Superimposed small epidural hematoma at the level of the fracture and at C5-6 slightly superiorly. These results will be called to the ordering clinician or representative by the Radiologist Assistant, and communication documented in the PACS or [employer]. Electronically Signed   By: Fidela Salisbury MD   On: 06/03/2020 02:26   MR Cervical Spine Wo Contrast  Result Date: 06/03/2020 CLINICAL DATA:  Fall with cervical spine fracture. EXAM: MRI  CERVICAL SPINE WITHOUT CONTRAST TECHNIQUE: Multiplanar, multisequence MR imaging of the cervical spine was performed. No intravenous contrast was administered. COMPARISON:  Cervical spine CT 06/03/2020 FINDINGS: Image quality is degraded by motion artifact and increased image noise due to the presence of a cervical spine collar limiting coil positioning. Alignment: Unchanged trace anterolisthesis of C6 on C7. Vertebrae: Mild posterior element edema at C6-7 related to underlying fractures, better demonstrated on CT. Chronic degenerative endplate changes at G1-8 and C6-7. Cord: No  convincing spinal cord signal abnormality is identified within study limitations. A small dorsal epidural hematoma at C6-7 measures up to 3 mm in thickness. Posterior Fossa, vertebral arteries, paraspinal tissues: Minimal prevertebral edema. Mild right and moderate left-sided posterior paraspinal soft tissue edema in the mid and lower cervical spine including in the inter spinous soft tissues at C5-6 and C6-7. Disc levels: The cervical spinal canal is diffusely narrow on a congenital basis. Detailed assessment of disc and facet degeneration is limited by motion, however there is severe multifactorial spinal stenosis at C3-4 greater than C4-5 with moderate cord flattening. Multifactorial spinal stenosis at C5-6 is moderate, and there is also moderate spinal stenosis at C6-7 in part secondary to the epidural hematoma. IMPRESSION: 1. Motion degraded examination. 2. Known posterior element fractures at C6-7 with associated mild marrow edema and moderate paraspinal soft tissue/posterior ligamentous complex edema. 3. Small dorsal epidural hematoma at C6-7 contributing to moderate spinal stenosis. 4. No convincing spinal cord signal abnormality. 5. 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 Electronically Signed   By: Logan Bores M.D.   On: 06/03/2020 06:12   DG Chest Portable 1 View  Result Date: 06/03/2020 CLINICAL DATA:  Foreign body evaluation for MRI. EXAM: PORTABLE CHEST 1 VIEW COMPARISON:  None. FINDINGS: The cardiomediastinal silhouette is within normal limits. The patient's chin projects over the right lung apex. There is slight prominence of the interstitial markings without overt pulmonary edema. No confluent airspace opacity, sizeable pleural effusion, or pneumothorax is identified. No acute osseous abnormality is seen. IMPRESSION: No radiopaque foreign body identified. Electronically Signed   By: Logan Bores M.D.   On: 06/03/2020 04:35   DG Shoulder Left  Result Date:  06/03/2020 CLINICAL DATA:  Pain EXAM: LEFT SHOULDER - 2+ VIEW COMPARISON:  None. FINDINGS: There is no evidence of fracture or dislocation. There is no evidence of arthropathy or other focal bone abnormality. Soft tissues are unremarkable. IMPRESSION: Negative. Electronically Signed   By: Constance Holster M.D.   On: 06/03/2020 02:55   DG C-Arm 1-60 Min  Result Date: 06/03/2020 CLINICAL DATA:  Cervical fusion EXAM: CERVICAL SPINE - 2-3 VIEW; DG C-ARM 1-60 MIN COMPARISON:  MRI from earlier in the same day. FLUOROSCOPY TIME:  Radiation Exposure Index (as provided by the fluoroscopic device): Not available If the device does not provide the exposure index: Fluoroscopy Time: 6 seconds Number of Acquired Images:  2 FINDINGS: Initial image demonstrates a surgical instrument anterior to the cervical spine at the C6-7 level. Mild anterolisthesis of C6 on C7 is seen. Subsequent interbody fusion and anterior fixation is noted at C6-7. IMPRESSION: Interbody fusion at C6-7 with anterior fixation Electronically Signed   By: Inez Catalina M.D.   On: 06/03/2020 20:21    Microbiology: Recent Results (from the past 240 hour(s))  Respiratory Panel by RT PCR (Flu A&B, Covid) - Nasopharyngeal Swab     Status: None   Collection Time: 06/03/20  3:20 AM   Specimen: Nasopharyngeal Swab  Result Value Ref  Range Status   SARS Coronavirus 2 by RT PCR NEGATIVE NEGATIVE Final    Comment: (NOTE) SARS-CoV-2 target nucleic acids are NOT DETECTED.  The SARS-CoV-2 RNA is generally detectable in upper respiratoy specimens during the acute phase of infection. The lowest concentration of SARS-CoV-2 viral copies this assay can detect is 131 copies/mL. A negative result does not preclude SARS-Cov-2 infection and should not be used as the sole basis for treatment or other patient management decisions. A negative result may occur with  improper specimen collection/handling, submission of specimen other than nasopharyngeal swab,  presence of viral mutation(s) within the areas targeted by this assay, and inadequate number of viral copies (<131 copies/mL). A negative result must be combined with clinical observations, patient history, and epidemiological information. The expected result is Negative.  Fact Sheet for Patients:  PinkCheek.be  Fact Sheet for Healthcare Providers:  GravelBags.it  This test is no t yet approved or cleared by the [place] FDA and  has been authorized for detection and/or diagnosis of SARS-CoV-2 by FDA under an Emergency Use Authorization (EUA). This EUA will remain  in effect (meaning this test can be used) for the duration of the COVID-19 declaration under Section 564(b)(1) of the Act, 21 U.S.C. section 360bbb-3(b)(1), unless the authorization is terminated or revoked sooner.     Influenza A by PCR NEGATIVE NEGATIVE Final   Influenza B by PCR NEGATIVE NEGATIVE Final    Comment: (NOTE) The Xpert Xpress SARS-CoV-2/FLU/RSV assay is intended as an aid in  the diagnosis of influenza from Nasopharyngeal swab specimens and  should not be used as a sole basis for treatment. Nasal washings and  aspirates are unacceptable for Xpert Xpress SARS-CoV-2/FLU/RSV  testing.  Fact Sheet for Patients: PinkCheek.be  Fact Sheet for Healthcare Providers: GravelBags.it  This test is not yet approved or cleared by the [place] FDA and  has been authorized for detection and/or diagnosis of SARS-CoV-2 by  FDA under an Emergency Use Authorization (EUA). This EUA will remain  in effect (meaning this test can be used) for the duration of the  Covid-19 declaration under Section 564(b)(1) of the Act, 21  U.S.C. section 360bbb-3(b)(1), unless the authorization is  terminated or revoked. Performed at [facility], [address] [place], [place] [address]       Labs: Basic Metabolic Panel: Recent Labs  Lab 06/03/20 2227 06/05/20 1519 06/06/20 0426 06/06/20 1245 06/09/20 0659  NA 138 139 144  --  142  K 3.8 3.8 4.0  --  3.9  CL 105 104 104  --  105  CO2 19* 23 27  --  26  GLUCOSE 151* 219* 152* 394* 104*  BUN 15 23 23   --  22  CREATININE 0.85 0.92 0.96  --  0.88  CALCIUM 8.2* 8.7* 8.7*  --  8.8*   Liver Function Tests: Recent Labs  Lab 06/05/20 1519  AST 26  ALT 18  ALKPHOS 62  BILITOT 1.3*  PROT 6.7  ALBUMIN 3.6   No results for input(s): LIPASE, AMYLASE in the last 168 hours. No results for input(s): AMMONIA in the last 168 hours. CBC: Recent Labs  Lab 06/03/20 2227 06/05/20 1519 06/06/20 0425 06/09/20 0659  WBC 11.1* 16.6* 13.0* 8.7  NEUTROABS  --  15.3*  --   --   HGB 14.1 14.5 14.1 14.1  HCT 42.2 42.1 42.4 42.7  MCV 88.1 85.1 87.6 87.9  PLT 158 166 169 158   Cardiac Enzymes: No results for  input(s): CKTOTAL, CKMB, CKMBINDEX, TROPONINI in the last 168 hours. BNP: BNP (last 3 results) No results for input(s): BNP in the last 8760 hours.  ProBNP (last 3 results) No results for input(s): PROBNP in the last 8760 hours.  CBG: Recent Labs  Lab 06/09/20 1147 06/09/20 1654 06/09/20 2119 06/10/20 0727 06/10/20 1037  GLUCAP 231* 96 192* 125* 216*       Signed:  Kayleen Memos, MD [facility] 06/10/2020, 3:02 PM

## 2020-06-10 NOTE — Plan of Care (Signed)
No acute events overnight.   Problem: Clinical Measurements: Goal: Ability to maintain clinical measurements within normal limits will improve Outcome: Progressing Goal: Will remain free from infection Outcome: Progressing

## 2020-06-10 NOTE — Progress Notes (Signed)
Physical Therapy Treatment Patient Details Name: Mitchell Stewart MRN: [id number] DOB: 17-Mar-1941 Today's Date: 06/10/2020    History of Present Illness Pt is a 79 y/o M with PMH: dementia, DM, HOH, HLD and prostate cancer who presented to [facility] ED s/p fall down flight of stairs. Pt found to have cervical spine fx and is now s/p C6-7 ACDF (06/03/2020).    PT Comments    Attempted this am.  Pt lethargic.  Keeps eyes closed and generally uninterested in therapy.  Returned later this pm and he remains lethargic/disinterested.  He closes eyes 1/2 way though exercises and appears to be asleep.  Initially assisting with ex but stops.  Mobility deferred today.   Follow Up Recommendations  SNF     Equipment Recommendations       Recommendations for Other Services       Precautions / Restrictions Precautions Precautions: Fall;Cervical Precaution Comments: Brace on at all times (can be applied seated EOB per order) Required Braces or Orthoses: Cervical Brace Cervical Brace: Hard collar Restrictions Other Position/Activity Restrictions: [place] J collar, HOB 30 degrees, okay per MD to take brace off temporaily to adjust brace    Mobility  Bed Mobility                  Transfers                    Ambulation/Gait                 Stairs             Wheelchair Mobility    Modified Rankin (Stroke Patients Only)       Balance                                            Cognition Arousal/Alertness: Lethargic Behavior During Therapy: WFL for tasks assessed/performed Overall Cognitive Status: History of cognitive impairments - at baseline                                        Exercises Other Exercises Other Exercises: BLE AA/PROM x 10 for supine ranges    General Comments        Pertinent Vitals/Pain Pain Assessment: No/denies pain    Home Living                      Prior Function             PT Goals (current goals can now be found in the care plan section) Progress towards PT goals: Not progressing toward goals - comment    Frequency    BID      PT Plan Current plan remains appropriate    Co-evaluation              AM-PAC PT "6 Clicks" Mobility   Outcome Measure  Help needed turning from your back to your side while in a flat bed without using bedrails?: A Lot Help needed moving from lying on your back to sitting on the side of a flat bed without using bedrails?: A Lot Help needed moving to and from a bed to a chair (including a wheelchair)?: A Lot Help needed standing up from a chair using your arms (e.g., wheelchair  or bedside chair)?: A Lot Help needed to walk in hospital room?: A Lot Help needed climbing 3-5 steps with a railing? : Total 6 Click Score: 11    End of Session Equipment Utilized During Treatment: Gait belt Activity Tolerance: Patient limited by lethargy Patient left: in bed;with call bell/phone within reach;with bed alarm set Nurse Communication: Mobility status       Time: 9179-1505 PT Time Calculation (min) (ACUTE ONLY): 12 min  Charges:  $Therapeutic Exercise: 8-22 mins                    Chesley Noon, PTA 06/10/20, 2:58 PM

## 2020-06-10 NOTE — Care Management Important Message (Signed)
Important Message  Patient Details  Name: Mitchell Stewart MRN: [id number] Date of Birth: 1940/11/09   Medicare Important Message Given:  Yes     [name] 06/10/2020, 11:35 AM

## 2020-06-11 DIAGNOSIS — S129XXA Fracture of neck, unspecified, initial encounter: Secondary | ICD-10-CM | POA: Diagnosis not present

## 2020-06-11 LAB — GLUCOSE, CAPILLARY
Glucose-Capillary: 116 mg/dL — ABNORMAL HIGH (ref 70–99)
Glucose-Capillary: 180 mg/dL — ABNORMAL HIGH (ref 70–99)
Glucose-Capillary: 168 mg/dL — ABNORMAL HIGH (ref 70–99)
Glucose-Capillary: 139 mg/dL — ABNORMAL HIGH (ref 70–99)

## 2020-06-11 NOTE — Progress Notes (Signed)
PT Cancellation Note  Patient Details Name: Mitchell Stewart MRN: [id number] DOB: 07/01/1941   Cancelled Treatment:     PT attempt 2 x this AM. Pt was alert but confused. Only oriented to self. Unable to participate in PT session this AM due to being unwilling to wear cervical collar. Pt became combative with all attempts to apply brace. PT will continue to follow per POC progressing as able.   Willette Pa 06/11/2020, 12:48 PM

## 2020-06-11 NOTE — TOC Progression Note (Signed)
Transition of Care [facility]) - Progression Note    Patient Details  Name: Mitchell Stewart MRN: [id number] Date of Birth: [birth year]  Transition of Care [facility]) CM/SW [place], RN Phone Number: 06/11/2020, 4:03 PM  Clinical Narrative:   RNCM reached out and left messages for both [facility] and [facility] admission departments to assess if they truly are in network with patient's insurance and can offer bed. Waiting return call.   RNCM spoke with patient's son [name] who was agreeable to bed search being extended further out to find placement.     Expected Discharge Plan: [place] Barriers to Discharge: No Barriers Identified  Expected Discharge Plan and Services Expected Discharge Plan: [place] Choice: [place] arrangements for the past 2 months: Single Family Home Expected Discharge Date: 06/10/20                                     Social Determinants of Health (SDOH) Interventions    Readmission Risk Interventions No flowsheet data found.

## 2020-06-11 NOTE — Progress Notes (Signed)
Discharge Summary  Mitchell Stewart TDV:[id number] DOB: November 06, 1940  PCP: Lesia Hausen, PA  Admit date: 06/03/2020 Discharge date: 06/11/2020  Time spent: 35 minutes  Recommendations for Outpatient Follow-up:  1. Follow up with neurosurgery in 1 to 2 weeks 2. Follow up with your PCP within 1 to 2 weeks 3. Take your medications as prescribed 4. Continue PT OT with assistance and fall precautions   Discharge Diagnoses:  Active Hospital Problems   Diagnosis Date Noted  . Traumatic fracture of cervical spine ([place]) 06/03/2020    Resolved Hospital Problems  No resolved problems to display.    Discharge Condition: Stable   Diet recommendation: Resume previous diet   Vitals:   06/11/20 0417 06/11/20 0700  BP: (!) 141/86 (!) 146/91  Pulse: 86 80  Resp: 16 16  Temp: 98.5 F (36.9 C) 98.6 F (37 C)  SpO2: 98% 99%    History of present illness:  [place] Mitchell.Stewart y.o.malewith a known history of type 2 diabetes mellitus, dyslipidemia, prostate cancer, dementia and chronic depression, presented to [facility] ED with acute onset of accidental mechanical fall after getting up to go to the bathroom and falling down a flight of 15 stairs landing on his back on concrete with subsequent neck pain as well as bilateral elbow pain. Patient has significant dementia and unable to provide a history or review of systems.  CT cervical spine wo contrast done on 06/03/20 prior to neurosurgery showed:  Acute fracture of the cervical spine predominantly involving the posterior elements of C6 with fracture bilateral pedicles and minimal anterolisthesis of C6 upon C7. The fracture pattern is suggestive of a hyperextension injury. Fractures also are identified involving the spinous process of C6, the left lamina of C6, and the left C7 superior articular facet extending into the left C6-7 facet which demonstrates mild relative widening.  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 Superimposed  small epidural hematoma at the level of the fracture and at C5-6 slightly Superiorly.  CT head showed: No acute intracranial injury.  No calvarial fracture.  Seen by neurosurgery on 06/03/20 and was taken to the OR.  He had the following procedures: Procedure(s): ARTHRODESIS, ANTERIOR INTERBODY, INCLUDING DISC SPACE PREPARATION, DISCECTOMY, OSTEOPHYTECTOMY AND DECOMPRESSION OF SPINAL CORD AND/ORNERVE ROOTS; CERVICAL BELOW C2--C6-7ACDF ARTHRODESIS ANTERIOR INTERBODY W/DISCECTOMY ADDITIONAL FIFTH ANTERIOR INSTRUMENTATION; 2 TO 3 VERTEBRAL SEGMENTS (LIST IN ADDITION TO PRIMARY PROCEDURE) ALLOGRAFT, STRUCTURAL, FOR SPINE SURGERY ONLY (LIST IN ADDITION TO PRIMARY PROCEDURE)   SURGEON: Surgeon(s) and Role: * Malen Gauze, MD - Primary  Christine Zdeb,PA, Asisstant  ANESTHESIA:General   OPERATIVE FINDINGS:Fracture at C6-7     06/11/20: Seen and examined at his bedside.  No acute events overnight.  He has no new complaints.  This morning he does not want to wear his cervical collar, will continue to encourage to wear it.  Awaiting bed placement.  TOC assisting with SNF placement.  Hospital Course:  Active Problems:   Traumatic fracture of cervical spine (HCC)  Acute C6-C7 traumatic spine fracture secondary to mechanical fall post repair by neurosurgery on 06/03/20. -Continueimmobilizing his C-spine with neck collar until seen by neurosurgery outpatient. -C-spine MRI -- unstable C6-7 fracture, was taken to the OR as stated above. -Pain management with PO pain meds -Neurosurgery consultation with Dr. Deetta Perla -POD #63fixation of C6/7 fracture  -PT OT recommended SNF and 24 hours assistance --[facility] assisting with SNF placement.  Type II diabetesmellitus with hyperglycemia --A1C 7.6 on 06/03/20 -Increase Lantus dose to 7 units twice daily  Increase insulin sliding scale to moderate CBG improved with increased subcu insulin  doses.  Dyslipidemia. -continue statin therapy.  Dementia without behavioral disturbance. No behavioral issues since admitted. Continue home Seroquel  Chronic depression Stable Continue home Zoloft  BPH. -continue Flomax. Monitor urine output  History of prostate cancer Not on home cancer medications   DVT prophylaxis. -Lovenox, subcu daily.  Resolved RUQ pain unclear etiology UA negative for pyuria.   CODE STATUS: Full code    Procedures:  C6-C7 cervical spine fracture repair  Consultations:  Neurosurgery  Discharge Exam: No significant changes from previous exam. BP (!) 146/91 (BP Location: Left Arm)   Pulse 80   Temp 98.6 F (37 C) (Axillary)   Resp 16   Ht 5\' 6"  (1.676 m)   Wt 56.7 kg   SpO2 99%   BMI 20.18 kg/m  . General: 79 y.o. year-old male well-developed well-nourished in no distress.  Pleasantly confused.  Neck collar in Place. . Cardiovascular: Regular rate and rhythm no rubs or gallops. Marland Kitchen Respiratory: Clear to auscultation no wheezes or rales.   . Abdomen: Soft nontender normal bowel sounds present.   . Musculoskeletal: No Lower extremity edema bilaterally.   Marland Kitchen Psychiatry: Mood is appropriate for condition and setting.  Discharge Instructions You were cared for by a hospitalist during your hospital stay. If you have any questions about your discharge medications or the care you received while you were in the hospital after you are discharged, you can call the unit and asked to speak with the hospitalist on call if the hospitalist that took care of you is not available. Once you are discharged, your primary care physician will handle any further medical issues. Please note that NO REFILLS for any discharge medications will be authorized once you are discharged, as it is imperative that you return to your primary care physician (or establish a relationship with a primary care physician if you do not have one) for your aftercare needs so  that they can reassess your need for medications and monitor your lab values.   Allergies as of 06/11/2020   No Known Allergies     Medication List    TAKE these medications   Basaglar KwikPen 100 UNIT/ML Inject 10 Units into the skin daily.   loratadine 10 MG tablet Commonly known as: CLARITIN Take 10 mg by mouth daily.   polyethylene glycol 17 g packet Commonly known as: MiraLax Take 17 g by mouth daily as needed.   pravastatin 20 MG tablet Commonly known as: PRAVACHOL Take 1 tablet (20 mg total) by mouth daily at 6 PM.   QUEtiapine 25 MG tablet Commonly known as: SEROQUEL Take 25 mg by mouth daily at 4 PM.   sertraline 100 MG tablet Commonly known as: ZOLOFT Take 1.5 tablets by mouth daily.   tamsulosin 0.4 MG Caps capsule Commonly known as: FLOMAX Take 0.4 mg by mouth daily.     ASK your doctor about these medications   traMADol 50 MG tablet Commonly known as: ULTRAM Take 1 tablet (50 mg total) by mouth every 12 (twelve) hours as needed for up to 3 days for severe pain. Ask about: Should I take this medication?            Durable Medical Equipment  (From admission, onward)         Start     Ordered   06/06/20 0604  For home use only DME Walker rolling  Once  Question Answer Comment  Walker: With [place]   Patient needs a walker to treat with the following condition Ambulatory dysfunction      06/06/20 0604         No Known Allergies  Follow-up Information    Thaniel, [place], [place]. Call in 1 day(s).   Specialty: Internal Medicine Why: Please call for a post hospital follow up appointment Contact information: [address] [place] [address] [phone]        Deetta Perla, MD. Call on 06/18/2020.   Specialty: Neurosurgery Why: Please call for a post hospital follow up appointment AT 1145 Contact information: [address] [place] [address] [phone]                The results of  significant diagnostics from this hospitalization (including imaging, microbiology, ancillary and laboratory) are listed below for reference.    Significant Diagnostic Studies: DG Cervical Spine 2 or 3 views  Result Date: 06/04/2020 CLINICAL DATA:  Status post cervical fusion. EXAM: CERVICAL SPINE - 2-3 VIEW COMPARISON:  June 03, 2020. FINDINGS: Status post surgical anterior fusion of C6-7. Severe degenerative disc disease of C5-6 is noted. IMPRESSION: Status post surgical anterior fusion of C6-7. Electronically Signed   By: Marijo Conception M.D.   On: 06/04/2020 14:29   DG Cervical Spine 2 or 3 views  Result Date: 06/03/2020 CLINICAL DATA:  Cervical fusion EXAM: CERVICAL SPINE - 2-3 VIEW; DG C-ARM 1-60 MIN COMPARISON:  MRI from earlier in the same day. FLUOROSCOPY TIME:  Radiation Exposure Index (as provided by the fluoroscopic device): Not available If the device does not provide the exposure index: Fluoroscopy Time: 6 seconds Number of Acquired Images:  2 FINDINGS: Initial image demonstrates a surgical instrument anterior to the cervical spine at the C6-7 level. Mild anterolisthesis of C6 on C7 is seen. Subsequent interbody fusion and anterior fixation is noted at C6-7. IMPRESSION: Interbody fusion at C6-7 with anterior fixation Electronically Signed   By: Inez Catalina M.D.   On: 06/03/2020 20:21   DG Shoulder Right  Result Date: 06/03/2020 CLINICAL DATA:  Pain status post fall EXAM: RIGHT SHOULDER - 2+ VIEW COMPARISON:  None. FINDINGS: There is no evidence of fracture or dislocation. There is no evidence of arthropathy or other focal bone abnormality. Soft tissues are unremarkable. IMPRESSION: Negative. Electronically Signed   By: Constance Holster M.D.   On: 06/03/2020 02:54   DG Elbow Complete Left  Result Date: 06/03/2020 CLINICAL DATA:  Pain EXAM: LEFT ELBOW - COMPLETE 3+ VIEW COMPARISON:  None. FINDINGS: There is soft tissue swelling about the elbow. Degenerative changes are noted.  There is no joint effusion. IMPRESSION: Soft tissue swelling without evidence of an acute displaced fracture. Electronically Signed   By: Constance Holster M.D.   On: 06/03/2020 02:57   DG Elbow Complete Right  Result Date: 06/03/2020 CLINICAL DATA:  Pain EXAM: RIGHT ELBOW - COMPLETE 3+ VIEW COMPARISON:  None. FINDINGS: There is no evidence of fracture, dislocation, or joint effusion. There is no evidence of arthropathy or other focal bone abnormality. Soft tissues are unremarkable. IMPRESSION: Negative. Electronically Signed   By: Constance Holster M.D.   On: 06/03/2020 02:58   DG Abdomen 1 View  Result Date: 06/03/2020 CLINICAL DATA:  Foreign body evaluation for MRI. EXAM: ABDOMEN - 1 VIEW COMPARISON:  None. FINDINGS: No radiopaque foreign body is identified. Gas is present in nondilated loops of small and large bowel without evidence  of obstruction. No acute osseous abnormality is seen. IMPRESSION: No radiopaque foreign body identified. Electronically Signed   By: Logan Bores M.D.   On: 06/03/2020 04:33   CT HEAD WO CONTRAST  Result Date: 06/03/2020 CLINICAL DATA:  79 year old male with altered mental status. Status post fall down stairs with acute lower cervical spine fracture with dorsal hematoma in the spinal canal contributing to moderate spinal stenosis at C6-C7. Multilevel degenerative spinal stenosis. Postoperative day zero ACDF. EXAM: CT HEAD WITHOUT CONTRAST TECHNIQUE: Contiguous axial images were obtained from the base of the skull through the vertex without intravenous contrast. COMPARISON:  Head CT 0150 hours today. FINDINGS: Brain: No midline shift, mass effect, or evidence of intracranial mass lesion. No ventriculomegaly. No acute intracranial hemorrhage identified. Widely scattered and confluent bilateral cerebral white matter hypodensity appears stable from earlier today. No areas of definite cerebral edema. Vascular: Calcified atherosclerosis at the skull base. Intracranial  vascular density appears to be stable from earlier today. No changes of acute cortically based infarct are identified. Skull: No skull fracture identified. Sinuses/Orbits: Stable left maxillary mucous retention cyst. Other visualized paranasal sinuses and mastoids are stable and well pneumatized. Other: Stable orbit and scalp soft tissues. IMPRESSION: Stable non contrast CT appearance of the brain from 0150 hours today. Advanced bilateral white matter disease with no acute intracranial abnormality identified. Electronically Signed   By: Genevie Ann M.D.   On: 06/03/2020 21:56   CT Head Wo Contrast  Result Date: 06/03/2020 CLINICAL DATA:  Fall downstairs, head injury, headache EXAM: CT HEAD WITHOUT CONTRAST CT CERVICAL SPINE WITHOUT CONTRAST TECHNIQUE: Multidetector CT imaging of the head and cervical spine was performed following the standard protocol without intravenous contrast. Multiplanar CT image reconstructions of the cervical spine were also generated. COMPARISON:  None. FINDINGS: CT HEAD FINDINGS Brain: Normal anatomic configuration of the brain. Mild parenchymal volume loss is commensurate with the patient's age. There are moderate to severe periventricular and subcortical white matter changes present likely reflecting the sequela of small vessel ischemia. 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 No acute intracranial hemorrhage or infarct. No abnormal mass effect or midline shift. No abnormal intra or extra-axial mass lesion or fluid collection. Ventricular size is normal. Cerebellum is unremarkable. Vascular: No asymmetric hyperdense vasculature at the skull base. Skull: Intact Sinuses/Orbits: The paranasal sinuses are clear. The orbits are unremarkable. Other: Mastoid air cells and middle ear cavities are clear. CT CERVICAL SPINE FINDINGS Alignment: There is 2 mm anterolisthesis of C6 upon C7, likely posttraumatic in nature. Minimal anterolisthesis of C4 upon C5 is likely physiologic.  Skull base and vertebrae: The craniocervical junction is unremarkable. Atlantal dental interval is normal. There are acute fractures involving the left lamina and spinous process of C6. There is a fracture involving the left pedicle of C6 as well as the the superior articular facet of C7 extending into the left C6-7 facet with mild joint space widening noted. Finally, a fracture extends through the pedicle of C6 on the right. Soft tissues and spinal canal: A small epidural hematoma measuring 3 mm x 10 mm is seen superficial to the C6 laminar fracture demonstrating minimal effacement of the canal a smaller epidural hematoma is seen superiorly posterior to the thecal sac at C5-6. No paraspinal fluid collection is identified. There is subcutaneous infiltration within the posterior soft tissues superficial to the spinous processes likely representing edema. Disc levels: Review of the sagittal reformats demonstrates preservation of vertebral body height. There is intervertebral disc space  narrowing and endplate remodeling at A3-F5 in keeping with changes of moderate degenerative disc disease. 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 Review of the axial images confirms the above-mentioned fractures. There is multilevel uncovertebral and facet arthrosis resulting in multilevel mild neural foraminal narrowing. 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 Upper chest: Unremarkable Other: None significant IMPRESSION: No acute intracranial injury.  No calvarial fracture. Acute fracture of the cervical spine predominantly involving the posterior elements of C6 with fracture bilateral pedicles and minimal anterolisthesis of C6 upon C7. The fracture pattern is suggestive of a hyperextension injury. Fractures also are identified involving the spinous process of C6, the left lamina of C6, and the left C7 superior articular facet extending into the left C6-7 facet which demonstrates mild relative  widening. 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 Superimposed small epidural hematoma at the level of the fracture and at C5-6 slightly superiorly. These results will be called to the ordering clinician or representative by the Radiologist Assistant, and communication documented in the PACS or [employer]. Electronically Signed   By: Fidela Salisbury MD   On: 06/03/2020 02:26   CT CERVICAL SPINE WO CONTRAST  Result Date: 06/03/2020 CLINICAL DATA:  79 year old male with altered mental status. Status post fall down stairs with acute lower cervical spine fracture with dorsal hematoma in the spinal canal contributing to moderate spinal stenosis at C6-C7. Multilevel degenerative spinal stenosis. Postoperative day zero ACDF. EXAM: CT CERVICAL SPINE WITHOUT CONTRAST TECHNIQUE: Multidetector CT imaging of the cervical spine was performed without intravenous contrast. Multiplanar CT image reconstructions were also generated. COMPARISON:  Cervical spine CT 0150 hours today. Preoperative cervical spine MRI 0503 hours today. FINDINGS: Alignment: Mildly improved cervical lordosis from earlier today. Subtle anterolisthesis of C4 on C5 is stable and appears to be degenerative in nature. Mild posttraumatic anterolisthesis of C6 on C7 appears stable. See additional details below. Skull base and vertebrae: Bilateral C6 pedicle fractures remain nondisplaced. There is less distraction of the C6 lamina fractures now. A nondisplaced fracture through the left C7 superior articulating facet and lamina is stable (sagittal image 41 now). Postoperative details are below. No new osseous abnormality identified. Stable subchondral cyst at the left odontoid. Visualized skull base is intact. No atlanto-occipital dissociation. Soft tissues and spinal canal: Ventral and posterior increased intraspinal density (sagittal image 30 with soft tissue windows) appear stable from the CT earlier this morning and more resembles ligamentous  hypertrophy than intraspinal hemorrhage on the MRI. Small volume of prevertebral and small to moderate volume left lateral neck soft tissue gas is related to interval ACDF. No postoperative fluid collection is evident. Disc levels: Stable CT appearance of the level C2-C3 through C4-C5. Including moderate to severe multifactorial spinal stenosis at C3-C4 (series 3, image 38). C5-C6: Stable disc space loss, circumferential disc osteophyte complex. Mild spinal stenosis, severe left and moderate right C6 foraminal stenosis at this level appears stable from the preoperative MRI. C6-C7: Interval ACDF. Hardware appears intact with no adverse features. Small volume of posterior dural or epidural hemorrhage here appears not significantly changed (series 3, image 56), about 2 mm in thickness. No CT evidence of increased spinal stenosis. And C7 foraminal patency especially on the left appears improved. C7-T1:  Stable, relatively negative. Upper chest: Visible upper thoracic levels remain grossly intact. Mild apical lung scarring on the left is stable. IMPRESSION: 1. Interval ACDF with no adverse hardware features. Bilateral C6 posterior element and left C7 superior articulating facet fractures now  are largely nondisplaced. A small volume of dorsal intraspinal hemorrhage eccentric to the left appears stable. No increased spinal stenosis here evident by CT, and C7 foraminal patency appears improved especially on the left. 2. Degenerative lumbar spinal stenosis elsewhere appears stable, and is moderate to severe at C3-C4 as previously noted. 3. No new osseous abnormality identified. Small volume postoperative gas in the neck. Electronically Signed   By: Genevie Ann M.D.   On: 06/03/2020 22:11   CT Cervical Spine Wo Contrast  Result Date: 06/03/2020 CLINICAL DATA:  Fall downstairs, head injury, headache EXAM: CT HEAD WITHOUT CONTRAST CT CERVICAL SPINE WITHOUT CONTRAST TECHNIQUE: Multidetector CT imaging of the head and cervical  spine was performed following the standard protocol without intravenous contrast. Multiplanar CT image reconstructions of the cervical spine were also generated. COMPARISON:  None. FINDINGS: CT HEAD FINDINGS Brain: Normal anatomic configuration of the brain. Mild parenchymal volume loss is commensurate with the patient's age. There are moderate to severe periventricular and subcortical white matter changes present likely reflecting the sequela of small vessel ischemia. 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 No acute intracranial hemorrhage or infarct. No abnormal mass effect or midline shift. No abnormal intra or extra-axial mass lesion or fluid collection. Ventricular size is normal. Cerebellum is unremarkable. Vascular: No asymmetric hyperdense vasculature at the skull base. Skull: Intact Sinuses/Orbits: The paranasal sinuses are clear. The orbits are unremarkable. Other: Mastoid air cells and middle ear cavities are clear. CT CERVICAL SPINE FINDINGS Alignment: There is 2 mm anterolisthesis of C6 upon C7, likely posttraumatic in nature. Minimal anterolisthesis of C4 upon C5 is likely physiologic. Skull base and vertebrae: The craniocervical junction is unremarkable. Atlantal dental interval is normal. There are acute fractures involving the left lamina and spinous process of C6. There is a fracture involving the left pedicle of C6 as well as the the superior articular facet of C7 extending into the left C6-7 facet with mild joint space widening noted. Finally, a fracture extends through the pedicle of C6 on the right. Soft tissues and spinal canal: A small epidural hematoma measuring 3 mm x 10 mm is seen superficial to the C6 laminar fracture demonstrating minimal effacement of the canal a smaller epidural hematoma is seen superiorly posterior to the thecal sac at C5-6. No paraspinal fluid collection is identified. There is subcutaneous infiltration within the posterior soft tissues  superficial to the spinous processes likely representing edema. Disc levels: Review of the sagittal reformats demonstrates preservation of vertebral body height. There is intervertebral disc space narrowing and endplate remodeling at W0-J8 in keeping with changes of moderate degenerative disc disease. 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 Review of the axial images confirms the above-mentioned fractures. There is multilevel uncovertebral and facet arthrosis resulting in multilevel mild neural foraminal narrowing. 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 Upper chest: Unremarkable Other: None significant IMPRESSION: No acute intracranial injury.  No calvarial fracture. Acute fracture of the cervical spine predominantly involving the posterior elements of C6 with fracture bilateral pedicles and minimal anterolisthesis of C6 upon C7. The fracture pattern is suggestive of a hyperextension injury. Fractures also are identified involving the spinous process of C6, the left lamina of C6, and the left C7 superior articular facet extending into the left C6-7 facet which demonstrates mild relative widening. 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 Superimposed small epidural hematoma at the level of the fracture and at C5-6 slightly superiorly. These results will be called to  the ordering clinician or representative by the Radiologist Assistant, and communication documented in the PACS or [employer]. Electronically Signed   By: Fidela Salisbury MD   On: 06/03/2020 02:26   MR Cervical Spine Wo Contrast  Result Date: 06/03/2020 CLINICAL DATA:  Fall with cervical spine fracture. EXAM: MRI CERVICAL SPINE WITHOUT CONTRAST TECHNIQUE: Multiplanar, multisequence MR imaging of the cervical spine was performed. No intravenous contrast was administered. COMPARISON:  Cervical spine CT 06/03/2020 FINDINGS: Image quality is degraded by motion artifact and increased image noise due to  the presence of a cervical spine collar limiting coil positioning. Alignment: Unchanged trace anterolisthesis of C6 on C7. Vertebrae: Mild posterior element edema at C6-7 related to underlying fractures, better demonstrated on CT. Chronic degenerative endplate changes at U3-1 and C6-7. Cord: No convincing spinal cord signal abnormality is identified within study limitations. A small dorsal epidural hematoma at C6-7 measures up to 3 mm in thickness. Posterior Fossa, vertebral arteries, paraspinal tissues: Minimal prevertebral edema. Mild right and moderate left-sided posterior paraspinal soft tissue edema in the mid and lower cervical spine including in the inter spinous soft tissues at C5-6 and C6-7. Disc levels: The cervical spinal canal is diffusely narrow on a congenital basis. Detailed assessment of disc and facet degeneration is limited by motion, however there is severe multifactorial spinal stenosis at C3-4 greater than C4-5 with moderate cord flattening. Multifactorial spinal stenosis at C5-6 is moderate, and there is also moderate spinal stenosis at C6-7 in part secondary to the epidural hematoma. IMPRESSION: 1. Motion degraded examination. 2. Known posterior element fractures at C6-7 with associated mild marrow edema and moderate paraspinal soft tissue/posterior ligamentous complex edema. 3. Small dorsal epidural hematoma at C6-7 contributing to moderate spinal stenosis. 4. No convincing spinal cord signal abnormality. 5. 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 Electronically Signed   By: Logan Bores M.D.   On: 06/03/2020 06:12   DG Chest Portable 1 View  Result Date: 06/03/2020 CLINICAL DATA:  Foreign body evaluation for MRI. EXAM: PORTABLE CHEST 1 VIEW COMPARISON:  None. FINDINGS: The cardiomediastinal silhouette is within normal limits. The patient's chin projects over the right lung apex. There is slight prominence of the interstitial markings without overt pulmonary edema.  No confluent airspace opacity, sizeable pleural effusion, or pneumothorax is identified. No acute osseous abnormality is seen. IMPRESSION: No radiopaque foreign body identified. Electronically Signed   By: Logan Bores M.D.   On: 06/03/2020 04:35   DG Shoulder Left  Result Date: 06/03/2020 CLINICAL DATA:  Pain EXAM: LEFT SHOULDER - 2+ VIEW COMPARISON:  None. FINDINGS: There is no evidence of fracture or dislocation. There is no evidence of arthropathy or other focal bone abnormality. Soft tissues are unremarkable. IMPRESSION: Negative. Electronically Signed   By: Constance Holster M.D.   On: 06/03/2020 02:55   DG C-Arm 1-60 Min  Result Date: 06/03/2020 CLINICAL DATA:  Cervical fusion EXAM: CERVICAL SPINE - 2-3 VIEW; DG C-ARM 1-60 MIN COMPARISON:  MRI from earlier in the same day. FLUOROSCOPY TIME:  Radiation Exposure Index (as provided by the fluoroscopic device): Not available If the device does not provide the exposure index: Fluoroscopy Time: 6 seconds Number of Acquired Images:  2 FINDINGS: Initial image demonstrates a surgical instrument anterior to the cervical spine at the C6-7 level. Mild anterolisthesis of C6 on C7 is seen. Subsequent interbody fusion and anterior fixation is noted at C6-7. IMPRESSION: Interbody fusion at C6-7 with anterior fixation Electronically Signed   By: Elta Guadeloupe  Lukens M.D.   On: 06/03/2020 20:21    Microbiology: Recent Results (from the past 240 hour(s))  Respiratory Panel by RT PCR (Flu A&B, Covid) - Nasopharyngeal Swab     Status: None   Collection Time: 06/03/20  3:20 AM   Specimen: Nasopharyngeal Swab  Result Value Ref Range Status   SARS Coronavirus 2 by RT PCR NEGATIVE NEGATIVE Final    Comment: (NOTE) SARS-CoV-2 target nucleic acids are NOT DETECTED.  The SARS-CoV-2 RNA is generally detectable in upper respiratoy specimens during the acute phase of infection. The lowest concentration of SARS-CoV-2 viral copies this assay can detect is 131 copies/mL. A  negative result does not preclude SARS-Cov-2 infection and should not be used as the sole basis for treatment or other patient management decisions. A negative result may occur with  improper specimen collection/handling, submission of specimen other than nasopharyngeal swab, presence of viral mutation(s) within the areas targeted by this assay, and inadequate number of viral copies (<131 copies/mL). A negative result must be combined with clinical observations, patient history, and epidemiological information. The expected result is Negative.  Fact Sheet for Patients:  PinkCheek.be  Fact Sheet for Healthcare Providers:  GravelBags.it  This test is no t yet approved or cleared by the [place] FDA and  has been authorized for detection and/or diagnosis of SARS-CoV-2 by FDA under an Emergency Use Authorization (EUA). This EUA will remain  in effect (meaning this test can be used) for the duration of the COVID-19 declaration under Section 564(b)(1) of the Act, 21 U.S.C. section 360bbb-3(b)(1), unless the authorization is terminated or revoked sooner.     Influenza A by PCR NEGATIVE NEGATIVE Final   Influenza B by PCR NEGATIVE NEGATIVE Final    Comment: (NOTE) The Xpert Xpress SARS-CoV-2/FLU/RSV assay is intended as an aid in  the diagnosis of influenza from Nasopharyngeal swab specimens and  should not be used as a sole basis for treatment. Nasal washings and  aspirates are unacceptable for Xpert Xpress SARS-CoV-2/FLU/RSV  testing.  Fact Sheet for Patients: PinkCheek.be  Fact Sheet for Healthcare Providers: GravelBags.it  This test is not yet approved or cleared by the [place] FDA and  has been authorized for detection and/or diagnosis of SARS-CoV-2 by  FDA under an Emergency Use Authorization (EUA). This EUA will remain  in effect (meaning this test can  be used) for the duration of the  Covid-19 declaration under Section 564(b)(1) of the Act, 21  U.S.C. section 360bbb-3(b)(1), unless the authorization is  terminated or revoked. Performed at [facility], [address] [place], [place] [address]      Labs: Basic Metabolic Panel: Recent Labs  Lab 06/05/20 1519 06/06/20 0426 06/06/20 1245 06/09/20 0659 06/10/20 0550  NA 139 144  --  142  --   K 3.8 4.0  --  3.9  --   CL 104 104  --  105  --   CO2 23 27  --  26  --   GLUCOSE 219* 152* 394* 104*  --   BUN 23 23  --  22  --   CREATININE 0.92 0.96  --  0.88 0.95  CALCIUM 8.7* 8.7*  --  8.8*  --    Liver Function Tests: Recent Labs  Lab 06/05/20 1519  AST 26  ALT 18  ALKPHOS 62  BILITOT 1.3*  PROT 6.7  ALBUMIN 3.6   No results for input(s): LIPASE, AMYLASE in the last 168 hours. No results for input(s): AMMONIA  in the last 168 hours. CBC: Recent Labs  Lab 06/05/20 1519 06/06/20 0425 06/09/20 0659  WBC 16.6* 13.0* 8.7  NEUTROABS 15.3*  --   --   HGB 14.5 14.1 14.1  HCT 42.1 42.4 42.7  MCV 85.1 87.6 87.9  PLT 166 169 158   Cardiac Enzymes: No results for input(s): CKTOTAL, CKMB, CKMBINDEX, TROPONINI in the last 168 hours. BNP: BNP (last 3 results) No results for input(s): BNP in the last 8760 hours.  ProBNP (last 3 results) No results for input(s): PROBNP in the last 8760 hours.  CBG: Recent Labs  Lab 06/10/20 0727 06/10/20 1037 06/10/20 1639 06/10/20 2152 06/11/20 1126  GLUCAP 125* 216* 109* 161* 180*       Signed:  Kayleen Memos, MD [facility] 06/11/2020, 12:20 PM

## 2020-06-11 NOTE — Progress Notes (Signed)
PT Cancellation Note  Patient Details Name: Mitchell Stewart MRN: [id number] DOB: 1941-02-18   Cancelled Treatment:     PT attempt. Pt continues to be very resistive to any activity or OOB. Unwilling to reapply brace. Will continue to follow and try to get patient to participate.    Willette Pa 06/11/2020, 5:09 PM

## 2020-06-12 DIAGNOSIS — S129XXA Fracture of neck, unspecified, initial encounter: Secondary | ICD-10-CM | POA: Diagnosis not present

## 2020-06-12 LAB — CBC WITH DIFFERENTIAL/PLATELET
Abs Immature Granulocytes: 0.16 10*3/uL — ABNORMAL HIGH (ref 0.00–0.07)
Basophils Absolute: 0 10*3/uL (ref 0.0–0.1)
Basophils Relative: 0 %
Eosinophils Absolute: 0.1 10*3/uL (ref 0.0–0.5)
Eosinophils Relative: 1 %
HCT: 41 % (ref 39.0–52.0)
Hemoglobin: 13.7 g/dL (ref 13.0–17.0)
Immature Granulocytes: 2 %
Lymphocytes Relative: 13 %
Lymphs Abs: 1.2 10*3/uL (ref 0.7–4.0)
MCH: 28.5 pg (ref 26.0–34.0)
MCHC: 33.4 g/dL (ref 30.0–36.0)
MCV: 85.4 fL (ref 80.0–100.0)
Monocytes Absolute: 0.9 10*3/uL (ref 0.1–1.0)
Monocytes Relative: 10 %
Neutro Abs: 6.8 10*3/uL (ref 1.7–7.7)
Neutrophils Relative %: 74 %
Platelets: 277 10*3/uL (ref 150–400)
RBC: 4.8 MIL/uL (ref 4.22–5.81)
RDW: 12.5 % (ref 11.5–15.5)
WBC: 9.1 10*3/uL (ref 4.0–10.5)
nRBC: 0 % (ref 0.0–0.2)

## 2020-06-12 LAB — BASIC METABOLIC PANEL
Anion gap: 11 (ref 5–15)
BUN: 19 mg/dL (ref 8–23)
CO2: 27 mmol/L (ref 22–32)
Calcium: 8.9 mg/dL (ref 8.9–10.3)
Chloride: 94 mmol/L — ABNORMAL LOW (ref 98–111)
Creatinine, Ser: 0.92 mg/dL (ref 0.61–1.24)
GFR, Estimated: >60 mL/min (ref >60)
Glucose, Bld: 181 mg/dL — ABNORMAL HIGH (ref 70–99)
Potassium: 4 mmol/L (ref 3.5–5.1)
Sodium: 132 mmol/L — ABNORMAL LOW (ref 135–145)

## 2020-06-12 LAB — GLUCOSE, CAPILLARY
Glucose-Capillary: 125 mg/dL — ABNORMAL HIGH (ref 70–99)
Glucose-Capillary: 179 mg/dL — ABNORMAL HIGH (ref 70–99)
Glucose-Capillary: 150 mg/dL — ABNORMAL HIGH (ref 70–99)
Glucose-Capillary: 194 mg/dL — ABNORMAL HIGH (ref 70–99)

## 2020-06-12 NOTE — TOC Progression Note (Addendum)
Transition of Care [facility]) - Progression Note    Patient Details  Name: MAMOUDOU MULVEHILL MRN: [id number] Date of Birth: 1941-03-08  Transition of Care [facility]) CM/SW [place], RN Phone Number: 06/12/2020, 7:26 AM  Clinical Narrative:   RNCM awaiting call backs from [facility] and [facility] SNF, RNCM extended bed search further out into [place] and [place] in attempt to find facility that will accept patient's insurance.   10am: RNCM received phone call from patient's son Lorry who has reached out to a couple of facilities in [place]. He reports that both [facility] and [facility] have availability and accept his insurance. RNCM reached out to [place] with [facility] but after discussion about patient, they are unable to extend a bed offer. RNCM reached out to [facility] with [facility] and clinicals were faxed to [phone].   Expected Discharge Plan: [place] Barriers to Discharge: No Barriers Identified  Expected Discharge Plan and Services Expected Discharge Plan: [place] Choice: [place] arrangements for the past 2 months: Single Family Home Expected Discharge Date: 06/10/20                                     Social Determinants of Health (SDOH) Interventions    Readmission Risk Interventions No flowsheet data found.

## 2020-06-12 NOTE — TOC Progression Note (Signed)
Transition of Care [facility]) - Progression Note    Patient Details  Name: Mitchell Stewart MRN: [id number] Date of Birth: Mar 14, 1941  Transition of Care [facility]) CM/SW [place], RN Phone Number: 06/12/2020, 1:42 PM  Clinical Narrative:   Patient received bed offer from [facility]. RNCM reached out to Shanon Brow, patient's son and he is agreeable to patient being placed there. Returned call to Abigail Butts with [facility] and accepted bed, she will start insurance authorization.      Expected Discharge Plan: [place] Barriers to Discharge: No Barriers Identified  Expected Discharge Plan and Services Expected Discharge Plan: [place] Choice: [place] arrangements for the past 2 months: Single Family Home Expected Discharge Date: 06/10/20                                     Social Determinants of Health (SDOH) Interventions    Readmission Risk Interventions No flowsheet data found.

## 2020-06-12 NOTE — Progress Notes (Signed)
Occupational Therapy Treatment Patient Details Name: Mitchell Stewart MRN: [id number] DOB: 10/21/1940 Today's Date: 06/12/2020    History of present illness Pt is a 79 y/o M with PMH: dementia, DM, HOH, HLD and prostate cancer who presented to [facility] ED s/p fall down flight of stairs. Pt found to have cervical spine fx and is now s/p C6-7 ACDF (06/03/2020).   OT comments  Mr. Paolo was seen for OT treatment on this date. Upon arrival to room pt awake alert, semi-supine in bed with HOB noted to be <30 degrees, and hard collar off. Therapist adjust bed to >30 degrees per order. Pt declines OOB/EOB activity this date, but agreeable to bed level activity. He is noted to maintain a strong R head preference, however t/o functional tasks therapist is able to assist bringing his head to a more neutral position with use of towel rolls under pillow to support neutral positioning. Once pt well positioned therapist facilitates UB grooming tasks as described below. Please see ADL section for additional detail regarding occupational performance. Pt continues to requires significant assist and cueing to maintain cervical precautions during ADL management. He is progressing toward goals and continues to benefit from skilled OT services to maximize return to PLOF and minimize risk of future falls, injury, caregiver burden, and readmission. Will continue to follow POC as written. Discharge recommendation remains appropriate.    Follow Up Recommendations  SNF;Supervision/Assistance - 24 hour    Equipment Recommendations       Recommendations for Other Services      Precautions / Restrictions Precautions Precautions: Fall;Cervical Precaution Booklet Issued: No Precaution Comments: Brace on at all times (can be applied seated EOB per order) Required Braces or Orthoses: Cervical Brace Cervical Brace: Hard collar Restrictions Weight Bearing Restrictions: No Other Position/Activity Restrictions: [place] J collar, HOB  30 degrees, okay per MD to take brace off temporaily to adjust brace       Mobility Bed Mobility Overal bed mobility: Needs Assistance Bed Mobility: Sit to Supine       Sit to supine: Max assist   General bed mobility comments: Mobility deferred. Pt declines, stating he is comfortable in bed, agreeable to bed-level session only this date.  Transfers Overall transfer level: Needs assistance Equipment used: Rolling walker (2 wheeled) Transfers: Stand Pivot Transfers           General transfer comment: pt was able to stand pivot from recliner to EOB without use of RW however required BUE support by therapist for safety    Balance Overall balance assessment: Needs assistance Sitting-balance support: Feet supported;Bilateral upper extremity supported Sitting balance-Leahy Scale: Poor     Standing balance support: Bilateral upper extremity supported;During functional activity Standing balance-Leahy Scale: Poor                             ADL either performed or assessed with clinical judgement   ADL Overall ADL's : Needs assistance/impaired     Grooming: Wash/dry face;Oral care;Cueing for sequencing;Maximal assistance;Bed level Grooming Details (indicate cue type and reason): Pt performs grooming tasks at bed level given set up of all materials and MAX A to perform task. He responds well to positive approach strategies including hand under hand assist with holding grooming items. He requires significant increased time to perform basic tasks and is noted with strong head preference to R. OT attempts to bring head to neutral position during grooming. Use of towel roll under pillow to  support neutral positioning.                                     Vision Baseline Vision/History: Cataracts Patient Visual Report: No change from baseline     Perception     Praxis      Cognition Arousal/Alertness: Awake/alert Behavior During Therapy: WFL for  tasks assessed/performed Overall Cognitive Status: History of cognitive impairments - at baseline                                 General Comments: Pt oriented to self only. He is overheard speaking to unseen others after therapist leaves room to gather supplies and returns. Pt easily re-directed to therapy session. He follows 1 step VCs with increased time and multimodal prompting. He is recieved with cervical brace removed.        Exercises Other Exercises Other Exercises: OT facilitates bed level grooming tasks. See ADL section for additional detail regarding occupational performance.   Shoulder Instructions       General Comments      Pertinent Vitals/ Pain       Pain Assessment: No/denies pain Pain Score: 0-No pain Faces Pain Scale: No hurt Pain Descriptors / Indicators: Grimacing;Guarding Pain Intervention(s): Monitored during session  Home Living                                          Prior Functioning/Environment              Frequency  Min 1X/week        Progress Toward Goals  OT Goals(current goals can now be found in the care plan section)  Progress towards OT goals: Progressing toward goals  Acute Rehab OT Goals Patient Stated Goal: none stated Time For Goal Achievement: 06/18/20 Potential to Achieve Goals: [place] Discharge plan remains appropriate    Co-evaluation                 AM-PAC OT "6 Clicks" Daily Activity     Outcome Measure   Help from another person eating meals?: A Lot Help from another person taking care of personal grooming?: A Lot Help from another person toileting, which includes using toliet, bedpan, or urinal?: A Lot Help from another person bathing (including washing, rinsing, drying)?: A Lot Help from another person to put on and taking off regular upper body clothing?: A Lot Help from another person to put on and taking off regular lower body clothing?: Total 6 Click Score:  11    End of Session    OT Visit Diagnosis: Unsteadiness on feet (R26.81);Other abnormalities of gait and mobility (R26.89);Repeated falls (R29.6);Muscle weakness (generalized) (M62.81);History of falling (Z91.81)   Activity Tolerance Patient tolerated treatment well   Patient Left in bed;with call bell/phone within reach;with bed alarm set;Other (comment) (HOB >30 degress)   Nurse Communication          Time: 0258-5277 OT Time Calculation (min): 27 min  Charges: OT General Charges $OT Visit: 1 Visit OT Treatments $Therapeutic Activity: 23-37 mins  Shara Blazing, M.S., OTR/L Ascom: [phone] 06/12/20, 4:26 PM

## 2020-06-12 NOTE — Plan of Care (Signed)
  Problem: Education: Goal: Knowledge of General Education information will improve Description: Including pain rating scale, medication(s)/side effects and non-pharmacologic comfort measures Outcome: Progressing   Problem: Health Behavior/Discharge Planning: Goal: Ability to manage health-related needs will improve Outcome: Progressing   Problem: Clinical Measurements: Goal: Ability to maintain clinical measurements within normal limits will improve Outcome: Progressing Goal: Will remain free from infection Outcome: Progressing Goal: Diagnostic test results will improve Outcome: Progressing Goal: Respiratory complications will improve Outcome: Progressing Goal: Cardiovascular complication will be avoided Outcome: Progressing   Problem: Activity: Goal: Risk for activity intolerance will decrease Outcome: Progressing   Problem: Nutrition: Goal: Adequate nutrition will be maintained Outcome: Progressing   Problem: Coping: Goal: Level of anxiety will decrease Outcome: Progressing   Problem: Elimination: Goal: Will not experience complications related to bowel motility Outcome: Progressing Goal: Will not experience complications related to urinary retention Outcome: Progressing   Problem: Pain Managment: Goal: General experience of comfort will improve Outcome: Progressing   Problem: Safety: Goal: Ability to remain free from injury will improve Outcome: Progressing   Problem: Skin Integrity: Goal: Risk for impaired skin integrity will decrease Outcome: Progressing   Problem: Education: Goal: Ability to verbalize activity precautions or restrictions will improve Outcome: Progressing Goal: Knowledge of the prescribed therapeutic regimen will improve Outcome: Progressing Goal: Understanding of discharge needs will improve Outcome: Progressing   Problem: Activity: Goal: Ability to avoid complications of mobility impairment will improve Outcome: Progressing Goal:  Ability to tolerate increased activity will improve Outcome: Progressing Goal: Will remain free from falls Outcome: Progressing   Problem: Bowel/Gastric: Goal: Gastrointestinal status for postoperative course will improve Outcome: Progressing   Problem: Clinical Measurements: Goal: Ability to maintain clinical measurements within normal limits will improve Outcome: Progressing Goal: Postoperative complications will be avoided or minimized Outcome: Progressing Goal: Diagnostic test results will improve Outcome: Progressing   Problem: Pain Management: Goal: Pain level will decrease Outcome: Progressing   Problem: Skin Integrity: Goal: Will show signs of wound healing Outcome: Progressing   Problem: Health Behavior/Discharge Planning: Goal: Identification of resources available to assist in meeting health care needs will improve Outcome: Progressing   Problem: Bladder/Genitourinary: Goal: Urinary functional status for postoperative course will improve Outcome: Progressing   Problem: Education: Goal: Ability to verbalize activity precautions or restrictions will improve Outcome: Progressing Goal: Knowledge of the prescribed therapeutic regimen will improve Outcome: Progressing Goal: Understanding of discharge needs will improve Outcome: Progressing   Problem: Activity: Goal: Ability to avoid complications of mobility impairment will improve Outcome: Progressing Goal: Ability to tolerate increased activity will improve Outcome: Progressing Goal: Will remain free from falls Outcome: Progressing   Problem: Bowel/Gastric: Goal: Gastrointestinal status for postoperative course will improve Outcome: Progressing   Problem: Clinical Measurements: Goal: Ability to maintain clinical measurements within normal limits will improve Outcome: Progressing Goal: Postoperative complications will be avoided or minimized Outcome: Progressing Goal: Diagnostic test results will  improve Outcome: Progressing   Problem: Pain Management: Goal: Pain level will decrease Outcome: Progressing   Problem: Skin Integrity: Goal: Will show signs of wound healing Outcome: Progressing   Problem: Health Behavior/Discharge Planning: Goal: Identification of resources available to assist in meeting health care needs will improve Outcome: Progressing   Problem: Bladder/Genitourinary: Goal: Urinary functional status for postoperative course will improve Outcome: Progressing   

## 2020-06-12 NOTE — Progress Notes (Signed)
PROGRESS NOTE    Mitchell Stewart  PTW:[id number] DOB: [birth year] DOA: 06/03/2020 PCP: Lesia Hausen, PA   Brief Narrative: Mitchell Stewart y.o.malewith a known history of type 2 diabetes mellitus, dyslipidemia,prostate cancer,dementia andchronicdepression, [facility] onset of accidental mechanical [facility] after getting up to go to the bathroom and falling down a flight of 15 stairs landing on his back on concrete with subsequent neck pain as well as bilateral elbow pain. Patient has significant dementia and unable toprovide ahistory orreview of systems.  Patient currently medically stable for discharge.  Pending placement   Assessment & Plan:   Active Problems:   Traumatic fracture of cervical spine ([place])  AcuteC6-C7 traumatic spine fracture secondary to mechanical fallpost repair by neurosurgery on 06/03/20. -Continueimmobilizing his C-spine with neck collar until seen by neurosurgery outpatient. -C-spine MRI -- unstable C6-7 fracture, was taken to the OR as stated above. -Pain management with PO pain meds -Neurosurgery consultation with Dr. Deetta Perla -PT OTrecommended SNF and 24 hours assistance --TOCassisting with SNF placement./Expanded to surrounding areas  Type II diabetesmellituswith hyperglycemia --A1C 7.6 on 06/03/20 -Lantus dose 7 units twice daily insulin sliding scale moderate CBG improved with increased subcu insulin doses.  Dyslipidemia. -continue statin therapy.  Dementiawithout behavioral disturbance. No behavioral issues since admitted. Continue home Seroquel  Chronic depression Stable Continue home Zoloft  BPH. -continue Flomax. Monitor urine output  History of prostate cancer Not on home cancer medications   DVT prophylaxis. -Lovenox, subcu daily.  ResolvedRUQ pain unclear etiology UA negative for pyuria.    DVT prophylaxis: Lovenox Code Status: Full Family Communication: None  today Disposition Plan: Status is: Inpatient  Remains inpatient appropriate because:Unsafe d/c plan   Dispo:  Patient From: Home  Planned Disposition: [place]  Expected discharge date: 06/13/20  Medically stable for discharge: Yes  Patient currently postop day 10 status post C6/C7 operative fixation with neurosurgery.  Medically stable for discharge.  Unable to find accepting skilled nursing facility.  TOC involved.  Expanding source surrounding areas.  Discharge when bed is found.   Consultants:   Neurosurgery  Procedures:   C6/C7 operative fixation  Antimicrobials:   None   Subjective: Seen and examined.  No complaints.  Appears chronically ill.  Voice weak.  Objective: Vitals:   06/11/20 0700 06/11/20 1650 06/11/20 2248 06/12/20 0733  BP: (!) 146/91 (!) 151/92 (!) 171/99 (!) 144/86  Pulse: 80 84 87 93  Resp: 16 16 20 20   Temp: 98.6 F (37 C) 98 F (36.7 C) 97.8 F (36.6 C) 98.2 F (36.8 C)  TempSrc: Axillary Axillary Oral   SpO2: 99% 95% 99% 99%  Weight:      Height:        Intake/Output Summary (Last 24 hours) at 06/12/2020 1341 Last data filed at 06/11/2020 1907 Gross per 24 hour  Intake 600 ml  Output --  Net 600 ml   Filed Weights   06/03/20 0121  Weight: 56.7 kg    Examination:  General exam: No acute distress.  Appears frail Respiratory system: Clear to auscultation. Respiratory effort normal. Cardiovascular system: S1 & S2 heard, RRR. No JVD, murmurs, rubs, gallops or clicks. No pedal edema. Gastrointestinal system: Abdomen is nondistended, soft and nontender. No organomegaly or masses felt. Normal bowel sounds heard. Central nervous system: Alert and oriented. No focal neurological deficits. Extremities: Symmetric 5 x 5 power. Skin: No rashes, lesions or ulcers Psychiatry: Judgement and insight appear normal. Mood & affect flattened.     Data  Reviewed: I have personally reviewed following labs and imaging  studies  CBC: Recent Labs  Lab 06/05/20 1519 06/06/20 0425 06/09/20 0659 06/12/20 1230  WBC 16.6* 13.0* 8.7 9.1  NEUTROABS 15.3*  --   --  6.8  HGB 14.5 14.1 14.1 13.7  HCT 42.1 42.4 42.7 41.0  MCV 85.1 87.6 87.9 85.4  PLT 166 169 158 703   Basic Metabolic Panel: Recent Labs  Lab 06/05/20 1519 06/06/20 0426 06/06/20 1245 06/09/20 0659 06/10/20 0550 06/12/20 1230  NA 139 144  --  142  --  132*  K 3.8 4.0  --  3.9  --  4.0  CL 104 104  --  105  --  94*  CO2 23 27  --  26  --  27  GLUCOSE 219* 152* 394* 104*  --  181*  BUN 23 23  --  22  --  19  CREATININE 0.92 0.96  --  0.88 0.95 0.92  CALCIUM 8.7* 8.7*  --  8.8*  --  8.9   GFR: Estimated Creatinine Clearance: 53.1 mL/min (by C-G formula based on SCr of 0.92 mg/dL). Liver Function Tests: Recent Labs  Lab 06/05/20 1519  AST 26  ALT 18  ALKPHOS 62  BILITOT 1.3*  PROT 6.7  ALBUMIN 3.6   No results for input(s): LIPASE, AMYLASE in the last 168 hours. No results for input(s): AMMONIA in the last 168 hours. Coagulation Profile: No results for input(s): INR, PROTIME in the last 168 hours. Cardiac Enzymes: No results for input(s): CKTOTAL, CKMB, CKMBINDEX, TROPONINI in the last 168 hours. BNP (last 3 results) No results for input(s): PROBNP in the last 8760 hours. HbA1C: No results for input(s): HGBA1C in the last 72 hours. CBG: Recent Labs  Lab 06/11/20 1126 06/11/20 1653 06/11/20 2118 06/12/20 0732 06/12/20 1143  GLUCAP 180* 168* 139* 125* 179*   Lipid Profile: No results for input(s): CHOL, HDL, LDLCALC, TRIG, CHOLHDL, LDLDIRECT in the last 72 hours. Thyroid Function Tests: No results for input(s): TSH, T4TOTAL, FREET4, T3FREE, THYROIDAB in the last 72 hours. Anemia Panel: No results for input(s): VITAMINB12, FOLATE, FERRITIN, TIBC, IRON, RETICCTPCT in the last 72 hours. Sepsis Labs: No results for input(s): PROCALCITON, LATICACIDVEN in the last 168 hours.  Recent Results (from the past 240  hour(s))  Respiratory Panel by RT PCR (Flu A&B, Covid) - Nasopharyngeal Swab     Status: None   Collection Time: 06/03/20  3:20 AM   Specimen: Nasopharyngeal Swab  Result Value Ref Range Status   SARS Coronavirus 2 by RT PCR NEGATIVE NEGATIVE Final    Comment: (NOTE) SARS-CoV-2 target nucleic acids are NOT DETECTED.  The SARS-CoV-2 RNA is generally detectable in upper respiratoy specimens during the acute phase of infection. The lowest concentration of SARS-CoV-2 viral copies this assay can detect is 131 copies/mL. A negative result does not preclude SARS-Cov-2 infection and should not be used as the sole basis for treatment or other patient management decisions. A negative result may occur with  improper specimen collection/handling, submission of specimen other than nasopharyngeal swab, presence of viral mutation(s) within the areas targeted by this assay, and inadequate number of viral copies (<131 copies/mL). A negative result must be combined with clinical observations, patient history, and epidemiological information. The expected result is Negative.  Fact Sheet for Patients:  PinkCheek.be  Fact Sheet for Healthcare Providers:  GravelBags.it  This test is no t yet approved or cleared by the [place] and  has been authorized  for detection and/or diagnosis of SARS-CoV-2 by FDA under an Emergency Use Authorization (EUA). This EUA will remain  in effect (meaning this test can be used) for the duration of the COVID-19 declaration under Section 564(b)(1) of the Act, 21 U.S.C. section 360bbb-3(b)(1), unless the authorization is terminated or revoked sooner.     Influenza A by PCR NEGATIVE NEGATIVE Final   Influenza B by PCR NEGATIVE NEGATIVE Final    Comment: (NOTE) The Xpert Xpress SARS-CoV-2/FLU/RSV assay is intended as an aid in  the diagnosis of influenza from Nasopharyngeal swab specimens and  should not  be used as a sole basis for treatment. Nasal washings and  aspirates are unacceptable for Xpert Xpress SARS-CoV-2/FLU/RSV  testing.  Fact Sheet for Patients: PinkCheek.be  Fact Sheet for Healthcare Providers: GravelBags.it  This test is not yet approved or cleared by the [place] FDA and  has been authorized for detection and/or diagnosis of SARS-CoV-2 by  FDA under an Emergency Use Authorization (EUA). This EUA will remain  in effect (meaning this test can be used) for the duration of the  Covid-19 declaration under Section 564(b)(1) of the Act, 21  U.S.C. section 360bbb-3(b)(1), unless the authorization is  terminated or revoked. Performed at [facility], [address], [place], [place] [address]          Radiology Studies: No results found.      Scheduled Meds: . Chlorhexidine Gluconate Cloth  6 each Topical Daily  . enoxaparin (LOVENOX) injection  40 mg Subcutaneous Q24H  . insulin aspart  0-15 Units Subcutaneous TID WC  . insulin aspart  0-5 Units Subcutaneous QHS  . insulin glargine  7 Units Subcutaneous BID  . pravastatin  20 mg Oral q1800  . QUEtiapine  25 mg Oral QHS  . sertraline  150 mg Oral Daily  . tamsulosin  0.4 mg Oral Daily   Continuous Infusions: . sodium chloride 1,000 mL (06/04/20 1502)  . methocarbamol (ROBAXIN) IV Stopped (06/04/20 0700)     LOS: 9 days    Time spent: 15 minutes    Sidney Ace, MD [facility] Pager [phone]  If 7PM-7AM, please contact night-coverage 06/12/2020, 1:41 PM

## 2020-06-12 NOTE — Progress Notes (Addendum)
Physical Therapy Treatment Patient Details Name: Mitchell Stewart MRN: [id number] DOB: [birth year] Today's Date: 06/12/2020    History of Present Illness Pt is a 79 y/o M with PMH: dementia, DM, HOH, HLD and prostate cancer who presented to [facility] ED s/p fall down flight of stairs. Pt found to have cervical spine fx and is now s/p C6-7 ACDF (06/03/2020).    PT Comments    Author returned to room for 2nd session per POC. Pt was horizontally laying in recliner without cervical collar donned and asking for help. Assist pt back to bed via stand pivot with max assist for safety. Pt unwilling to re-apply cervical collar. RN was notified. Pt has inconsistent behaviors and cognition swings limiting progression with PT. Will continue to follow but will decrease frequency to 7 x a week per discussion with primary/evaluating PT.  Pt was in bed with bed alarm in place and call bell in reach. RN aware of pt's safety copncerns.     Follow Up Recommendations  SNF     Equipment Recommendations  Other (comment) (defer to next level of care)    Recommendations for Other Services       Precautions / Restrictions Precautions Precautions: Fall;Cervical Precaution Booklet Issued: No Precaution Comments: Brace on at all times (can be applied seated EOB per order) Required Braces or Orthoses: Cervical Brace Cervical Brace: Hard collar Restrictions Weight Bearing Restrictions: No    Mobility  Bed Mobility Overal bed mobility: Needs Assistance Bed Mobility: Sit to Supine       Sit to supine: Max assist      Transfers Overall transfer level: Needs assistance Equipment used: Rolling walker (2 wheeled) Transfers: Stand Pivot Transfers           General transfer comment: pt was able to stand pivot from recliner to EOB without use of RW however required BUE support by therapist for safety  Ambulation/Gait   General Gait Details: did not attempt ambulation 2/2 to pt''s cognition and wanting to  get beack into bed       Balance Overall balance assessment: Needs assistance Sitting-balance support: Feet supported;Bilateral upper extremity supported Sitting balance-Leahy Scale: Poor     Standing balance support: Bilateral upper extremity supported;During functional activity Standing balance-Leahy Scale: Poor           Cognition Arousal/Alertness: Awake/alert Behavior During Therapy: WFL for tasks assessed/performed Overall Cognitive Status: History of cognitive impairments - at baseline                    Pertinent Vitals/Pain Pain Assessment: No/denies pain Pain Score: 0-No pain Faces Pain Scale: No hurt Pain Descriptors / Indicators: Grimacing;Guarding Pain Intervention(s): Limited activity within patient's tolerance;Monitored during session;Premedicated before session;Repositioned           PT Goals (current goals can now be found in the care plan section) Acute Rehab PT Goals Patient Stated Goal: none stated Progress towards PT goals: Not progressing toward goals - comment (cognition slowing progression)    Frequency    BID      PT Plan Current plan remains appropriate       AM-PAC PT "6 Clicks" Mobility   Outcome Measure  Help needed turning from your back to your side while in a flat bed without using bedrails?: A Lot Help needed moving from lying on your back to sitting on the side of a flat bed without using bedrails?: A Lot Help needed moving to and from a bed to a  chair (including a wheelchair)?: A Lot Help needed standing up from a chair using your arms (e.g., wheelchair or bedside chair)?: A Lot Help needed to walk in hospital room?: A Lot Help needed climbing 3-5 steps with a railing? : Total 6 Click Score: 11    End of Session Equipment Utilized During Treatment: Gait belt Activity Tolerance: Patient tolerated treatment well;Patient limited by fatigue Patient left: in bed;with call bell/phone within reach;with bed alarm  set Nurse Communication: Mobility status PT Visit Diagnosis: Muscle weakness (generalized) (M62.81);Unsteadiness on feet (R26.81);History of falling (Z91.81)     Time: 0228-4069 PT Time Calculation (min) (ACUTE ONLY): 12 min  Charges:  $Therapeutic Activity: 8-22 mins                     Julaine Fusi PTA 06/12/20, 3:43 PM

## 2020-06-12 NOTE — Progress Notes (Signed)
Physical Therapy Treatment Patient Details Name: Mitchell Stewart MRN: [id number] DOB: 01/29/1941 Today's Date: 06/12/2020    History of Present Illness Pt is a 79 y/o M with PMH: dementia, DM, HOH, HLD and prostate cancer who presented to [facility] ED s/p fall down flight of stairs. Pt found to have cervical spine fx and is now s/p C6-7 ACDF (06/03/2020).    PT Comments    Pt was supine in bed with HOB elevated ~ 30 degrees. He is alert and oriented x1(self) was able to follow commands throughout session with a lot of increased time and max tcs/vcs. Cervical collar donned throughout session and left on post session. Performed log roll R to short sit. Sat EOB x several minutes prior to standing to RW with max assist. Pt was able to ambulate 2 x 39ft with chair follow and max cueing. Overall demonstrates progress but is limited by cognition deficits. Acute PT recommend DC to SNF to address deficits and improve independence. Will return later this date to continue current POC.    Follow Up Recommendations  SNF     Equipment Recommendations  Other (comment) (defer to next level of care)    Recommendations for Other Services       Precautions / Restrictions Precautions Precautions: Fall;Cervical Precaution Booklet Issued: No Precaution Comments: Brace on at all times (can be applied seated EOB per order) Required Braces or Orthoses: Cervical Brace Cervical Brace: Hard collar ([place]) Restrictions Weight Bearing Restrictions: No    Mobility  Bed Mobility Overal bed mobility: Needs Assistance Bed Mobility: Rolling;Supine to Sit Rolling: Mod assist;Max assist (alot of time required but less asssistance) Sidelying to sit: Max assist Supine to sit: Max assist     General bed mobility comments: Pt was able to bend L leg to use to assist with rolling to R side. from side lying did require max assist and cueing. applied cervical collar while in bed  Transfers Overall transfer level: Needs  assistance Equipment used: Rolling walker (2 wheeled) Transfers: Sit to/from Stand Sit to Stand: Mod assist;Max assist;From elevated surface         General transfer comment: pt was able to stand to RW 3 x throughout session. 1 x EOB and 2 x from recliner. mod assist for elevated bed height. amx assist form lower recliner height. max vcs/tcs every time.  Ambulation/Gait Ambulation/Gait assistance: Mod assist;Max assist Gait Distance (Feet): 8 Feet Assistive device: Rolling walker (2 wheeled) Gait Pattern/deviations: Narrow base of support;Scissoring;Drifts right/left;Staggering right;Shuffle Gait velocity: decreased   General Gait Details: pt was able to ambulate 1 x to recliner ~ 3 ft and 2 x 8 ft with RW with chair follow for safety.       Balance Overall balance assessment: Needs assistance Sitting-balance support: Feet supported;Bilateral upper extremity supported Sitting balance-Leahy Scale: Poor     Standing balance support: Bilateral upper extremity supported;During functional activity Standing balance-Leahy Scale: Poor      Cognition Arousal/Alertness: Awake/alert Behavior During Therapy: WFL for tasks assessed/performed Overall Cognitive Status: History of cognitive impairments - at baseline      General Comments: Pt is oriented to self only however more alert today and able to follow commands with icreased time and constant cueing. did allow therapist to apply brace this date and was able to tolerate wearing throughout session and even post session      Exercises Other Exercises Other Exercises: pt performed standing marching in place x 2 minutes prior to ambulating  Pertinent Vitals/Pain Pain Assessment: 0-10 Pain Score: 2  Faces Pain Scale: No hurt Pain Location: R side Pain Descriptors / Indicators: Grimacing;Guarding Pain Intervention(s): Limited activity within patient's tolerance;Monitored during session;Premedicated before  session;Repositioned           PT Goals (current goals can now be found in the care plan section) Acute Rehab PT Goals Patient Stated Goal: none stated Progress towards PT goals: Progressing toward goals    Frequency    BID      PT Plan Current plan remains appropriate       AM-PAC PT "6 Clicks" Mobility   Outcome Measure  Help needed turning from your back to your side while in a flat bed without using bedrails?: A Lot Help needed moving from lying on your back to sitting on the side of a flat bed without using bedrails?: A Lot Help needed moving to and from a bed to a chair (including a wheelchair)?: A Lot Help needed standing up from a chair using your arms (e.g., wheelchair or bedside chair)?: A Lot Help needed to walk in hospital room?: A Lot Help needed climbing 3-5 steps with a railing? : Total 6 Click Score: 11    End of Session Equipment Utilized During Treatment: Gait belt Activity Tolerance: Patient tolerated treatment well;Other (comment) (limited by cognition) Patient left: in chair;with call bell/phone within reach;with chair alarm set Nurse Communication: Mobility status PT Visit Diagnosis: Muscle weakness (generalized) (M62.81);Unsteadiness on feet (R26.81);History of falling (Z91.81)     Time: 1308-6578 PT Time Calculation (min) (ACUTE ONLY): 24 min  Julaine Fusi PTA 06/12/20, 10:31 AM

## 2020-06-13 LAB — GLUCOSE, CAPILLARY
Glucose-Capillary: 127 mg/dL — ABNORMAL HIGH (ref 70–99)
Glucose-Capillary: 141 mg/dL — ABNORMAL HIGH (ref 70–99)
Glucose-Capillary: 95 mg/dL (ref 70–99)
Glucose-Capillary: 179 mg/dL — ABNORMAL HIGH (ref 70–99)

## 2020-06-13 MED ORDER — HYDRALAZINE HCL 20 MG/ML IJ SOLN: 10.0000 mg | INTRAMUSCULAR | Status: DC | PRN

## 2020-06-13 MED ORDER — AMLODIPINE BESYLATE 5 MG PO TABS
5.0000 mg | ORAL_TABLET | Freq: Every day | ORAL | Status: DC
  Administered 2020-06-14 – 2020-06-20 (×7): 5 mg via ORAL
  Filled 2020-06-13 (×8): qty 1

## 2020-06-13 NOTE — Progress Notes (Signed)
PROGRESS NOTE    Mitchell Stewart  EXH:[id number] DOB: [birth year] DOA: 06/03/2020 PCP: Mitchell Hausen, PA   Brief Narrative: Mitchell Stewart y.o.malewith a known history of type 2 diabetes mellitus, dyslipidemia,prostate cancer,dementia andchronicdepression, [facility] onset of accidental mechanical fall after getting up to go to the bathroom and falling down a flight of 15 stairs landing on his back on concrete with subsequent neck pain as well as bilateral elbow pain. Patient has significant dementia and unable toprovide ahistory orreview of systems.  Patient currently medically stable for discharge.  Pending placement.  He has been nonadherent with his cervical collar.  Neurosurgery aware.   Assessment & Plan:   Active Problems:   Traumatic fracture of cervical spine ([place])  AcuteC6-C7 traumatic spine fracture secondary to mechanical fallpost repair by neurosurgery on 06/03/20. -Continueimmobilizing his C-spine with neck collar until seen by neurosurgery outpatient. -C-spine MRI -- unstable C6-7 fracture, was taken to the OR with NSG on 38/10 -Uncomplicated postop course -Patient has been nonadherent to his c-collar -Pending SNF discharge Plan: Pain control p.o. meds as needed SNF placement pending Follow-up with neurosurgery 3 weeks  Type II diabetesmellituswith hyperglycemia --A1C 7.6 on 06/03/20 -Lantus dose 7 units twice daily insulin sliding scale moderate CBG improved with increased subcu insulin doses.  Dyslipidemia. -continue statin therapy.  Dementiawithout behavioral disturbance. No behavioral issues since admitted. Continue home Seroquel  Chronic depression Stable Continue home Zoloft  BPH. -continue Flomax. Monitor urine output  History of prostate cancer Not on home cancer medications   DVT prophylaxis. -Lovenox, subcu daily.  ResolvedRUQ pain unclear etiology UA negative for pyuria.    DVT  prophylaxis: Lovenox Code Status: Full Family Communication: None today Disposition Plan: Status is: Inpatient  Remains inpatient appropriate because:Unsafe d/c plan   Dispo:  Patient From: Home  Planned Disposition: [place]  Expected discharge date: 06/13/20  Medically stable for discharge: Yes  Patient currently postop day 10 status post C6/C7 operative fixation with neurosurgery.  Medically stable for discharge.  Unable to find accepting skilled nursing facility.  TOC involved.  Expanding source surrounding areas.  Discharge when bed is found.   Consultants:   Neurosurgery  Procedures:   C6/C7 operative fixation  Antimicrobials:   None   Subjective: Seen and examined.  No complaints.  Appears chronically ill.  Voice weak.  Objective: Vitals:   06/12/20 0733 06/12/20 1502 06/12/20 2218 06/13/20 0800  BP: (!) 144/86 (!) 150/86 (!) 141/85 (!) 174/91  Pulse: 93 91 85 80  Resp: 20 18 15 18   Temp: 98.2 F (36.8 C) 98.6 F (37 C) 98.5 F (36.9 C) 98.6 F (37 C)  TempSrc:   Oral Oral  SpO2: 99% 100% 92% 100%  Weight:      Height:       No intake or output data in the 24 hours ending 06/13/20 1056 Filed Weights   06/03/20 0121  Weight: 56.7 kg    Examination:  General exam: No acute distress.  Appears frail Respiratory system: Clear to auscultation. Respiratory effort normal. Cardiovascular system: S1 & S2 heard, RRR. No JVD, murmurs, rubs, gallops or clicks. No pedal edema. Gastrointestinal system: Abdomen is nondistended, soft and nontender. No organomegaly or masses felt. Normal bowel sounds heard. Central nervous system: Alert and oriented. No focal neurological deficits. Extremities: Symmetric 5 x 5 power. Skin: No rashes, lesions or ulcers Psychiatry: Judgement and insight appear normal. Mood & affect flattened.     Data Reviewed: I have personally reviewed  following labs and imaging studies  CBC: Recent Labs  Lab 06/09/20 0659  06/12/20 1230  WBC 8.7 9.1  NEUTROABS  --  6.8  HGB 14.1 13.7  HCT 42.7 41.0  MCV 87.9 85.4  PLT 158 128   Basic Metabolic Panel: Recent Labs  Lab 06/06/20 1245 06/09/20 0659 06/10/20 0550 06/12/20 1230  NA  --  142  --  132*  K  --  3.9  --  4.0  CL  --  105  --  94*  CO2  --  26  --  27  GLUCOSE 394* 104*  --  181*  BUN  --  22  --  19  CREATININE  --  0.88 0.95 0.92  CALCIUM  --  8.8*  --  8.9   GFR: Estimated Creatinine Clearance: 53.1 mL/min (by C-G formula based on SCr of 0.92 mg/dL). Liver Function Tests: No results for input(s): AST, ALT, ALKPHOS, BILITOT, PROT, ALBUMIN in the last 168 hours. No results for input(s): LIPASE, AMYLASE in the last 168 hours. No results for input(s): AMMONIA in the last 168 hours. Coagulation Profile: No results for input(s): INR, PROTIME in the last 168 hours. Cardiac Enzymes: No results for input(s): CKTOTAL, CKMB, CKMBINDEX, TROPONINI in the last 168 hours. BNP (last 3 results) No results for input(s): PROBNP in the last 8760 hours. HbA1C: No results for input(s): HGBA1C in the last 72 hours. CBG: Recent Labs  Lab 06/12/20 0732 06/12/20 1143 06/12/20 1619 06/12/20 2216 06/13/20 0755  GLUCAP 125* 179* 150* 194* 127*   Lipid Profile: No results for input(s): CHOL, HDL, LDLCALC, TRIG, CHOLHDL, LDLDIRECT in the last 72 hours. Thyroid Function Tests: No results for input(s): TSH, T4TOTAL, FREET4, T3FREE, THYROIDAB in the last 72 hours. Anemia Panel: No results for input(s): VITAMINB12, FOLATE, FERRITIN, TIBC, IRON, RETICCTPCT in the last 72 hours. Sepsis Labs: No results for input(s): PROCALCITON, LATICACIDVEN in the last 168 hours.  No results found for this or any previous visit (from the past 240 hour(s)).       Radiology Studies: No results found.      Scheduled Meds:  amLODipine  5 mg Oral Daily   Chlorhexidine Gluconate Cloth  6 each Topical Daily   enoxaparin (LOVENOX) injection  40 mg Subcutaneous  Q24H   insulin aspart  0-15 Units Subcutaneous TID WC   insulin aspart  0-5 Units Subcutaneous QHS   insulin glargine  7 Units Subcutaneous BID   pravastatin  20 mg Oral q1800   QUEtiapine  25 mg Oral QHS   sertraline  150 mg Oral Daily   tamsulosin  0.4 mg Oral Daily   Continuous Infusions:  sodium chloride 1,000 mL (06/04/20 1502)   methocarbamol (ROBAXIN) IV Stopped (06/04/20 0700)     LOS: 10 days    Time spent: 15 minutes    Sidney Ace, MD [facility] Pager [phone]  If 7PM-7AM, please contact night-coverage 06/13/2020, 10:56 AM

## 2020-06-13 NOTE — TOC Progression Note (Signed)
Transition of Care [facility]) - Progression Note    Patient Details  Name: Mitchell Stewart MRN: [id number] Date of Birth: Jul 11, 1941  Transition [facility] Care [facility]) CM/SW [place], RN Phone Number: 06/13/2020, 9:36 AM  Clinical Narrative:   RNCM received phone call from [facility] with [place] she is working on getting authorization and needs demographics sheet re-faxed. RNCM faxed facesheet to [phone].     Expected Discharge Plan: [place] Barriers to Discharge: No Barriers Identified  Expected Discharge Plan and Services Expected Discharge Plan: [place] Choice: [place] arrangements for the past 2 months: Single Family Home Expected Discharge Date: 06/10/20                                     Social Determinants of Health (SDOH) Interventions    Readmission Risk Interventions No flowsheet data found.

## 2020-06-13 NOTE — Progress Notes (Signed)
Physical Therapy Treatment Patient Details Name: Mitchell Stewart MRN: [id number] DOB: 1941/02/27 Today's Date: 06/13/2020    History of Present Illness Pt is a 79 y/o M with PMH: dementia, DM, HOH, HLD and prostate cancer who presented to [facility] ED s/p fall down flight of stairs. Pt found to have cervical spine fx and is now s/p C6-7 ACDF (06/03/2020).    PT Comments    Pt was asleep in supine upon arriving. He did not have cervical collar donned but was agreeable for placement. He was able to tolerate application of brace via log roll. Mod assist to roll L with increased time and vcs. Max assist to achieve EOB short sit and max assist to stand several times to RW. Could not safely progress away from EOB 2/2 to balance and safety concerns. Overall pt tolerated session well and is progressing however slowly 2/2 to cognition. Recommend DC to SNF to address deficits and improve safe functional mobility.    Follow Up Recommendations  SNF     Equipment Recommendations  Other (comment) (defer to next level of care)    Recommendations for Other Services       Precautions / Restrictions Precautions Precautions: Fall;Cervical Precaution Booklet Issued: No Precaution Comments: Brace on at all times when OOB(can be applied seated EOB per order) Required Braces or Orthoses: Cervical Brace Restrictions Weight Bearing Restrictions: No    Mobility  Bed Mobility Overal bed mobility: Needs Assistance Bed Mobility: Rolling;Sidelying to Sit;Supine to Sit Rolling: Mod assist Sidelying to sit: Max assist Supine to sit: Max assist Sit to supine: Max assist Sit to sidelying: Max assist General bed mobility comments: Pt was able to perform log roll L/R  Transfers Overall transfer level: Needs assistance Equipment used: Rolling walker (2 wheeled) Transfers: Sit to/from Stand Sit to Stand: Max assist;From elevated surface         General transfer comment: Pt performed STS 3 x EOB with max  assist + vcs. has R lateral posterior lean but is able to correct for several minutes prior to reverting to poor standing posture  Ambulation/Gait Ambulation/Gait assistance: Max assist Gait Distance (Feet): 3 Feet Assistive device: Rolling walker (2 wheeled)   Gait velocity: decreased   General Gait Details: was able to take lateral steps form FOB to [facility]. also able to perform standing marching atr EOB x 2 minutes. unable to motor plan for alternating marching         Balance Overall balance assessment: Needs assistance Sitting-balance support: Feet supported;Bilateral upper extremity supported Sitting balance-Leahy Scale: Poor Sitting balance - Comments: needs constant assistance to prevent R lateral posterior LOB   Standing balance support: Bilateral upper extremity supported;During functional activity Standing balance-Leahy Scale: Poor Standing balance comment: Reliant on BUE support + mod assist , post lean and no attempt to correct         Cognition Arousal/Alertness: Awake/alert Behavior During Therapy: WFL for tasks assessed/performed Overall Cognitive Status: History of cognitive impairments - at baseline        General Comments: Pt oriented to self only. He is overheard speaking to unseen others after therapist leaves room to gather supplies and returns. Pt easily re-directed to therapy session. He follows 1 step VCs with increased time and multimodal prompting. He is recieved with cervical brace removed.             Pertinent Vitals/Pain Pain Assessment: No/denies pain Pain Score: 0-No pain Faces Pain Scale: No hurt Pain Location: R side Pain Descriptors /  Indicators: Grimacing;Guarding Pain Intervention(s): Limited activity within patient's tolerance;Monitored during session;Premedicated before session;Repositioned           PT Goals (current goals can now be found in the care plan section) Acute Rehab PT Goals Patient Stated Goal: none stated Progress  towards PT goals: Progressing toward goals    Frequency    7X/week      PT Plan Current plan remains appropriate       AM-PAC PT "6 Clicks" Mobility   Outcome Measure  Help needed turning from your back to your side while in a flat bed without using bedrails?: A Lot Help needed moving from lying on your back to sitting on the side of a flat bed without using bedrails?: A Lot Help needed moving to and from a bed to a chair (including a wheelchair)?: A Lot Help needed standing up from a chair using your arms (e.g., wheelchair or bedside chair)?: A Lot Help needed to walk in hospital room?: A Lot Help needed climbing 3-5 steps with a railing? : Total 6 Click Score: 11    End of Session Equipment Utilized During Treatment: Gait belt Activity Tolerance: Patient tolerated treatment well;Patient limited by fatigue;Patient limited by pain Patient left: in bed;with call bell/phone within reach;with bed alarm set Nurse Communication: Mobility status PT Visit Diagnosis: Muscle weakness (generalized) (M62.81);Unsteadiness on feet (R26.81);History of falling (Z91.81)     Time: 4825-0037 PT Time Calculation (min) (ACUTE ONLY): 13 min  Charges:  $Therapeutic Activity: 8-22 mins                     Julaine Fusi PTA 06/13/20, 2:23 PM

## 2020-06-13 NOTE — Care Management Important Message (Signed)
Important Message  Patient Details  Name: Mitchell Stewart MRN: [id number] Date of Birth: July 28, 1941   Medicare Important Message Given:  Yes     Juliann Pulse A Rubye Strohmeyer 06/13/2020, 11:21 AM

## 2020-06-14 LAB — GLUCOSE, CAPILLARY
Glucose-Capillary: 150 mg/dL — ABNORMAL HIGH (ref 70–99)
Glucose-Capillary: 154 mg/dL — ABNORMAL HIGH (ref 70–99)
Glucose-Capillary: 129 mg/dL — ABNORMAL HIGH (ref 70–99)
Glucose-Capillary: 136 mg/dL — ABNORMAL HIGH (ref 70–99)

## 2020-06-14 MED ORDER — HALOPERIDOL LACTATE 5 MG/ML IJ SOLN: 2.5000 mg | Freq: Once | INTRAMUSCULAR | Status: DC

## 2020-06-14 NOTE — TOC Progression Note (Signed)
Transition of Care [facility]) - Progression Note    Patient Details  Name: Mitchell Stewart MRN: [id number] Date of Birth: [birth year]  Transition of Care [facility] CM/SW Contact  Shelbie Ammons, RN Phone Number: 06/14/2020, 9:39 AM  Clinical Narrative:   RNCM reached out to [facility] with [place] at [phone]. She reports that she has not yet received insurance authorization. The primary insurance agency initially reported that they were not his primary, she sent them the additional necessary information but has not yet heard back.  RNCM reached out to patient's son and POA [name] and provided phone number to insurance co so that he could call and attempt to clear up situation.     Expected Discharge Plan: [place] Barriers to Discharge: No Barriers Identified  Expected Discharge Plan and Services Expected Discharge Plan: [place] Choice: [place] arrangements for the past 2 months: Single Family Home Expected Discharge Date: 06/10/20                                     Social Determinants of Health (SDOH) Interventions    Readmission Risk Interventions No flowsheet data found.

## 2020-06-14 NOTE — Progress Notes (Signed)
Physical Therapy Treatment Patient Details Name: Mitchell Stewart MRN: [id number] DOB: [birth year] Today's Date: 06/14/2020    History of Present Illness Pt is a 79 y/o M with PMH: dementia, DM, HOH, HLD and prostate cancer who presented to [facility] ED s/p fall down flight of stairs. Pt found to have cervical spine fx and is now s/p C6-7 ACDF (06/03/2020).    PT Comments    Pt was supine in bed upon arriving. He is alert but continues to be confused and disoriented. He was able to exit R side of bed with max assist, stand to RW and ambulate ~ 12 ft. Pt did have successful BM during session on [facility] and RN is aware. Pt overall is progressing but has limited progress 2/2 to cognition deficits. Acute PT will continue to follow with recommendation to DC to SNF. Will benefit form continued skilled PT to improve independence.   Follow Up Recommendations  SNF     Equipment Recommendations  Other (comment) (defer to next level of care)    Recommendations for Other Services       Precautions / Restrictions Precautions Precautions: Fall;Cervical Precaution Booklet Issued: No Required Braces or Orthoses: Cervical Brace Cervical Brace: Hard collar ([place] ) Restrictions Weight Bearing Restrictions: No    Mobility  Bed Mobility Overal bed mobility: Needs Assistance Bed Mobility: Rolling;Sidelying to Sit;Supine to Sit Rolling: Max assist Sidelying to sit: Max assist Supine to sit: Max assist Sit to supine: Max assist Sit to sidelying: Max assist General bed mobility comments: max assist mostly for   Transfers Overall transfer level: Needs assistance Equipment used: Rolling walker (2 wheeled) Transfers: Sit to/from Stand Sit to Stand: Mod assist;From elevated surface            Ambulation/Gait Ambulation/Gait assistance: Mod assist;+2 safety/equipment Gait Distance (Feet): 12 Feet Assistive device: Rolling walker (2 wheeled) Gait Pattern/deviations: Scissoring;Trunk flexed;Drifts  right/left;Staggering right;Decreased step length - right;Decreased step length - left Gait velocity: decreased   General Gait Details: Pt was able to ambulate 12 ft around EOB with constant assistance and poor gait safety.         Balance Overall balance assessment: Needs assistance Sitting-balance support: Feet supported;Bilateral upper extremity supported Sitting balance-Leahy Scale: Fair Sitting balance - Comments: needs constant assistance to prevent R lateral posterior LOB Postural control: Right lateral lean Standing balance support: Bilateral upper extremity supported;During functional activity Standing balance-Leahy Scale: Poor       Cognition Arousal/Alertness: Awake/alert Behavior During Therapy: WFL for tasks assessed/performed Overall Cognitive Status: History of cognitive impairments - at baseline      General Comments: Pt was A but continues to be confused and disoriented. inconsistently follow commands             Pertinent Vitals/Pain Pain Assessment: No/denies pain Pain Score: 0-No pain Faces Pain Scale: No hurt Pain Location: R side Pain Descriptors / Indicators: Grimacing;Guarding Pain Intervention(s): Limited activity within patient's tolerance;Monitored during session;Premedicated before session           PT Goals (current goals can now be found in the care plan section) Acute Rehab PT Goals Patient Stated Goal: none stated Progress towards PT goals: Progressing toward goals    Frequency    7X/week      PT Plan Current plan remains appropriate       AM-PAC PT "6 Clicks" Mobility   Outcome Measure  Help needed turning from your back to your side while in a flat bed without using bedrails?: A Lot  Help needed moving from lying on your back to sitting on the side of a flat bed without using bedrails?: A Lot Help needed moving to and from a bed to a chair (including a wheelchair)?: A Lot Help needed standing up from a chair using your  arms (e.g., wheelchair or bedside chair)?: A Lot Help needed to walk in hospital room?: A Lot Help needed climbing 3-5 steps with a railing? : Total 6 Click Score: 11    End of Session Equipment Utilized During Treatment: Gait belt Activity Tolerance: Other (comment);Patient tolerated treatment well (limited by cognition) Patient left: in bed;with call bell/phone within reach;with bed alarm set Nurse Communication: Mobility status PT Visit Diagnosis: Muscle weakness (generalized) (M62.81);Unsteadiness on feet (R26.81);History of falling (Z91.81)     Time: 3570-1779 PT Time Calculation (min) (ACUTE ONLY): 32 min  Charges:  $Gait Training: 8-22 mins $Therapeutic Activity: 8-22 mins                     Julaine Fusi PTA 06/14/20, 1:06 PM

## 2020-06-14 NOTE — Progress Notes (Signed)
PROGRESS NOTE    Mitchell Stewart  WFU:[id number] DOB: 1941/07/11 DOA: 06/03/2020 PCP: Lesia Hausen, PA   Brief Narrative: Mitchell Stewart y.o.malewith a known history of type 2 diabetes mellitus, dyslipidemia,prostate cancer,dementia andchronicdepression, presented [facility] EDwith acute onset of accidental mechanical fall after getting up to go to the bathroom and falling down a flight of 15 stairs landing on his back on concrete with subsequent neck pain as well as bilateral elbow pain. Patient has significant dementia and unable toprovide ahistory orreview of systems.  Patient currently medically stable for discharge.  Pending placement.  He has been nonadherent with his cervical collar.  Neurosurgery aware.   Assessment & Plan:   Active Problems:   Traumatic fracture of cervical spine ([place])  AcuteC6-C7 traumatic spine fracture secondary to mechanical fallpost repair by neurosurgery on 06/03/20. -Continueimmobilizing his C-spine with neck collar until seen by neurosurgery outpatient. -C-spine MRI -- unstable C6-7 fracture, was taken to the OR with NSG on 20/25 -Uncomplicated postop course -Patient has been nonadherent to his c-collar -Pending SNF discharge Plan: Pain control p.o. meds as needed SNF placement pending Follow-up with neurosurgery 3 weeks  Type II diabetesmellituswith hyperglycemia --A1C 7.6 on 06/03/20 -Lantus dose 7 units twice daily insulin sliding scale moderate CBG improved with increased subcu insulin doses.  Dyslipidemia. -continue statin therapy.  Dementiawithout behavioral disturbance. No behavioral issues since admitted. Continue home Seroquel  Chronic depression Stable Continue home Zoloft  BPH. -continue Flomax. Monitor urine output  History of prostate cancer Not on home cancer medications   DVT prophylaxis. -Lovenox, subcu daily.  ResolvedRUQ pain unclear etiology UA negative for pyuria.    DVT  prophylaxis: Lovenox Code Status: Full Family Communication: None today Disposition Plan: Status is: Inpatient  Remains inpatient appropriate because:Unsafe d/c plan   Dispo:  Patient From: Home  Planned Disposition: [name]  Expected discharge date: 06/14/20  Medically stable for discharge: Yes  Patient currently postop day 10 status post C6/C7 operative fixation with neurosurgery.  Medically stable for discharge.  Unable to find accepting skilled nursing facility.  TOC involved.  Expanding source surrounding areas.  Discharge when bed is found.   Consultants:   Neurosurgery  Procedures:   C6/C7 operative fixation  Antimicrobials:   None   Subjective: Seen and examined.  No complaints this morning.  More awake and alert.  Does acknowledge some confusion.  Objective: Vitals:   06/13/20 0800 06/13/20 1626 06/14/20 0019 06/14/20 0736  BP: (!) 174/91 136/89 133/87 108/89  Pulse: 80 84 94 95  Resp: 18 20 18 20   Temp: 98.6 F (37 C) 98.3 F (36.8 C) 98.3 F (36.8 C) 97.9 F (36.6 C)  TempSrc: Oral Oral Oral Oral  SpO2: 100% 100% 93% 100%  Weight:      Height:        Intake/Output Summary (Last 24 hours) at 06/14/2020 4270 Last data filed at 06/13/2020 1902 Gross per 24 hour  Intake 120 ml  Output --  Net 120 ml   Filed Weights   06/03/20 0121  Weight: 56.7 kg    Examination:  General exam: No acute distress.  Appears frail Respiratory system: Clear to auscultation. Respiratory effort normal. Cardiovascular system: S1 & S2 heard, RRR. No JVD, murmurs, rubs, gallops or clicks. No pedal edema. Gastrointestinal system: Abdomen is nondistended, soft and nontender. No organomegaly or masses felt. Normal bowel sounds heard. Central nervous system: Alert and oriented. No focal neurological deficits. Extremities: Symmetric 5 x 5 power. Skin: No rashes, lesions  or ulcers Psychiatry: Judgement and insight appear normal. Mood & affect flattened.      Data Reviewed: I have personally reviewed following labs and imaging studies  CBC: Recent Labs  Lab 06/09/20 0659 06/12/20 1230  WBC 8.7 9.1  NEUTROABS  --  6.8  HGB 14.1 13.7  HCT 42.7 41.0  MCV 87.9 85.4  PLT 158 505   Basic Metabolic Panel: Recent Labs  Lab 06/09/20 0659 06/10/20 0550 06/12/20 1230  NA 142  --  132*  K 3.9  --  4.0  CL 105  --  94*  CO2 26  --  27  GLUCOSE 104*  --  181*  BUN 22  --  19  CREATININE 0.88 0.95 0.92  CALCIUM 8.8*  --  8.9   GFR: Estimated Creatinine Clearance: 53.1 mL/min (by C-G formula based on SCr of 0.92 mg/dL). Liver Function Tests: No results for input(s): AST, ALT, ALKPHOS, BILITOT, PROT, ALBUMIN in the last 168 hours. No results for input(s): LIPASE, AMYLASE in the last 168 hours. No results for input(s): AMMONIA in the last 168 hours. Coagulation Profile: No results for input(s): INR, PROTIME in the last 168 hours. Cardiac Enzymes: No results for input(s): CKTOTAL, CKMB, CKMBINDEX, TROPONINI in the last 168 hours. BNP (last 3 results) No results for input(s): PROBNP in the last 8760 hours. HbA1C: No results for input(s): HGBA1C in the last 72 hours. CBG: Recent Labs  Lab 06/13/20 0755 06/13/20 1159 06/13/20 1641 06/13/20 2116 06/14/20 0740  GLUCAP 127* 141* 95 179* 150*   Lipid Profile: No results for input(s): CHOL, HDL, LDLCALC, TRIG, CHOLHDL, LDLDIRECT in the last 72 hours. Thyroid Function Tests: No results for input(s): TSH, T4TOTAL, FREET4, T3FREE, THYROIDAB in the last 72 hours. Anemia Panel: No results for input(s): VITAMINB12, FOLATE, FERRITIN, TIBC, IRON, RETICCTPCT in the last 72 hours. Sepsis Labs: No results for input(s): PROCALCITON, LATICACIDVEN in the last 168 hours.  No results found for this or any previous visit (from the past 240 hour(s)).       Radiology Studies: No results found.      Scheduled Meds: . amLODipine  5 mg Oral Daily  . Chlorhexidine Gluconate Cloth  6  each Topical Daily  . enoxaparin (LOVENOX) injection  40 mg Subcutaneous Q24H  . insulin aspart  0-15 Units Subcutaneous TID WC  . insulin aspart  0-5 Units Subcutaneous QHS  . insulin glargine  7 Units Subcutaneous BID  . pravastatin  20 mg Oral q1800  . QUEtiapine  25 mg Oral QHS  . sertraline  150 mg Oral Daily  . tamsulosin  0.4 mg Oral Daily   Continuous Infusions: . sodium chloride 1,000 mL (06/04/20 1502)  . methocarbamol (ROBAXIN) IV Stopped (06/04/20 0700)     LOS: 11 days    Time spent: 15 minutes    Sidney Ace, MD [facility] Pager [phone]  If 7PM-7AM, please contact night-coverage 06/14/2020, 9:52 AM

## 2020-06-15 LAB — GLUCOSE, CAPILLARY
Glucose-Capillary: 184 mg/dL — ABNORMAL HIGH (ref 70–99)
Glucose-Capillary: 144 mg/dL — ABNORMAL HIGH (ref 70–99)
Glucose-Capillary: 176 mg/dL — ABNORMAL HIGH (ref 70–99)
Glucose-Capillary: 152 mg/dL — ABNORMAL HIGH (ref 70–99)

## 2020-06-15 MED ORDER — HALOPERIDOL LACTATE 5 MG/ML IJ SOLN
2.0000 mg | Freq: Three times a day (TID) | INTRAMUSCULAR | Status: DC | PRN
  Administered 2020-06-15 – 2020-06-19 (×3): 2 mg via INTRAMUSCULAR
  Filled 2020-06-15 (×4): qty 1

## 2020-06-15 MED ORDER — HALOPERIDOL 2 MG PO TABS
2.0000 mg | ORAL_TABLET | Freq: Three times a day (TID) | ORAL | Status: DC | PRN
  Filled 2020-06-15: qty 1

## 2020-06-15 NOTE — Progress Notes (Signed)
PROGRESS NOTE    Mitchell Stewart  ION:[id number] DOB: [birth year] DOA: 06/03/2020 PCP: Lesia Hausen, PA   Brief Narrative: Mitchell Stewart y.o.malewith a known history of type 2 diabetes mellitus, dyslipidemia,prostate cancer,dementia andchronicdepression, presented [facility] EDwith acute onset of accidental mechanical fall after getting up to go to the bathroom and falling down a flight of 15 stairs landing on his back on concrete with subsequent neck pain as well as bilateral elbow pain. Patient has significant dementia and unable toprovide ahistory orreview of systems.  Patient currently medically stable for discharge.  Pending placement.  He has been nonadherent with his cervical collar.  Neurosurgery aware.   Assessment & Plan:   Active Problems:   Traumatic fracture of cervical spine ([place])  AcuteC6-C7 traumatic spine fracture secondary to mechanical fallpost repair by neurosurgery on 06/03/20. -Continueimmobilizing his C-spine with neck collar until seen by neurosurgery outpatient. -C-spine MRI -- unstable C6-7 fracture, was taken to the OR with NSG on 24/40 -Uncomplicated postop course -Patient has been nonadherent to his c-collar -Pending SNF discharge Plan: Pain control p.o. meds as needed SNF placement pending Follow-up with neurosurgery 3 weeks  Type II diabetesmellituswith hyperglycemia --A1C 7.6 on 06/03/20 -Lantus dose 7 units twice daily insulin sliding scale moderate CBG improved with increased subcu insulin doses.  Dyslipidemia. -continue statin therapy.  Dementiawithout behavioral disturbance. No behavioral issues since admitted. Continue home Seroquel  Chronic depression Stable Continue home Zoloft  BPH. -continue Flomax. Monitor urine output  History of prostate cancer Not on home cancer medications   DVT prophylaxis. -Lovenox, subcu daily.  ResolvedRUQ pain unclear etiology UA negative for pyuria.    DVT  prophylaxis: Lovenox Code Status: Full Family Communication: None today Disposition Plan: Status is: Inpatient  Remains inpatient appropriate because:Unsafe d/c plan   Dispo:  Patient From: Home  Planned Disposition: [place]  Expected discharge date: 06/15/20  Medically stable for discharge: Yes  Patient currently postop day 11 status post C6/C7 operative fixation with neurosurgery.  Medically stable for discharge.  Unable to find accepting skilled nursing facility.  TOC involved.  As of 06/14/2020 SNF reports that they have not yet received insurance authorization.  Consultants:   Neurosurgery  Procedures:   C6/C7 operative fixation  Antimicrobials:   None   Subjective: Seen and examined.  Pleasant and communicative this morning.  Objective: Vitals:   06/14/20 0736 06/14/20 1531 06/14/20 2257 06/15/20 0750  BP: 108/89 128/81 (!) 140/97 125/81  Pulse: 95 98 95 85  Resp: 20 18 16 20   Temp: 97.9 F (36.6 C) 98.3 F (36.8 C) 97.7 F (36.5 C) 98 F (36.7 C)  TempSrc: Oral Oral Oral Oral  SpO2: 100% 99% 100% 99%  Weight:      Height:        Intake/Output Summary (Last 24 hours) at 06/15/2020 1016 Last data filed at 06/15/2020 1027 Gross per 24 hour  Intake 480 ml  Output 200 ml  Net 280 ml   Filed Weights   06/03/20 0121  Weight: 56.7 kg    Examination:  General exam: No acute distress.  Appears frail Respiratory system: Clear to auscultation. Respiratory effort normal. Cardiovascular system: S1 & S2 heard, RRR. No JVD, murmurs, rubs, gallops or clicks. No pedal edema. Gastrointestinal system: Abdomen is nondistended, soft and nontender. No organomegaly or masses felt. Normal bowel sounds heard. Central nervous system: Alert and oriented. No focal neurological deficits. Extremities: Symmetric 5 x 5 power. Skin: No rashes, lesions or ulcers Psychiatry: Judgement and insight  appear normal. Mood & affect flattened.     Data Reviewed:  I have personally reviewed following labs and imaging studies  CBC: Recent Labs  Lab 06/09/20 0659 06/12/20 1230  WBC 8.7 9.1  NEUTROABS  --  6.8  HGB 14.1 13.7  HCT 42.7 41.0  MCV 87.9 85.4  PLT 158 144   Basic Metabolic Panel: Recent Labs  Lab 06/09/20 0659 06/10/20 0550 06/12/20 1230  NA 142  --  132*  K 3.9  --  4.0  CL 105  --  94*  CO2 26  --  27  GLUCOSE 104*  --  181*  BUN 22  --  19  CREATININE 0.88 0.95 0.92  CALCIUM 8.8*  --  8.9   GFR: Estimated Creatinine Clearance: 53.1 mL/min (by C-G formula based on SCr of 0.92 mg/dL). Liver Function Tests: No results for input(s): AST, ALT, ALKPHOS, BILITOT, PROT, ALBUMIN in the last 168 hours. No results for input(s): LIPASE, AMYLASE in the last 168 hours. No results for input(s): AMMONIA in the last 168 hours. Coagulation Profile: No results for input(s): INR, PROTIME in the last 168 hours. Cardiac Enzymes: No results for input(s): CKTOTAL, CKMB, CKMBINDEX, TROPONINI in the last 168 hours. BNP (last 3 results) No results for input(s): PROBNP in the last 8760 hours. HbA1C: No results for input(s): HGBA1C in the last 72 hours. CBG: Recent Labs  Lab 06/14/20 0740 06/14/20 1139 06/14/20 1625 06/14/20 2148 06/15/20 0746  GLUCAP 150* 154* 129* 136* 184*   Lipid Profile: No results for input(s): CHOL, HDL, LDLCALC, TRIG, CHOLHDL, LDLDIRECT in the last 72 hours. Thyroid Function Tests: No results for input(s): TSH, T4TOTAL, FREET4, T3FREE, THYROIDAB in the last 72 hours. Anemia Panel: No results for input(s): VITAMINB12, FOLATE, FERRITIN, TIBC, IRON, RETICCTPCT in the last 72 hours. Sepsis Labs: No results for input(s): PROCALCITON, LATICACIDVEN in the last 168 hours.  No results found for this or any previous visit (from the past 240 hour(s)).       Radiology Studies: No results found.      Scheduled Meds: . amLODipine  5 mg Oral Daily  . Chlorhexidine Gluconate Cloth  6 each Topical Daily  .  enoxaparin (LOVENOX) injection  40 mg Subcutaneous Q24H  . haloperidol lactate  2.5 mg Intravenous Once  . insulin aspart  0-15 Units Subcutaneous TID WC  . insulin aspart  0-5 Units Subcutaneous QHS  . insulin glargine  7 Units Subcutaneous BID  . pravastatin  20 mg Oral q1800  . QUEtiapine  25 mg Oral QHS  . sertraline  150 mg Oral Daily  . tamsulosin  0.4 mg Oral Daily   Continuous Infusions: . sodium chloride 1,000 mL (06/04/20 1502)  . methocarbamol (ROBAXIN) IV Stopped (06/04/20 0700)     LOS: 12 days    Time spent: 15 minutes    Sidney Ace, MD [facility] Pager [phone]  If 7PM-7AM, please contact night-coverage 06/15/2020, 10:16 AM

## 2020-06-15 NOTE — Progress Notes (Signed)
Physical Therapy Treatment Patient Details Name: Mitchell Stewart MRN: [id number] DOB: August 05, 1941 Today's Date: 06/15/2020    History of Present Illness Pt is a 79 y/o M with PMH: dementia, DM, HOH, HLD and prostate cancer who presented to [facility] ED s/p fall down flight of stairs. Pt found to have cervical spine fx and is now s/p C6-7 ACDF (06/03/2020).    PT Comments    Pt was long sitting in bed with cervical collar donned upon arriving. Now has sitter 2/2 to pulling IV out and becoming more eager to get up. Pt is very confused and has difficulty following commands and being redirected.  He did exit R side of bed via log roll. Stood to [employer] with min assist from elevated bed height. Attempted to have BM on BSC without success. Ambulated ~ 15 ft with RW with very unstable unsafe gait kinematics. Mod assist to prevent fall. Pt was sitting up in recliner with sitter at bedside at conclusion of session.    Follow Up Recommendations  SNF     Equipment Recommendations  Other (comment) (defer to next level of care)    Recommendations for Other Services       Precautions / Restrictions Precautions Precautions: Fall;Cervical Precaution Booklet Issued: No Precaution Comments: Brace on at all times when OOB(can be applied seated EOB per order) Required Braces or Orthoses: Cervical Brace Cervical Brace: Hard collar ([place] collar) Restrictions Weight Bearing Restrictions: No    Mobility  Bed Mobility Overal bed mobility: Needs Assistance Bed Mobility: Rolling;Sidelying to Sit;Supine to Sit Rolling: Mod assist Sidelying to sit: Max assist Supine to sit: Max assist Sit to supine: Max assist   General bed mobility comments: Pt has difficulty with following commands. needs constant tactile cues and increased time to process  Transfers Overall transfer level: Needs assistance Equipment used: Rolling walker (2 wheeled) Transfers: Sit to/from Stand Sit to Stand: Min assist;Mod assist;From  elevated surface         General transfer comment: min assist to stand from slightly elevated surface. min-mod from BSC/recliner. He need constant vcs to stay focused on desired task.   Ambulation/Gait Ambulation/Gait assistance: Mod assist;+2 safety/equipment Gait Distance (Feet): 15 Feet Assistive device: Rolling walker (2 wheeled) Gait Pattern/deviations: Scissoring;Trunk flexed;Drifts right/left;Staggering right;Decreased step length - right;Decreased step length - left Gait velocity: decreased   General Gait Details: pt was able to ambulate ~ 15 ft with RW + mod assist to prevent fall. poor safety awareness and constant assist        Balance Overall balance assessment: Needs assistance;History of Falls Sitting-balance support: Feet supported;Bilateral upper extremity supported Sitting balance-Leahy Scale: Poor     Standing balance support: Bilateral upper extremity supported;During functional activity Standing balance-Leahy Scale: Poor         Cognition Arousal/Alertness: Awake/alert Behavior During Therapy: Impulsive;Restless Overall Cognitive Status: History of cognitive impairments - at baseline      General Comments: Pt much more alert today and very talkative however confused.  Hard to redirect and requires increased time to perform all desired task             Pertinent Vitals/Pain Pain Assessment: No/denies pain (unable to rate) Pain Location: R side Pain Descriptors / Indicators: Grimacing;Guarding Pain Intervention(s): Limited activity within patient's tolerance;Monitored during session;Premedicated before session;Repositioned           PT Goals (current goals can now be found in the care plan section) Acute Rehab PT Goals Patient Stated Goal: none stated Progress towards  PT goals: Progressing toward goals    Frequency    7X/week      PT Plan Current plan remains appropriate       AM-PAC PT "6 Clicks" Mobility   Outcome Measure   Help needed turning from your back to your side while in a flat bed without using bedrails?: A Lot Help needed moving from lying on your back to sitting on the side of a flat bed without using bedrails?: A Lot Help needed moving to and from a bed to a chair (including a wheelchair)?: A Lot Help needed standing up from a chair using your arms (e.g., wheelchair or bedside chair)?: A Lot Help needed to walk in hospital room?: A Lot Help needed climbing 3-5 steps with a railing? : Total 6 Click Score: 11    End of Session Equipment Utilized During Treatment: Gait belt Activity Tolerance: Patient tolerated treatment well Patient left: in chair;with call bell/phone within reach;with nursing/sitter in room;with chair alarm set Freight forwarder present) Nurse Communication: Mobility status PT Visit Diagnosis: Muscle weakness (generalized) (M62.81);Unsteadiness on feet (R26.81);History of falling (Z91.81)     Time: 1941-7408 PT Time Calculation (min) (ACUTE ONLY): 10 min  Charges:  $Therapeutic Activity: 8-22 mins                     Julaine Fusi PTA 06/15/20, 4:43 PM

## 2020-06-15 NOTE — TOC Progression Note (Signed)
Transition of Care [facility]) - Progression Note    Patient Details  Name: Mitchell Stewart MRN: [id number] Date of Birth: 1940-11-30  [facility] CM/SW Contact  Mitchell Price, RN Phone Number: 06/15/2020, 12:04 PM  Clinical Narrative:   Contacted son/POA re: insurance authorization issues. He stated his sisters were working on it were told to call back Monday. 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 [name] contact [place]. F/U Monday with son Mitchell Stewart at [place]    Expected Discharge Plan: [place] Barriers to Discharge: No Barriers Identified  Expected Discharge Plan and Services Expected Discharge Plan: [place] Choice: [place] arrangements for the past 2 months: Single Family Home Expected Discharge Date: 06/10/20                                     Social Determinants of Health (SDOH) Interventions    Readmission Risk Interventions No flowsheet data found.

## 2020-06-16 LAB — BASIC METABOLIC PANEL
Anion gap: 9 (ref 5–15)
BUN: 20 mg/dL (ref 8–23)
CO2: 27 mmol/L (ref 22–32)
Calcium: 9.2 mg/dL (ref 8.9–10.3)
Chloride: 99 mmol/L (ref 98–111)
Creatinine, Ser: 1.02 mg/dL (ref 0.61–1.24)
GFR, Estimated: >60 mL/min (ref >60)
Glucose, Bld: 138 mg/dL — ABNORMAL HIGH (ref 70–99)
Potassium: 4.6 mmol/L (ref 3.5–5.1)
Sodium: 135 mmol/L (ref 135–145)

## 2020-06-16 LAB — CBC WITH DIFFERENTIAL/PLATELET
Abs Immature Granulocytes: 0.09 10*3/uL — ABNORMAL HIGH (ref 0.00–0.07)
Basophils Absolute: 0.1 10*3/uL (ref 0.0–0.1)
Basophils Relative: 1 %
Eosinophils Absolute: 0.1 10*3/uL (ref 0.0–0.5)
Eosinophils Relative: 1 %
HCT: 42.1 % (ref 39.0–52.0)
Hemoglobin: 14.2 g/dL (ref 13.0–17.0)
Immature Granulocytes: 1 %
Lymphocytes Relative: 29 %
Lymphs Abs: 2 10*3/uL (ref 0.7–4.0)
MCH: 28.7 pg (ref 26.0–34.0)
MCHC: 33.7 g/dL (ref 30.0–36.0)
MCV: 85.2 fL (ref 80.0–100.0)
Monocytes Absolute: 0.5 10*3/uL (ref 0.1–1.0)
Monocytes Relative: 8 %
Neutro Abs: 4.2 10*3/uL (ref 1.7–7.7)
Neutrophils Relative %: 60 %
Platelets: 348 10*3/uL (ref 150–400)
RBC: 4.94 MIL/uL (ref 4.22–5.81)
RDW: 12.6 % (ref 11.5–15.5)
WBC: 6.9 10*3/uL (ref 4.0–10.5)
nRBC: 0 % (ref 0.0–0.2)

## 2020-06-16 LAB — GLUCOSE, CAPILLARY
Glucose-Capillary: 112 mg/dL — ABNORMAL HIGH (ref 70–99)
Glucose-Capillary: 211 mg/dL — ABNORMAL HIGH (ref 70–99)
Glucose-Capillary: 106 mg/dL — ABNORMAL HIGH (ref 70–99)
Glucose-Capillary: 224 mg/dL — ABNORMAL HIGH (ref 70–99)

## 2020-06-16 NOTE — Plan of Care (Signed)

## 2020-06-16 NOTE — Progress Notes (Signed)
Physical Therapy Treatment Patient Details Name: Mitchell Stewart MRN: [id number] DOB: May 24, 1941 Today's Date: 06/16/2020    History of Present Illness Pt is a 79 y/o M with PMH: dementia, DM, HOH, HLD and prostate cancer who presented to [facility] ED s/p fall down flight of stairs. Pt found to have cervical spine fx and is now s/p C6-7 ACDF (06/03/2020).    PT Comments    Sitter in room.  Ready for session.  Brace donned.  To EOB with mod/max a x 1 with cues.    Sitting with close supervision due to balance and unpredictable mobility.  Stood with min a x 1 to RW and pt is able to walk to commode and back to recliner with heavy cues and min a x 1 generally but max a x 1 when pt starts to loose his balance posteriorly and makes no attempt to correct.  Overall improved mobility but remains at high risk for falls due to cognition and balance.  Remained in recliner for lunch.   Follow Up Recommendations  SNF     Equipment Recommendations       Recommendations for Other Services       Precautions / Restrictions Precautions Precautions: Fall;Cervical Precaution Booklet Issued: No Precaution Comments: Brace on at all times when OOB(can be applied seated EOB per order) Required Braces or Orthoses: Cervical Brace Cervical Brace: Hard collar ([place] collar) Restrictions Weight Bearing Restrictions: No Other Position/Activity Restrictions: [place] J collar, HOB 30 degrees, okay per MD to take brace off temporaily to adjust brace    Mobility  Bed Mobility Overal bed mobility: Needs Assistance Bed Mobility: Rolling;Sidelying to Sit;Supine to Sit Rolling: Mod assist Sidelying to sit: Mod assist          Transfers Overall transfer level: Needs assistance Equipment used: Rolling walker (2 wheeled) Transfers: Sit to/from Stand Sit to Stand: Min assist;Mod assist;From elevated surface            Ambulation/Gait Ambulation/Gait assistance: Min assist;Mod assist Gait Distance (Feet): 10  Feet Assistive device: Rolling walker (2 wheeled) Gait Pattern/deviations: Scissoring;Trunk flexed;Drifts right/left;Staggering right;Decreased step length - right;Decreased step length - left Gait velocity: decreased   General Gait Details: walked to/from commode in room which is positioned against wall at end of bed.   Stairs             Wheelchair Mobility    Modified Rankin (Stroke Patients Only)       Balance Overall balance assessment: Needs assistance;History of Falls Sitting-balance support: Feet supported;Bilateral upper extremity supported Sitting balance-Leahy Scale: Poor     Standing balance support: Bilateral upper extremity supported;During functional activity Standing balance-Leahy Scale: Poor Standing balance comment: Reliant on BUE support + mod assist , post lean and no attempt to correct                            Cognition Arousal/Alertness: Awake/alert Behavior During Therapy: Impulsive;Restless Overall Cognitive Status: History of cognitive impairments - at baseline                                 General Comments: Pt much more alert today and very talkative however confused.  Hard to redirect and requires increased time to perform all desired task      Exercises      General Comments        Pertinent Vitals/Pain Pain  Assessment: No/denies pain    Home Living                      Prior Function            PT Goals (current goals can now be found in the care plan section) Progress towards PT goals: Progressing toward goals    Frequency    7X/week      PT Plan Current plan remains appropriate    Co-evaluation              AM-PAC PT "6 Clicks" Mobility   Outcome Measure  Help needed turning from your back to your side while in a flat bed without using bedrails?: A Lot Help needed moving from lying on your back to sitting on the side of a flat bed without using bedrails?: A  Lot Help needed moving to and from a bed to a chair (including a wheelchair)?: A Lot Help needed standing up from a chair using your arms (e.g., wheelchair or bedside chair)?: A Lot Help needed to walk in hospital room?: A Lot Help needed climbing 3-5 steps with a railing? : Total 6 Click Score: 11    End of Session Equipment Utilized During Treatment: Gait belt Activity Tolerance: Patient tolerated treatment well Patient left: in chair;with call bell/phone within reach;with nursing/sitter in room;with chair alarm set Nurse Communication: Mobility status       Time: 0315-9458 PT Time Calculation (min) (ACUTE ONLY): 12 min  Charges:  $Gait Training: 8-22 mins                    Chesley Noon, PTA 06/16/20, 12:15 PM

## 2020-06-16 NOTE — Progress Notes (Signed)
PROGRESS NOTE    Mitchell Stewart  GYJ:[id number] DOB: 1940-10-02 DOA: 06/03/2020 PCP: Lesia Hausen, PA   Brief Narrative: Mitchell Stewart y.o.malewith a known history of type 2 diabetes mellitus, dyslipidemia,prostate cancer,dementia andchronicdepression, [name] mechanical [facility] after getting up to go to the bathroom and falling down a flight of 15 stairs landing on his back on concrete with subsequent neck pain as well as bilateral elbow pain. Patient has significant dementia and unable toprovide ahistory orreview of systems.  Patient currently medically stable for discharge.  Pending placement.  He has been nonadherent with his cervical collar.  Neurosurgery aware.  Mental status has been waxing and waning.  Suspect hospital-acquired delirium.  Patient attempting to pull at lines and get up out of bed.  Sitter in place.   Assessment & Plan:   Active Problems:   Traumatic fracture of cervical spine (HCC)  Delirium Mental status has been poor last 48 hours Patient difficult to redirect Getting up out of bed, pulling at [employer] in place Plan: Continue one-to-one sitter for now As needed Haldol Remainder of psychiatric regimen unchanged Unfortunately patient will need to be sitter free for 24 hours prior to SNF disposition.  AcuteC6-C7 traumatic spine fracture secondary to mechanical fallpost repair by neurosurgery on 06/03/20. -Continueimmobilizing his C-spine with neck collar until seen by neurosurgery outpatient. -C-spine MRI -- unstable C6-7 fracture, was taken to the OR with NSG on 26/37 -Uncomplicated postop course -Patient has been nonadherent to his c-collar -Pending SNF discharge Plan: Pain control p.o. meds as needed SNF placement pending Follow-up with neurosurgery 3 weeks  Type II diabetesmellituswith hyperglycemia --A1C 7.6 on 06/03/20 -Lantus dose 7 units twice daily insulin sliding scale  moderate CBG improved with increased subcu insulin doses.  Dyslipidemia. -continue statin therapy.  Dementiawithout behavioral disturbance. No behavioral issues since admitted. Continue home Seroquel  Chronic depression Stable Continue home Zoloft  BPH. -continue Flomax. Monitor urine output  History of prostate cancer Not on home cancer medications   DVT prophylaxis. -Lovenox, subcu daily.  ResolvedRUQ pain unclear etiology UA negative for pyuria.    DVT prophylaxis: Lovenox Code Status: Full Family Communication: None today Disposition Plan: Status is: Inpatient  Remains inpatient appropriate because:Unsafe d/c plan   Dispo:  Patient From: Home  Planned Disposition: [place]  Expected discharge date: 06/15/20  Medically stable for discharge: Yes  Patient currently postop day 12 status post C6/C7 operative fixation with neurosurgery.  Medically stable for discharge.  Unable to find accepting skilled nursing facility.  TOC involved.  As of 06/15/2020 SNF reports that they have not yet received insurance authorization.  Patient is now delirious likely owing to protracted hospital stay superimposed on underlying dementia.  He has a one-to-one Actuary at bedside.  We will need to follow-up with case management tomorrow to determine whether or not sitter needs to be discontinued prior to disposition to skilled nursing facility.  Consultants:   Neurosurgery  Procedures:   C6/C7 operative fixation  Antimicrobials:   None   Subjective: Seen and examined.  Pleasantly confused this morning.  No complaints  Objective: Vitals:   06/14/20 2257 06/15/20 0750 06/15/20 1614 06/15/20 2335  BP: (!) 140/97 125/81 125/83 (!) 145/77  Pulse: 95 85 88 89  Resp: 16 20 19 17   Temp: 97.7 F (36.5 C) 98 F (36.7 C) 98.9 F (37.2 C) 98.4 F (36.9 C)  TempSrc: Oral Oral Oral Oral  SpO2: 100% 99% 98% 93%  Weight:  Height:         Intake/Output Summary (Last 24 hours) at 06/16/2020 0950 Last data filed at 06/16/2020 0644 Gross per 24 hour  Intake --  Output 850 ml  Net -850 ml   Filed Weights   06/03/20 0121  Weight: 56.7 kg    Examination:  General exam: No acute distress.  Appears frail Respiratory system: Clear to auscultation. Respiratory effort normal. Cardiovascular system: S1 & S2 heard, RRR. No JVD, murmurs, rubs, gallops or clicks. No pedal edema. Gastrointestinal system: Abdomen is nondistended, soft and nontender. No organomegaly or masses felt. Normal bowel sounds heard. Central nervous system: Alert and oriented. No focal neurological deficits. Extremities: Symmetric 5 x 5 power. Skin: No rashes, lesions or ulcers Psychiatry: Judgement and insight appear normal. Mood & affect flattened.     Data Reviewed: I have personally reviewed following labs and imaging studies  CBC: Recent Labs  Lab 06/12/20 1230 06/16/20 0725  WBC 9.1 6.9  NEUTROABS 6.8 4.2  HGB 13.7 14.2  HCT 41.0 42.1  MCV 85.4 85.2  PLT 277 578   Basic Metabolic Panel: Recent Labs  Lab 06/10/20 0550 06/12/20 1230 06/16/20 0725  NA  --  132* 135  K  --  4.0 4.6  CL  --  94* 99  CO2  --  27 27  GLUCOSE  --  181* 138*  BUN  --  19 20  CREATININE 0.95 0.92 1.02  CALCIUM  --  8.9 9.2   GFR: Estimated Creatinine Clearance: 47.9 mL/min (by C-G formula based on SCr of 1.02 mg/dL). Liver Function Tests: No results for input(s): AST, ALT, ALKPHOS, BILITOT, PROT, ALBUMIN in the last 168 hours. No results for input(s): LIPASE, AMYLASE in the last 168 hours. No results for input(s): AMMONIA in the last 168 hours. Coagulation Profile: No results for input(s): INR, PROTIME in the last 168 hours. Cardiac Enzymes: No results for input(s): CKTOTAL, CKMB, CKMBINDEX, TROPONINI in the last 168 hours. BNP (last 3 results) No results for input(s): PROBNP in the last 8760 hours. HbA1C: No results for input(s): HGBA1C in  the last 72 hours. CBG: Recent Labs  Lab 06/15/20 0746 06/15/20 1151 06/15/20 1652 06/15/20 2051 06/16/20 0819  GLUCAP 184* 144* 176* 152* 112*   Lipid Profile: No results for input(s): CHOL, HDL, LDLCALC, TRIG, CHOLHDL, LDLDIRECT in the last 72 hours. Thyroid Function Tests: No results for input(s): TSH, T4TOTAL, FREET4, T3FREE, THYROIDAB in the last 72 hours. Anemia Panel: No results for input(s): VITAMINB12, FOLATE, FERRITIN, TIBC, IRON, RETICCTPCT in the last 72 hours. Sepsis Labs: No results for input(s): PROCALCITON, LATICACIDVEN in the last 168 hours.  No results found for this or any previous visit (from the past 240 hour(s)).       Radiology Studies: No results found.      Scheduled Meds:  amLODipine  5 mg Oral Daily   Chlorhexidine Gluconate Cloth  6 each Topical Daily   enoxaparin (LOVENOX) injection  40 mg Subcutaneous Q24H   haloperidol lactate  2.5 mg Intravenous Once   insulin aspart  0-15 Units Subcutaneous TID WC   insulin aspart  0-5 Units Subcutaneous QHS   insulin glargine  7 Units Subcutaneous BID   pravastatin  20 mg Oral q1800   QUEtiapine  25 mg Oral QHS   sertraline  150 mg Oral Daily   tamsulosin  0.4 mg Oral Daily   Continuous Infusions:  sodium chloride 1,000 mL (06/04/20 1502)   methocarbamol (ROBAXIN) IV Stopped (06/04/20  0700)     LOS: 13 days    Time spent: 15 minutes    Sidney Ace, MD [facility] Pager [phone]  If 7PM-7AM, please contact night-coverage 06/16/2020, 9:50 AM

## 2020-06-17 LAB — GLUCOSE, CAPILLARY
Glucose-Capillary: 113 mg/dL — ABNORMAL HIGH (ref 70–99)
Glucose-Capillary: 293 mg/dL — ABNORMAL HIGH (ref 70–99)
Glucose-Capillary: 247 mg/dL — ABNORMAL HIGH (ref 70–99)

## 2020-06-17 LAB — CREATININE, SERUM
Creatinine, Ser: 1.01 mg/dL (ref 0.61–1.24)
GFR, Estimated: >60 mL/min (ref >60)

## 2020-06-17 NOTE — Progress Notes (Signed)
Haldol effective. Patient laying in bed calm.

## 2020-06-17 NOTE — Progress Notes (Signed)
   06/17/20 1800  Clinical Encounter Type  Visited With Patient and family together  Visit Type Initial  Referral From Nurse  Consult/Referral To [facility] was page to visit patient he was agitated. Chaplain arrived and made her pastoral presence known. Son was at patient bedside. Chaplain presented active listening and empathy while visiting with patient and son. Patient was at peace while visiting.

## 2020-06-17 NOTE — Progress Notes (Signed)
PROGRESS NOTE    Mitchell Stewart  ZRA:[id number] DOB: May 21, 1941 DOA: 06/03/2020 PCP: Lesia Hausen, PA   Brief Narrative: Mitchell Stewart y.o.malewith a known history of type 2 diabetes mellitus, dyslipidemia,prostate cancer,dementia andchronicdepression, [facility] after getting up to go to the bathroom and falling down a flight of 15 stairs landing on his back on concrete with subsequent neck pain as well as bilateral elbow pain. Patient has significant dementia and unable toprovide ahistory orreview of systems.  Patient currently medically stable for discharge.  Pending placement.  He has been nonadherent with his cervical collar.  Neurosurgery aware.  Mental status has been waxing and waning.  Suspect hospital-acquired delirium.  Patient attempting to pull at lines and get up out of bed.  Sitter in place.    Assessment & Plan:   Active Problems:   Traumatic fracture of cervical spine (HCC)  Delirium Mental status has been poor last 48 hours Patient difficult to redirect Getting up out of bed, pulling at lines Sitter in place 10/25: Per bedside sitter this morning patient relatively calm Plan: Continue one-to-one sitter for now As needed Haldol Communicated with bedside RN.  Will need to reevaluate this patient during the course of the day today to determine safe use of sitter discontinuation.  Patient needs 24-hour sitter free prior to SNF disposition.  AcuteC6-C7 traumatic spine fracture secondary to mechanical fallpost repair by neurosurgery on 06/03/20. -Continueimmobilizing his C-spine with neck collar until seen by neurosurgery outpatient. -C-spine MRI -- unstable C6-7 fracture, was taken to the OR with NSG on 54/56 -Uncomplicated postop course -Patient has been nonadherent to his c-collar -Pending SNF discharge Plan: Pain control p.o. meds as needed SNF placement pending Follow-up with  neurosurgery 3 weeks post discharge  Type II diabetesmellituswith hyperglycemia --A1C 7.6 on 06/03/20 -Lantus dose 7 units twice daily insulin sliding scale moderate CBG improved with increased subcu insulin doses.  Dyslipidemia. -continue statin therapy.  Dementiawithout behavioral disturbance. No behavioral issues since admitted. Continue home Seroquel  Chronic depression Stable Continue home Zoloft  BPH. -continue Flomax. Monitor urine output  History of prostate cancer Not on home cancer medications   DVT prophylaxis. -Lovenox, subcu daily.  ResolvedRUQ pain unclear etiology UA negative for pyuria.    DVT prophylaxis: Lovenox Code Status: Full Family Communication: None today Disposition Plan: Status is: Inpatient  Remains inpatient appropriate because:Unsafe d/c plan   Dispo:  Patient From: Home  Planned Disposition: [place]  Expected discharge date: 06/18/20  Medically stable for discharge: Yes  Patient currently postop day 13 status post C6/C7 operative fixation with neurosurgery.  Medically stable for discharge.  Unable to find accepting skilled nursing facility.  TOC involved.  As of 06/15/2020 SNF reports that they have not yet received insurance authorization.  Patient is now delirious likely owing to protracted hospital stay superimposed on underlying dementia.  He has a one-to-one Actuary at bedside.  Will attempt to discontinue sitter within the next day to ensure medical readiness for discharge  Consultants:   Neurosurgery  Procedures:   C6/C7 operative fixation  Antimicrobials:   None   Subjective: Seen and examined.  Pleasantly confused this morning.  No complaints  Objective: Vitals:   06/15/20 1614 06/15/20 2335 06/16/20 2329 06/17/20 0755  BP: 125/83 (!) 145/77 (!) 151/92 (!) 142/93  Pulse: 88 89 88 95  Resp: 19 17 18 16   Temp: 98.9 F (37.2 C) 98.4 F (36.9 C) 97.7 F (36.5 C) 98.6  F (37  C)  TempSrc: Oral Oral Oral   SpO2: 98% 93% 99% 100%  Weight:      Height:        Intake/Output Summary (Last 24 hours) at 06/17/2020 1029 Last data filed at 06/17/2020 1012 Gross per 24 hour  Intake 720 ml  Output 500 ml  Net 220 ml   Filed Weights   06/03/20 0121  Weight: 56.7 kg    Examination:  General exam: No acute distress.  Appears frail Respiratory system: Clear to auscultation. Respiratory effort normal. Cardiovascular system: S1 & S2 heard, RRR. No JVD, murmurs, rubs, gallops or clicks. No pedal edema. Gastrointestinal system: Abdomen is nondistended, soft and nontender. No organomegaly or masses felt. Normal bowel sounds heard. Central nervous system: Alert and oriented. No focal neurological deficits. Extremities: Symmetric 5 x 5 power. Skin: No rashes, lesions or ulcers Psychiatry: Judgement and insight appear normal. Mood & affect flattened.     Data Reviewed: I have personally reviewed following labs and imaging studies  CBC: Recent Labs  Lab 06/12/20 1230 06/16/20 0725  WBC 9.1 6.9  NEUTROABS 6.8 4.2  HGB 13.7 14.2  HCT 41.0 42.1  MCV 85.4 85.2  PLT 277 355   Basic Metabolic Panel: Recent Labs  Lab 06/12/20 1230 06/16/20 0725 06/17/20 0345  NA 132* 135  --   K 4.0 4.6  --   CL 94* 99  --   CO2 27 27  --   GLUCOSE 181* 138*  --   BUN 19 20  --   CREATININE 0.92 1.02 1.01  CALCIUM 8.9 9.2  --    GFR: Estimated Creatinine Clearance: 48.3 mL/min (by C-G formula based on SCr of 1.01 mg/dL). Liver Function Tests: No results for input(s): AST, ALT, ALKPHOS, BILITOT, PROT, ALBUMIN in the last 168 hours. No results for input(s): LIPASE, AMYLASE in the last 168 hours. No results for input(s): AMMONIA in the last 168 hours. Coagulation Profile: No results for input(s): INR, PROTIME in the last 168 hours. Cardiac Enzymes: No results for input(s): CKTOTAL, CKMB, CKMBINDEX, TROPONINI in the last 168 hours. BNP (last 3 results) No results for  input(s): PROBNP in the last 8760 hours. HbA1C: No results for input(s): HGBA1C in the last 72 hours. CBG: Recent Labs  Lab 06/16/20 0819 06/16/20 1151 06/16/20 1644 06/16/20 2113 06/17/20 0758  GLUCAP 112* 211* 106* 224* 113*   Lipid Profile: No results for input(s): CHOL, HDL, LDLCALC, TRIG, CHOLHDL, LDLDIRECT in the last 72 hours. Thyroid Function Tests: No results for input(s): TSH, T4TOTAL, FREET4, T3FREE, THYROIDAB in the last 72 hours. Anemia Panel: No results for input(s): VITAMINB12, FOLATE, FERRITIN, TIBC, IRON, RETICCTPCT in the last 72 hours. Sepsis Labs: No results for input(s): PROCALCITON, LATICACIDVEN in the last 168 hours.  No results found for this or any previous visit (from the past 240 hour(s)).       Radiology Studies: No results found.      Scheduled Meds: . amLODipine  5 mg Oral Daily  . Chlorhexidine Gluconate Cloth  6 each Topical Daily  . enoxaparin (LOVENOX) injection  40 mg Subcutaneous Q24H  . haloperidol lactate  2.5 mg Intravenous Once  . insulin aspart  0-15 Units Subcutaneous TID WC  . insulin aspart  0-5 Units Subcutaneous QHS  . insulin glargine  7 Units Subcutaneous BID  . pravastatin  20 mg Oral q1800  . QUEtiapine  25 mg Oral QHS  . sertraline  150 mg Oral Daily  . tamsulosin  0.4 mg Oral Daily   Continuous Infusions: . sodium chloride 1,000 mL (06/04/20 1502)  . methocarbamol (ROBAXIN) IV Stopped (06/04/20 0700)     LOS: 14 days    Time spent: 15 minutes    Sidney Ace, MD [facility] Pager [phone]  If 7PM-7AM, please contact night-coverage 06/17/2020, 10:29 AM

## 2020-06-17 NOTE — Evaluation (Signed)
Occupational Therapy Evaluation Patient Details Name: Mitchell Stewart MRN: [id number] DOB: 05-31-1941 Today's Date: 06/17/2020    History of Present Illness Pt is a 79 y/o M with PMH: dementia, DM, HOH, HLD and prostate cancer who presented to [facility] ED s/p fall down flight of stairs. Pt found to have cervical spine fx and is now s/p C6-7 ACDF (06/03/2020).   Clinical Impression   Mitchell Stewart continues to display reduced endurance, strength, and balance and a decline in functional mobility. He has made slow but steady progress since admission, and his goals of care remain appropriate, per today's re-evaluation. Mitchell Stewart was pleasant, laughing, and eager to engage during today's session. He required Mod A and VCs to come to standing from recliner, using RW. Mr Stewart was able to march in place while standing but required VCs for each step (e.g., "lift your right foot. OK, now lift your left foot. Let's do it again: lift your right foot," etc.). Ambulated in room with RW and occasional LOB, close supervision and repeated VCs necessary to direct pt back to recliner. Pt expressed thirst, required tactile and VCs to lift glass and suck on straw. Able to self-feed but with much dropped food. Therapist provided education re: importance of wearing cervical collar, with little evidence of pt understanding: continuing education warranted. Pt left in recliner, alarm bell activated, call bell within reach, and pt stating that he was "very comfortable." Mitchell Stewart will continue to benefit from OT services while hospitalized. DC to SNF remains appropriate.    Follow Up Recommendations  SNF;Supervision/Assistance - 24 hour    Equipment Recommendations       Recommendations for Other Services       Precautions / Restrictions Precautions Precautions: Fall;Cervical Precaution Booklet Issued: No Precaution Comments: Brace on at all times when OOB(can be applied seated EOB per order) Required Braces or Orthoses:  Cervical Brace Cervical Brace: Hard collar Restrictions Weight Bearing Restrictions: No Other Position/Activity Restrictions: [place] J collar, HOB 30 degrees, okay per MD to take brace off temporaily to adjust brace      Mobility Bed Mobility Overal bed mobility: Needs Assistance Bed Mobility: Sidelying to Sit;Rolling Rolling: Min assist Sidelying to sit: Min assist       General bed mobility comments: good effort today and improved mobility    Transfers Overall transfer level: Needs assistance Equipment used: Rolling walker (2 wheeled) Transfers: Sit to/from Stand Sit to Stand: Mod assist Stand pivot transfers: Mod assist       General transfer comment: Mod A to stand from recliner and EOB. Requires frequent tactile and verbal cues.    Balance Overall balance assessment: Needs assistance;History of Falls Sitting-balance support: Feet supported;Bilateral upper extremity supported Sitting balance-Leahy Scale: Fair Sitting balance - Comments: close supervision for safety   Standing balance support: Bilateral upper extremity supported;During functional activity Standing balance-Leahy Scale: Fair Standing balance comment: Reliant on BUE support + mod assist overall                           ADL either performed or assessed with clinical judgement   ADL Overall ADL's : Needs assistance/impaired Eating/Feeding: Minimal assistance Eating/Feeding Details (indicate cue type and reason): tactile, verbal cues for initiating                                   General ADL Comments: Mod A  for bed mobility and ADL transfers     Vision Baseline Vision/History: Cataracts Patient Visual Report: No change from baseline       Perception     Praxis      Pertinent Vitals/Pain Pain Assessment: No/denies pain Faces Pain Scale: Hurts a little bit Pain Location: R side Pain Descriptors / Indicators: Guarding     Hand Dominance     Extremity/Trunk  Assessment Upper Extremity Assessment Upper Extremity Assessment: Generalized weakness RUE Deficits / Details: shld flexion to 1/2 range, MMT not assessed d/t pain/precautions. Grip MMT grossly 4-/5 LUE Deficits / Details: shld flexion to 1/3 range, MMT not assessed d/t pain/precautions. Grip MMT grossly 3+/5. Increased grimmacing with attempts to move L UE. Apparently fell to L side.   Lower Extremity Assessment Lower Extremity Assessment: Generalized weakness   Cervical / Trunk Assessment Cervical / Trunk Assessment: Kyphotic   Communication Communication Communication: HOH   Cognition Arousal/Alertness: Awake/alert Behavior During Therapy: WFL for tasks assessed/performed Overall Cognitive Status: History of cognitive impairments - at baseline                                 General Comments: alert, pleasant, laughing during today's session   General Comments       Exercises Other Exercises Other Exercises: Pt performed standing marching in place X 1 minute, with VCs required throughout   Shoulder Instructions      Home Living Family/patient expects to be discharged to:: Private residence Living Arrangements: Children Available Help at Discharge: Family;Available PRN/intermittently Type of Home: House Home Access: Stairs to enter [employer] of Steps: 2 Entrance Stairs-Rails: None Home Layout: Two level;Bed/bath upstairs Alternate Level Stairs-Number of Steps: 15 Alternate Level Stairs-Rails: Right Bathroom Shower/Tub: Teacher, early years/pre: Standard     Home Equipment: Environmental consultant - 4 wheels;Shower seat          Prior Functioning/Environment Level of Independence: Needs assistance  Gait / Transfers Assistance Needed: "shuffling" walking pattern per son-Arvle Jr. Son bought Rollator, but pt not really using. Requires CGA for mobility up stairs. Son barricaded stairs, but pt somehow removed heavy barricade leading to this  fall. ADL's / Homemaking Assistance Needed: Some intermittent assist for bathing/dressing d/t waxing/waning cognition, but can usually dress himself. Son indicates pt should be helped with bathing for safety, but often declines assistance. Son assists for all [facility] IADLs such as cooking/cleaning.   Comments: >5 falls in 2 weeks.        OT Problem List: Decreased strength;Decreased range of motion;Decreased activity tolerance;Impaired balance (sitting and/or standing);Impaired vision/perception;Decreased cognition;Decreased safety awareness;Decreased knowledge of use of DME or AE;Decreased knowledge of precautions;Impaired UE functional use;Pain      OT Treatment/Interventions: Self-care/ADL training;DME and/or AE instruction;Therapeutic activities;Balance training;Therapeutic exercise;Patient/family education    OT Goals(Current goals can be found in the care plan section) Acute Rehab OT Goals Patient Stated Goal: none stated Time For Goal Achievement: 07/01/20  OT Frequency: Min 1X/week   Barriers to D/C: Decreased caregiver support;Inaccessible home environment          Co-evaluation              AM-PAC OT "6 Clicks" Daily Activity     Outcome Measure Help from another person eating meals?: A Little Help from another person taking care of personal grooming?: A Lot Help from another person toileting, which includes using toliet, bedpan, or urinal?: A Lot Help from another  person bathing (including washing, rinsing, drying)?: A Lot Help from another person to put on and taking off regular upper body clothing?: A Lot Help from another person to put on and taking off regular lower body clothing?: A Lot 6 Click Score: 13   End of Session Equipment Utilized During Treatment: Cervical collar;Gait belt;Rolling walker  Activity Tolerance: Patient tolerated treatment well Patient left: in chair;with call bell/phone within reach;with chair alarm set  OT Visit Diagnosis: Unsteadiness  on feet (R26.81);Other abnormalities of gait and mobility (R26.89);Repeated falls (R29.6);Muscle weakness (generalized) (M62.81);History of falling (Z91.81)                Time: 1747-1595 OT Time Calculation (min): 34 min Charges:  OT General Charges $OT Visit: 1 Visit OT Evaluation $OT Re-eval: 1 Re-eval OT Treatments $Self Care/Home Management : 23-37 mins  Josiah Lobo, PhD, MS, OTR/L ascom [phone] 06/17/20, 4:05 PM

## 2020-06-17 NOTE — Progress Notes (Signed)
Patient is in chair. He is agitated and disoriented. Refusing assessment. Refusing to be moved back into the bed. Safety sitter present at bedside. Will continue to monitor.

## 2020-06-17 NOTE — Progress Notes (Signed)
Pt was pleasant this AM. Safety sitter was DC at 12nn today. At [place], pt was seen in the chair, refused to be move back in bed, agitated and disoriented. Refused VS and sugar check. Charge nurse and MD were notified. Safety sitter was re-ordered.

## 2020-06-17 NOTE — Progress Notes (Signed)
Physical Therapy Treatment Patient Details Name: Mitchell Stewart MRN: [id number] DOB: Dec 08, 1940 Today's Date: 06/17/2020    History of Present Illness Pt is a 79 y/o M with PMH: dementia, DM, HOH, HLD and prostate cancer who presented to [facility] ED s/p fall down flight of stairs. Pt found to have cervical spine fx and is now s/p C6-7 ACDF (06/03/2020).    PT Comments    Pt awake and engaged today.  Very interactive joking and singing during session.  Neck brace donned.  Rolling and transitioned to sitting with min a x 1.  Improved sitting balance today but supervision still provided for safety.  Stood with min a x 1 and pt able to walk 20' then 30' x 1 in circles in room with RW and min a x 1.  Overall improved balance today with no post lean.  He continues to need cues for safety and walker use.  Continues to need hands on assist at all times due to continued balance deficits and safety.   Follow Up Recommendations  SNF     Equipment Recommendations       Recommendations for Other Services       Precautions / Restrictions Precautions Precautions: Fall;Cervical Precaution Booklet Issued: No Precaution Comments: Brace on at all times when OOB(can be applied seated EOB per order) Required Braces or Orthoses: Cervical Brace Cervical Brace: Hard collar ([place] collar) Restrictions Weight Bearing Restrictions: No Other Position/Activity Restrictions: [place] J collar, HOB 30 degrees, okay per MD to take brace off temporaily to adjust brace    Mobility  Bed Mobility Overal bed mobility: Needs Assistance Bed Mobility: Sidelying to Sit;Rolling Rolling: Min assist Sidelying to sit: Min assist       General bed mobility comments: good effort today and improved mobility  Transfers Overall transfer level: Needs assistance Equipment used: Rolling walker (2 wheeled) Transfers: Sit to/from Stand Sit to Stand: Min assist;Mod assist;From elevated surface             Ambulation/Gait Ambulation/Gait assistance: Herbalist (Feet): 50 Feet Assistive device: Rolling walker (2 wheeled) Gait Pattern/deviations: Step-through pattern;Decreased step length - right;Decreased step length - left;Trunk flexed;Narrow base of support Gait velocity: decreased   General Gait Details: 1 lap then 2 laps in room with min a x 1 and overall improved gait and balance   Stairs             Wheelchair Mobility    Modified Rankin (Stroke Patients Only)       Balance Overall balance assessment: Needs assistance;History of Falls Sitting-balance support: Feet supported;Bilateral upper extremity supported Sitting balance-Leahy Scale: Fair Sitting balance - Comments: close supervision for safety   Standing balance support: Bilateral upper extremity supported;During functional activity Standing balance-Leahy Scale: Poor Standing balance comment: Reliant on BUE support + mod assist overall improved balance                            Cognition Arousal/Alertness: Awake/alert Behavior During Therapy: WFL for tasks assessed/performed Overall Cognitive Status: History of cognitive impairments - at baseline                                 General Comments: alert joking and singing songs today.      Exercises      General Comments        Pertinent Vitals/Pain Pain Assessment: No/denies  pain    Home Living                      Prior Function            PT Goals (current goals can now be found in the care plan section) Progress towards PT goals: Progressing toward goals    Frequency    7X/week      PT Plan Current plan remains appropriate    Co-evaluation              AM-PAC PT "6 Clicks" Mobility   Outcome Measure  Help needed turning from your back to your side while in a flat bed without using bedrails?: A Little Help needed moving from lying on your back to sitting on the side of a  flat bed without using bedrails?: A Little Help needed moving to and from a bed to a chair (including a wheelchair)?: A Little Help needed standing up from a chair using your arms (e.g., wheelchair or bedside chair)?: A Little Help needed to walk in hospital room?: A Little Help needed climbing 3-5 steps with a railing? : A Lot 6 Click Score: 17    End of Session Equipment Utilized During Treatment: Gait belt Activity Tolerance: Patient tolerated treatment well Patient left: in chair;with call bell/phone within reach;with nursing/sitter in room;with chair alarm set Nurse Communication: Mobility status       Time: 3612-2449 PT Time Calculation (min) (ACUTE ONLY): 17 min  Charges:  $Gait Training: 8-22 mins                    Chesley Noon, PTA 06/17/20, 12:48 PM

## 2020-06-17 NOTE — Progress Notes (Signed)
Patient noncompliant, very agitated. Haldol given.

## 2020-06-17 NOTE — TOC Progression Note (Signed)
Transition of Care [facility]) - Progression Note    Patient Details  Name: Mitchell Stewart MRN: [id number] Date of Birth: 1941/07/06  Transition of Care [facility]) CM/SW [place], RN Phone Number: 06/17/2020, 4:10 PM  Clinical Narrative:   RNCM touched base with Mitchell Stewart from [employer] and patient's son Mitchell Stewart throughout the day. Received return call from Mitchell Stewart at 3:45pm that she had just received insurance authorization for patient. Discussed planning transport for Tuesday. RNCM notified patient's son and he will come for visit, notified MD and rapid Covid will be ordered.  RNCM reached out to [facility] and they will be able to schedule transport for 11am Tuesday.     Expected Discharge Plan: Mitchell Stewart Barriers to Discharge: No Barriers Identified  Expected Discharge Plan and Services Expected Discharge Plan: Mitchell Stewart Choice: [place] arrangements for the past 2 months: Single Family Home Expected Discharge Date: 06/10/20                                     Social Determinants of Health (SDOH) Interventions    Readmission Risk Interventions No flowsheet data found.

## 2020-06-18 DIAGNOSIS — R410 Disorientation, unspecified: Secondary | ICD-10-CM

## 2020-06-18 LAB — GLUCOSE, CAPILLARY
Glucose-Capillary: 126 mg/dL — ABNORMAL HIGH (ref 70–99)
Glucose-Capillary: 187 mg/dL — ABNORMAL HIGH (ref 70–99)
Glucose-Capillary: 213 mg/dL — ABNORMAL HIGH (ref 70–99)
Glucose-Capillary: 123 mg/dL — ABNORMAL HIGH (ref 70–99)

## 2020-06-18 NOTE — Progress Notes (Signed)
PROGRESS NOTE    Mitchell Stewart  KWI:[id number] DOB: Dec 20, 1940 DOA: 06/03/2020 PCP: Lesia Hausen, PA   Brief Narrative: Mitchell Stewart y.o.malewith a known history of type 2 diabetes mellitus, dyslipidemia,prostate cancer,dementia andchronicdepression, presented [facility] EDwith acute onset of accidental mechanical fall after getting up to go to the bathroom and falling down a flight of 15 stairs landing on his back on concrete with subsequent neck pain as well as bilateral elbow pain. Patient has significant dementia and unable toprovide ahistory orreview of systems.  Patient currently medically stable for discharge.  Pending placement.  He has been nonadherent with his cervical collar.  Neurosurgery aware.  Mental status has been waxing and waning.  Suspect hospital-acquired delirium.  Patient attempting to pull at lines and get up out of bed.  Sitter in place.   10/26: Patient has been intermittently agitated with significant sundowning.  Suspect delirium superimposed on underlying dementia.  Patient has now been admitted for 15 days and likely his delirium will worsen the longer he stays in the hospital.  Reached out to family who will come sit with the patient at bedside.  Patient needs to be sitter free for 48 hours prior to the SNF excepting.   Assessment & Plan:   Active Problems:   Traumatic fracture of cervical spine (HCC)  Acute hospital-acquired delirium Mental status has been poor last 48 hours Patient difficult to redirect Getting up out of bed, pulling at lines Sitter in place 10/25: Per bedside sitter this morning patient relatively calm 10/26: Pleasant this morning but intermittently confused and agitated.  Patient needs to be sitter free for 48 hours prior to admission.  Will attempt to have family sit with the patient so we can keep him calm, avoid use of sitter and antipsychotic use Plan: DC one-to-one sitter Continue as needed Haldol Communicated  with bedside RN and case manager.  Case manager spoke with patient's family who will come to sit with the patient to attempt to keep him calm.  Once the patient is sitter free for 48 hours she can discharge to skilled nursing facility.  AcuteC6-C7 traumatic spine fracture secondary to mechanical fallpost repair by neurosurgery on 06/03/20. -Continueimmobilizing his C-spine with neck collar until seen by neurosurgery outpatient. -C-spine MRI -- unstable C6-7 fracture, was taken to the OR with NSG on 24/26 -Uncomplicated postop course -Patient has been nonadherent to his c-collar -Pending SNF discharge, delayed by delirium, see above Plan: Pain control p.o. meds as needed SNF placement pending Follow-up with neurosurgery 3 weeks post discharge  Type II diabetesmellituswith hyperglycemia --A1C 7.6 on 06/03/20 -Lantus dose 7 units twice daily insulin sliding scale moderate Carb modified diet  Dyslipidemia. -continue statin therapy.  Dementiawithout behavioral disturbance. Acute onset of delirium superimposed on dementia.  Most notable over the past 72 hours.  Will attempt to reach out to family to see if they can assist in keeping patient calm.  Attempted move patient closer to nursing station.  Delirium precautions.  Frequent reorienting measures.  Chronic depression Stable Continue home Zoloft  BPH. -continue Flomax. Monitor urine output  History of prostate cancer Not on home cancer medications   DVT prophylaxis. -Lovenox, subcu daily.  ResolvedRUQ pain unclear etiology UA negative for pyuria.    DVT prophylaxis: Lovenox Code Status: Full Family Communication: None today.  Case management spoke with patient's son who will come sit with the patient at bedside Disposition Plan: Status is: Inpatient  Remains inpatient appropriate because:Altered mental status, Unsafe d/c plan and Inpatient  level of care appropriate due to severity of  illness   Dispo:  Patient From: Home  Planned Disposition: [place]  Expected discharge date: 06/20/20  Medically stable for discharge: No  Postsurgically patient is medically stable for discharge however his hospital course has been complicated by the development of acute hospital-acquired delirium.  This is unsurprising considering the extended hospital stay and the delays in receiving insurance authorization.  Unfortunately now the patient needs to be sitter free for 48 hours prior to disposition to SNF.  Will attempt to have family sit with the patient to keep him calm.  Ideally would like to get patient out of the hospital 06/20/2020 at the latest.   Consultants:   Neurosurgery  Procedures:   C6/C7 operative fixation  Antimicrobials:   None   Subjective: Seen and examined.  Pleasantly confused this morning.  No complaints  Objective: Vitals:   06/16/20 2329 06/17/20 0755 06/17/20 2140 06/18/20 0800  BP: (!) 151/92 (!) 142/93 (!) 165/97 136/88  Pulse: 88 95 96 92  Resp: 18 16 15 16   Temp: 97.7 F (36.5 C) 98.6 F (37 C) 98.3 F (36.8 C) 98.2 F (36.8 C)  TempSrc: Oral  Oral Oral  SpO2: 99% 100% 100% 100%  Weight:      Height:        Intake/Output Summary (Last 24 hours) at 06/18/2020 1242 Last data filed at 06/18/2020 1044 Gross per 24 hour  Intake 600 ml  Output 1000 ml  Net -400 ml   Filed Weights   06/03/20 0121  Weight: 56.7 kg    Examination:  General exam: No acute distress.  Appears frail Respiratory system: Clear to auscultation. Respiratory effort normal. Cardiovascular system: S1 & S2 heard, RRR. No JVD, murmurs, rubs, gallops or clicks. No pedal edema. Gastrointestinal system: Abdomen is nondistended, soft and nontender. No organomegaly or masses felt. Normal bowel sounds heard. Central nervous system: Alert and oriented. No focal neurological deficits. Extremities: Symmetric 5 x 5 power. Skin: No rashes, lesions or  ulcers Psychiatry: Judgement and insight appear normal. Mood & affect flattened.     Data Reviewed: I have personally reviewed following labs and imaging studies  CBC: Recent Labs  Lab 06/12/20 1230 06/16/20 0725  WBC 9.1 6.9  NEUTROABS 6.8 4.2  HGB 13.7 14.2  HCT 41.0 42.1  MCV 85.4 85.2  PLT 277 580   Basic Metabolic Panel: Recent Labs  Lab 06/12/20 1230 06/16/20 0725 06/17/20 0345  NA 132* 135  --   K 4.0 4.6  --   CL 94* 99  --   CO2 27 27  --   GLUCOSE 181* 138*  --   BUN 19 20  --   CREATININE 0.92 1.02 1.01  CALCIUM 8.9 9.2  --    GFR: Estimated Creatinine Clearance: 48.3 mL/min (by C-G formula based on SCr of 1.01 mg/dL). Liver Function Tests: No results for input(s): AST, ALT, ALKPHOS, BILITOT, PROT, ALBUMIN in the last 168 hours. No results for input(s): LIPASE, AMYLASE in the last 168 hours. No results for input(s): AMMONIA in the last 168 hours. Coagulation Profile: No results for input(s): INR, PROTIME in the last 168 hours. Cardiac Enzymes: No results for input(s): CKTOTAL, CKMB, CKMBINDEX, TROPONINI in the last 168 hours. BNP (last 3 results) No results for input(s): PROBNP in the last 8760 hours. HbA1C: No results for input(s): HGBA1C in the last 72 hours. CBG: Recent Labs  Lab 06/17/20 0758 06/17/20 1156 06/17/20 2120 06/18/20 0801  06/18/20 1128  GLUCAP 113* 293* 247* 126* 187*   Lipid Profile: No results for input(s): CHOL, HDL, LDLCALC, TRIG, CHOLHDL, LDLDIRECT in the last 72 hours. Thyroid Function Tests: No results for input(s): TSH, T4TOTAL, FREET4, T3FREE, THYROIDAB in the last 72 hours. Anemia Panel: No results for input(s): VITAMINB12, FOLATE, FERRITIN, TIBC, IRON, RETICCTPCT in the last 72 hours. Sepsis Labs: No results for input(s): PROCALCITON, LATICACIDVEN in the last 168 hours.  No results found for this or any previous visit (from the past 240 hour(s)).       Radiology Studies: No results  found.      Scheduled Meds: . amLODipine  5 mg Oral Daily  . Chlorhexidine Gluconate Cloth  6 each Topical Daily  . enoxaparin (LOVENOX) injection  40 mg Subcutaneous Q24H  . haloperidol lactate  2.5 mg Intravenous Once  . insulin aspart  0-15 Units Subcutaneous TID WC  . insulin aspart  0-5 Units Subcutaneous QHS  . insulin glargine  7 Units Subcutaneous BID  . pravastatin  20 mg Oral q1800  . QUEtiapine  25 mg Oral QHS  . sertraline  150 mg Oral Daily  . tamsulosin  0.4 mg Oral Daily   Continuous Infusions: . sodium chloride 1,000 mL (06/04/20 1502)  . methocarbamol (ROBAXIN) IV Stopped (06/04/20 0700)     LOS: 15 days    Time spent: 25 minutes    Sidney Ace, MD [facility] Pager [phone]  If 7PM-7AM, please contact night-coverage 06/18/2020, 12:42 PM

## 2020-06-18 NOTE — Progress Notes (Signed)
Physical Therapy Treatment Patient Details Name: Mitchell Stewart MRN: [id number] DOB: 1940/11/26 Today's Date: 06/18/2020    History of Present Illness Pt is a 79 y/o M with PMH: dementia, DM, HOH, HLD and prostate cancer who presented to [facility] ED s/p fall down flight of stairs. Pt found to have cervical spine fx and is now s/p C6-7 ACDF (06/03/2020).    PT Comments    Pt was supine in bed wearing cervical collar upon arriving. He is oriented to self only but is alert and cooperative throughout. Was able to exit R side of bed via log roll technique. Sat EOB with close supervision. Stood to [employer] and ambulated 44ft. Requires constant assist to prevent fall however pt is progressing well. Will benefit from SNF at DC to address deficits and improve safe functional abilities.  At conclusion of session, pt was sitting in recliner with call bell in reach, chair alarm in place and sitter present.  Follow Up Recommendations  SNF     Equipment Recommendations  Other (comment) (defer to next level of care)    Recommendations for Other Services       Precautions / Restrictions Precautions Precautions: Fall;Cervical Precaution Booklet Issued: No Precaution Comments: Brace on at all times when OOB(can be applied seated EOB per order) Required Braces or Orthoses: Cervical Brace Cervical Brace: Hard collar Restrictions Weight Bearing Restrictions: No Other Position/Activity Restrictions: [place] J collar, HOB 30 degrees, okay per MD to take brace off temporaily to adjust brace    Mobility  Bed Mobility Overal bed mobility: Needs Assistance Bed Mobility: Rolling;Sidelying to Sit Rolling: Min assist Sidelying to sit: Mod assist Supine to sit: Mod assist     General bed mobility comments: Pt performed log roll towards R to short sit with increased time and constant vcs/tactile cues  Transfers Overall transfer level: Needs assistance Equipment used: Rolling walker (2 wheeled) Transfers: Sit  to/from Stand Sit to Stand: Mod assist         General transfer comment: mod assist to STS from EOB to RW.  Ambulation/Gait Ambulation/Gait assistance: Min assist Gait Distance (Feet): 75 Feet Assistive device: Rolling walker (2 wheeled) Gait Pattern/deviations: Narrow base of support;Trunk flexed;Drifts right/left;Scissoring;Staggering left;Staggering right Gait velocity: decreased   General Gait Details: pt was able to ambulate and tolerate ambulating 75 ft with RW with constant min assist with occasional mod assist to prevent fall.       Balance Overall balance assessment: Needs assistance;History of Falls Sitting-balance support: Feet supported;Bilateral upper extremity supported Sitting balance-Leahy Scale: Fair Sitting balance - Comments: close supervision for safety   Standing balance support: Bilateral upper extremity supported;During functional activity Standing balance-Leahy Scale: Fair Standing balance comment: Reliant on BUE support + mod assist overall           Cognition Arousal/Alertness: Awake/alert Behavior During Therapy: WFL for tasks assessed/performed Overall Cognitive Status: History of cognitive impairments - at baseline            General Comments: alert and cooperative. oriented to self only             Pertinent Vitals/Pain Pain Assessment: No/denies pain Pain Score: 0-No pain Faces Pain Scale: No hurt Pain Intervention(s): Limited activity within patient's tolerance;Monitored during session;Repositioned           PT Goals (current goals can now be found in the care plan section) Acute Rehab PT Goals Patient Stated Goal: none stated Progress towards PT goals: Progressing toward goals    Frequency  7X/week      PT Plan Current plan remains appropriate       AM-PAC PT "6 Clicks" Mobility   Outcome Measure  Help needed turning from your back to your side while in a flat bed without using bedrails?: A Little Help  needed moving from lying on your back to sitting on the side of a flat bed without using bedrails?: A Little Help needed moving to and from a bed to a chair (including a wheelchair)?: A Little Help needed standing up from a chair using your arms (e.g., wheelchair or bedside chair)?: A Little Help needed to walk in hospital room?: A Lot Help needed climbing 3-5 steps with a railing? : A Lot 6 Click Score: 16    End of Session Equipment Utilized During Treatment: Gait belt Activity Tolerance: Patient tolerated treatment well Patient left: in chair;with call bell/phone within reach;with nursing/sitter in room;with chair alarm set Nurse Communication: Mobility status PT Visit Diagnosis: Muscle weakness (generalized) (M62.81);Unsteadiness on feet (R26.81);History of falling (Z91.81)     Time: 0370-4888 PT Time Calculation (min) (ACUTE ONLY): 26 min  Charges:  $Gait Training: 8-22 mins $Therapeutic Activity: 8-22 mins                     Julaine Fusi PTA 06/18/20, 10:22 AM

## 2020-06-18 NOTE — Progress Notes (Signed)
Patient safety sitter order expired. NP notified. Waiting for response.

## 2020-06-18 NOTE — TOC Progression Note (Signed)
Transition of Care [facility]) - Progression Note    Patient Details  Name: Mitchell Stewart MRN: [id number] Date of Birth: 1941/04/19  Transition of Care [facility]) CM/SW [place], RN Phone Number: 06/18/2020, 10:30 AM  Clinical Narrative:   RNCM reached out to [facility] with [facility] [employer] [facility] to discuss discharge plan, Ria Comment reports that patient will need to be 48 hours sitter free prior to them being able to accept. Placed call to patient's son Mitchell Stewart to discuss and inform of this.     Expected Discharge Plan: [place] Barriers to Discharge: No Barriers Identified  Expected Discharge Plan and Services Expected Discharge Plan: [place] Choice: [place] arrangements for the past 2 months: Single Family Home Expected Discharge Date: 06/10/20                                     Social Determinants of Health (SDOH) Interventions    Readmission Risk Interventions No flowsheet data found.

## 2020-06-19 LAB — GLUCOSE, CAPILLARY
Glucose-Capillary: 165 mg/dL — ABNORMAL HIGH (ref 70–99)
Glucose-Capillary: 170 mg/dL — ABNORMAL HIGH (ref 70–99)
Glucose-Capillary: 108 mg/dL — ABNORMAL HIGH (ref 70–99)
Glucose-Capillary: 241 mg/dL — ABNORMAL HIGH (ref 70–99)

## 2020-06-19 NOTE — Progress Notes (Signed)
Ch visited with Pt in response to [place] by RN. Chaplain. Mitchell Stewart let Ch know that RN was requesting visit for Pt from Ch to help get through to him after he had been rude. Ch met with RN who explained sudden changes in mood and Pt not allowing anyone to touch him. Ch introduced self to Pt and checked on him. Pt says :I am fine." Ch asked if any family had been to visit him, and Pt said that he did not know. Ch asked if Pt needed anything from Ch , like prayer, and Pt responded by saying "I can talk to God myself, directly." Ch updated RN about Pt's mood continuing to stay the same.

## 2020-06-19 NOTE — Progress Notes (Signed)
Physical Therapy Treatment Patient Details Name: Mitchell Stewart MRN: [id number] DOB: Aug 07, 1941 Today's Date: 06/19/2020    History of Present Illness Pt is a 79 y/o M with PMH: dementia, DM, HOH, HLD and prostate cancer who presented to [facility] ED s/p fall down flight of stairs. Pt found to have cervical spine fx and is now s/p C6-7 ACDF (06/03/2020).    PT Comments    Pt was alert laying supine in bed upon arriving. He is oriented to self only but was able to follow commands throughout. Pt request  To have BM. Was able to roll R with min assist + cueing. Required mod assist to achieve short sit EOB. Stood with min assist to RW from elevated bed height. Required mod assist from lower BSC surface. After successful BM, pt able to ambulate into hallway with RW + mod assist. Poor gait posture and very narrow BOS. Able to correct to minimal time prior to requiring more cues to correct. Pt continues to progress towards PT goals. Progress has been slow due to cognition however it seems to be improving overall. Pt pleasantly confused however able to follow commands consistently. Recommend DC to SNF to address deficits and improve safe functional mobility. Pt was in bed with bed alarm set, cervical collar donned, and RN tech in room. Acute PT will continue to follow per POC.    Follow Up Recommendations  SNF     Equipment Recommendations  Other (comment) (defer to next level of care)    Recommendations for Other Services       Precautions / Restrictions Precautions Precautions: Fall;Cervical Precaution Booklet Issued: No Precaution Comments: Brace on at all times when OOB(can be applied seated EOB per order) Required Braces or Orthoses: Cervical Brace Cervical Brace: Hard collar ([place]) Restrictions Weight Bearing Restrictions: No    Mobility  Bed Mobility Overal bed mobility: Needs Assistance Bed Mobility: Rolling;Sidelying to Sit Rolling: Min assist Sidelying to sit: Mod assist Supine  to sit: Max assist (pt struggles to initiate return to supine) Sit to supine: Max assist      Transfers Overall transfer level: Needs assistance Equipment used: Rolling walker (2 wheeled) Transfers: Sit to/from Stand Sit to Stand: Min assist;Mod assist         General transfer comment: min assist to stand from elevated surface height but mod assist from lower BSC  surface  Ambulation/Gait Ambulation/Gait assistance: Min assist;Mod assist Gait Distance (Feet): 100 Feet Assistive device: Rolling walker (2 wheeled) Gait Pattern/deviations: Scissoring;Trunk flexed;Narrow base of support;Decreased step length - right;Decreased step length - left;Step-through pattern;Shuffle Gait velocity: decreased   General Gait Details: Pt was able to ambulate increaseed distance today however continues to be extremely high fall risk. poor gait posture throughout and max vcs for improved BOS       Balance Overall balance assessment: Needs assistance;History of Falls Sitting-balance support: Feet supported;Bilateral upper extremity supported Sitting balance-Leahy Scale: Fair Sitting balance - Comments: close supervision for safety   Standing balance support: Bilateral upper extremity supported;During functional activity Standing balance-Leahy Scale: Poor Standing balance comment: Reliant on BUE support + mod assist overall       Cognition Arousal/Alertness: Awake/alert Behavior During Therapy: WFL for tasks assessed/performed Overall Cognitive Status: History of cognitive impairments - at baseline    General Comments: alert and cooperative. oriented to self only             Pertinent Vitals/Pain Pain Assessment: No/denies pain Pain Score: 0-No pain Faces Pain Scale: No hurt  Pain Intervention(s): Limited activity within patient's tolerance;Monitored during session;Repositioned           PT Goals (current goals can now be found in the care plan section) Acute Rehab PT  Goals Patient Stated Goal: none stated Progress towards PT goals: Progressing toward goals    Frequency    7X/week      PT Plan Current plan remains appropriate       AM-PAC PT "6 Clicks" Mobility   Outcome Measure  Help needed turning from your back to your side while in a flat bed without using bedrails?: A Little Help needed moving from lying on your back to sitting on the side of a flat bed without using bedrails?: A Little Help needed moving to and from a bed to a chair (including a wheelchair)?: A Little Help needed standing up from a chair using your arms (e.g., wheelchair or bedside chair)?: A Little Help needed to walk in hospital room?: A Lot Help needed climbing 3-5 steps with a railing? : A Lot 6 Click Score: 16    End of Session Equipment Utilized During Treatment: Gait belt;Cervical collar Activity Tolerance: Patient tolerated treatment well Patient left: in bed;with call bell/phone within reach;with bed alarm set;with nursing/sitter in room Nurse Communication: Mobility status PT Visit Diagnosis: Muscle weakness (generalized) (M62.81);Unsteadiness on feet (R26.81);History of falling (Z91.81)     Time: 2595-6387 PT Time Calculation (min) (ACUTE ONLY): 24 min  Charges:  $Gait Training: 8-22 mins $Therapeutic Activity: 8-22 mins                     Julaine Fusi PTA 06/19/20, 10:12 AM

## 2020-06-19 NOTE — Plan of Care (Signed)
  Problem: Clinical Measurements: Goal: Ability to maintain clinical measurements within normal limits will improve Outcome: Progressing   

## 2020-06-19 NOTE — Progress Notes (Signed)
Occupational Therapy Treatment Patient Details Name: Mitchell Stewart MRN: [id number] DOB: July 17, 1941 Today's Date: 06/19/2020    History of present illness Pt is a 79 y/o M with PMH: dementia, DM, HOH, HLD and prostate cancer who presented to [facility] ED s/p fall down flight of stairs. Pt found to have cervical spine fx and is now s/p C6-7 ACDF (06/03/2020).   OT comments  Pt alert, pleasant, expresses interest in therapy. Oriented to self only, displaying some level of anxiety today, concerned that he had lost a phone, and made repeated comments along the lines of "I am a moron" and "how can I be so stupid, to lose something like that?" Therapist provided reorientation, reassurance. Mitchell Stewart was able to come to sitting R side EOB, with verbal/tactile cues, but little physical assistance. Able to engage in therex EOB, with close supervision for safety. Pt displays some impulsiveness. Sit<stand with MinA, ambulation, and therex in standing with no LOB -- pt functional mobility much improved from previous session two days prior. Able to self feed today, no cues required. Pt left in recliner, alarm bell activated. Recommend continued OT while hospitalized, with DC to SNF.    Follow Up Recommendations  SNF;Supervision/Assistance - 24 hour    Equipment Recommendations       Recommendations for Other Services      Precautions / Restrictions Precautions Precautions: Fall;Cervical Precaution Booklet Issued: No Precaution Comments: Brace on at all times when OOB(can be applied seated EOB per order) Required Braces or Orthoses: Cervical Brace Cervical Brace: Hard collar Restrictions Weight Bearing Restrictions: No Other Position/Activity Restrictions: [place] J collar, HOB 30 degrees, okay per MD to take brace off temporaily to adjust brace       Mobility Bed Mobility Overal bed mobility: Needs Assistance Bed Mobility: Rolling;Sidelying to Sit Rolling: Min guard (VCs required) Sidelying to sit:  Min assist Supine to sit: Min assist Sit to supine: Max assist   General bed mobility comments: Required repeated VCs and some tactile cues to move from supine<sit, but less physical assistance than previously required  Transfers Overall transfer level: Needs assistance Equipment used: Rolling walker (2 wheeled) Transfers: Sit to/from Stand Sit to Stand: Min assist Stand pivot transfers: Min assist       General transfer comment: VCs for hand placement on RW, close contact required for safety. No LOB.    Balance Overall balance assessment: Needs assistance;History of Falls Sitting-balance support: Feet supported;Bilateral upper extremity supported;No upper extremity supported Sitting balance-Leahy Scale: Fair Sitting balance - Comments: close supervision for safety. Able to sit up straight when given VC   Standing balance support: Bilateral upper extremity supported;During functional activity Standing balance-Leahy Scale: Fair Standing balance comment: Reliant on BUE support + mod assist overall                           ADL either performed or assessed with clinical judgement   ADL Overall ADL's : Needs assistance/impaired Eating/Feeding: Minimal assistance Eating/Feeding Details (indicate cue type and reason): tactile, verbal cues for initiating                 Lower Body Dressing: Min guard Lower Body Dressing Details (indicate cue type and reason): Pulling today on lower half of his hospital gown, concerned about being exposed               General ADL Comments: Mod A for bed mobility and ADL transfers  Vision Baseline Vision/History: Cataracts Patient Visual Report: No change from baseline     Perception     Praxis      Cognition Arousal/Alertness: Awake/alert Behavior During Therapy: WFL for tasks assessed/performed Overall Cognitive Status: History of cognitive impairments - at baseline                                  General Comments: Alert and pleasant, oriented to self only. Perserverating today about "losing his phone" which is causing him some anxiety        Exercises Other Exercises Other Exercises: Marching in place, in sitting and standing. Close supervision required for safety. Provided cognitive reorientation and reassurance re: pt's expressions of anxiety   Shoulder Instructions       General Comments      Pertinent Vitals/ Pain       Pain Assessment: No/denies pain Pain Score: 0-No pain Faces Pain Scale: No hurt Pain Intervention(s): Limited activity within patient's tolerance;Monitored during session;Repositioned  Home Living Family/patient expects to be discharged to:: Skilled nursing facility Living Arrangements: Children Available Help at Discharge: Family;Available PRN/intermittently Type of Home: House Home Access: Stairs to enter [employer] of Steps: 2 Entrance Stairs-Rails: None Home Layout: Two level;Bed/bath upstairs Alternate Level Stairs-Number of Steps: 15 Alternate Level Stairs-Rails: Right Bathroom Shower/Tub: Teacher, early years/pre: Standard     Home Equipment: Environmental consultant - 4 wheels;Shower seat          Prior Functioning/Environment Level of Independence: Needs assistance  Gait / Transfers Assistance Needed: "shuffling" walking pattern per son-Krystofer Jr. Son bought Rollator, but pt not really using. Requires CGA for mobility up stairs. Son barricaded stairs, but pt somehow removed heavy barricade leading to this fall. ADL's / Homemaking Assistance Needed: Some intermittent assist for bathing/dressing d/t waxing/waning cognition, but can usually dress himself. Son indicates pt should be helped with bathing for safety, but often declines assistance. Son assists for all [facility] IADLs such as cooking/cleaning.   Comments: >5 falls in 2 weeks.   Frequency  Min 1X/week        Progress Toward Goals  OT Goals(current goals can now be  found in the care plan section)     Acute Rehab OT Goals Patient Stated Goal: none stated Time For Goal Achievement: 07/01/20  Plan      Co-evaluation                 AM-PAC OT "6 Clicks" Daily Activity     Outcome Measure   Help from another person eating meals?: A Little Help from another person taking care of personal grooming?: A Lot Help from another person toileting, which includes using toliet, bedpan, or urinal?: A Lot Help from another person bathing (including washing, rinsing, drying)?: A Lot Help from another person to put on and taking off regular upper body clothing?: A Lot Help from another person to put on and taking off regular lower body clothing?: A Lot 6 Click Score: 13    End of Session Equipment Utilized During Treatment: Cervical collar;Gait belt;Rolling walker  OT Visit Diagnosis: Unsteadiness on feet (R26.81);Other abnormalities of gait and mobility (R26.89);Repeated falls (R29.6);Muscle weakness (generalized) (M62.81);History of falling (Z91.81)   Activity Tolerance Patient tolerated treatment well   Patient Left in chair;with call bell/phone within reach;with chair alarm set   Nurse Communication          Time: 2706-2376 OT Time Calculation (  min): 37 min  Charges: OT General Charges $OT Visit: 1 Visit OT Treatments $Self Care/Home Management : 23-37 mins  Mitchell Lobo, PhD, [place], OTR/L ascom [phone] 06/19/20, 11:51 AM

## 2020-06-19 NOTE — Progress Notes (Signed)
PROGRESS NOTE    Mitchell Stewart  CWC:[id number] DOB: 1941/05/06 DOA: 06/03/2020 PCP: Lesia Hausen, PA   Brief Narrative: Mitchell Stewart y.o.malewith a known history of type 2 diabetes mellitus, dyslipidemia,prostate cancer,dementia andchronicdepression, [facility] after getting up to go to the bathroom and falling down a flight of 15 stairs landing on his back on concrete with subsequent neck pain as well as bilateral elbow pain. Patient has significant dementia and unable toprovide ahistory orreview of systems.  Patient currently medically stable for discharge.  Pending placement.  He has been nonadherent with his cervical collar.  Neurosurgery aware.  Mental status has been waxing and waning.  Suspect hospital-acquired delirium.  Patient attempting to pull at lines and get up out of bed.  Sitter in place.   10/26: Patient has been intermittently agitated with significant sundowning.  Suspect delirium superimposed on underlying dementia.  Patient has now been admitted for 15 days and likely his delirium will worsen the longer he stays in the hospital.  Reached out to family who will come sit with the patient at bedside.  Patient needs to be sitter free for 48 hours prior to the SNF excepting.   Assessment & Plan:   Active Problems:   Traumatic fracture of cervical spine (HCC)  Acute hospital-acquired delirium Mental status has been poor last 48 hours Patient difficult to redirect Getting up out of bed, pulling at lines Sitter in place 10/25: Per bedside sitter this morning patient relatively calm 10/26: Pleasant this morning but intermittently confused and agitated.  Patient needs to be sitter free for 48 hours prior to admission.  Will attempt to have family sit with the patient so we can keep him calm, avoid use of sitter and antipsychotic use Plan: --avoid sitter --Seroquel nightly --Haldol PRN for  agitation  AcuteC6-C7 traumatic spine fracture secondary to mechanical fallpost repair by neurosurgery on 06/03/20. -Continueimmobilizing his C-spine with neck collar until seen by neurosurgery outpatient. -C-spine MRI -- unstable C6-7 fracture, was taken to the OR with NSG on 76/16 -Uncomplicated postop course -Patient has been nonadherent to his c-collar -Pending SNF discharge, delayed by delirium, see above Plan: Pain control p.o. meds as needed SNF placement pending Follow-up with neurosurgery 3 weeks post discharge  Type II diabetesmellituswith hyperglycemia --A1C 7.6 on 06/03/20 -Lantus dose 7 units twice daily insulin sliding scale moderate Carb modified diet  Dyslipidemia. -continue statin therapy.  Dementiawithout behavioral disturbance. Acute onset of delirium superimposed on dementia.  Most notable over the past 72 hours.  Will attempt to reach out to family to see if they can assist in keeping patient calm.  Attempted move patient closer to nursing station.  Delirium precautions.  Frequent reorienting measures.  Chronic depression Stable Continue home Zoloft  BPH. -continue Flomax. Monitor urine output  History of prostate cancer Not on home cancer medications    ResolvedRUQ pain unclear etiology UA negative for pyuria.    DVT prophylaxis: Lovenox Code Status: Full Family Communication:  Son updated at bedside today Disposition Plan: Status is: Inpatient   Dispo:  Patient From: Home  Planned Disposition: [place] [facility] [place]  Expected discharge date: tomorrow   Medically stable for discharge: yes, but needs to be 48 hours free of sitter before SNF will accept.    Consultants:   Neurosurgery  Procedures:   C6/C7 operative fixation  Antimicrobials:   None   Subjective: Pt did well without sitter overnight.  Seen eating his meals with no  problem.  Having BM's.     Objective: Vitals:   06/18/20 0800 06/18/20 2336  06/19/20 0729 06/19/20 1543  BP: 136/88 (!) 149/92 (!) 141/79 126/82  Pulse: 92 95 (!) 101 (!) 105  Resp: 16 16 17 17   Temp: 98.2 F (36.8 C) 98 F (36.7 C) 97.7 F (36.5 C) 97.7 F (36.5 C)  TempSrc: Oral Oral Axillary Oral  SpO2: 100% 99% 100% 100%  Weight:      Height:        Intake/Output Summary (Last 24 hours) at 06/19/2020 1836 Last data filed at 06/19/2020 0949 Gross per 24 hour  Intake 240 ml  Output 0 ml  Net 240 ml   Filed Weights   06/03/20 0121  Weight: 56.7 kg    Examination:  Constitutional: NAD, alert, confused HEENT: conjunctivae and lids normal, EOMI, neck brace present CV: No cyanosis.   RESP: normal respiratory effort, on RA Extremities: No effusions, edema in BLE SKIN: warm, dry and intact Neuro: II - XII grossly intact.   Psych: Normal mood and affect.     Data Reviewed: I have personally reviewed following labs and imaging studies  CBC: Recent Labs  Lab 06/16/20 0725  WBC 6.9  NEUTROABS 4.2  HGB 14.2  HCT 42.1  MCV 85.2  PLT 093   Basic Metabolic Panel: Recent Labs  Lab 06/16/20 0725 06/17/20 0345  NA 135  --   K 4.6  --   CL 99  --   CO2 27  --   GLUCOSE 138*  --   BUN 20  --   CREATININE 1.02 1.01  CALCIUM 9.2  --    GFR: Estimated Creatinine Clearance: 48.3 mL/min (by C-G formula based on SCr of 1.01 mg/dL). Liver Function Tests: No results for input(s): AST, ALT, ALKPHOS, BILITOT, PROT, ALBUMIN in the last 168 hours. No results for input(s): LIPASE, AMYLASE in the last 168 hours. No results for input(s): AMMONIA in the last 168 hours. Coagulation Profile: No results for input(s): INR, PROTIME in the last 168 hours. Cardiac Enzymes: No results for input(s): CKTOTAL, CKMB, CKMBINDEX, TROPONINI in the last 168 hours. BNP (last 3 results) No results for input(s): PROBNP in the last 8760 hours. HbA1C: No results for input(s): HGBA1C in the last 72 hours. CBG: Recent Labs  Lab 06/18/20 1656 06/18/20 2028  06/19/20 0728 06/19/20 1127 06/19/20 1641  GLUCAP 213* 123* 165* 170* 108*   Lipid Profile: No results for input(s): CHOL, HDL, LDLCALC, TRIG, CHOLHDL, LDLDIRECT in the last 72 hours. Thyroid Function Tests: No results for input(s): TSH, T4TOTAL, FREET4, T3FREE, THYROIDAB in the last 72 hours. Anemia Panel: No results for input(s): VITAMINB12, FOLATE, FERRITIN, TIBC, IRON, RETICCTPCT in the last 72 hours. Sepsis Labs: No results for input(s): PROCALCITON, LATICACIDVEN in the last 168 hours.  No results found for this or any previous visit (from the past 240 hour(s)).       Radiology Studies: No results found.      Scheduled Meds: . amLODipine  5 mg Oral Daily  . Chlorhexidine Gluconate Cloth  6 each Topical Daily  . enoxaparin (LOVENOX) injection  40 mg Subcutaneous Q24H  . haloperidol lactate  2.5 mg Intravenous Once  . insulin aspart  0-15 Units Subcutaneous TID WC  . insulin aspart  0-5 Units Subcutaneous QHS  . insulin glargine  7 Units Subcutaneous BID  . pravastatin  20 mg Oral q1800  . QUEtiapine  25 mg Oral QHS  . sertraline  150 mg  Oral Daily  . tamsulosin  0.4 mg Oral Daily   Continuous Infusions: . sodium chloride 1,000 mL (06/04/20 1502)  . methocarbamol (ROBAXIN) IV Stopped (06/04/20 0700)     LOS: 16 days    Enzo Bi, MD [facility] Pager [phone]  If 7PM-7AM, please contact night-coverage 06/19/2020, 6:36 PM

## 2020-06-20 LAB — GLUCOSE, CAPILLARY
Glucose-Capillary: 100 mg/dL — ABNORMAL HIGH (ref 70–99)
Glucose-Capillary: 235 mg/dL — ABNORMAL HIGH (ref 70–99)

## 2020-06-20 LAB — CBC
HCT: 44.1 % (ref 39.0–52.0)
Hemoglobin: 14.4 g/dL (ref 13.0–17.0)
MCH: 28.2 pg (ref 26.0–34.0)
MCHC: 32.7 g/dL (ref 30.0–36.0)
MCV: 86.3 fL (ref 80.0–100.0)
Platelets: 417 10*3/uL — ABNORMAL HIGH (ref 150–400)
RBC: 5.11 MIL/uL (ref 4.22–5.81)
RDW: 12.7 % (ref 11.5–15.5)
WBC: 7.2 10*3/uL (ref 4.0–10.5)
nRBC: 0 % (ref 0.0–0.2)

## 2020-06-20 LAB — BASIC METABOLIC PANEL
Anion gap: 10 (ref 5–15)
BUN: 15 mg/dL (ref 8–23)
CO2: 26 mmol/L (ref 22–32)
Calcium: 8.9 mg/dL (ref 8.9–10.3)
Chloride: 101 mmol/L (ref 98–111)
Creatinine, Ser: 0.82 mg/dL (ref 0.61–1.24)
GFR, Estimated: >60 mL/min (ref >60)
Glucose, Bld: 81 mg/dL (ref 70–99)
Potassium: 4.2 mmol/L (ref 3.5–5.1)
Sodium: 137 mmol/L (ref 135–145)

## 2020-06-20 LAB — RESPIRATORY PANEL BY RT PCR (FLU A&B, COVID)
Influenza A by PCR: NEGATIVE
Influenza B by PCR: NEGATIVE
SARS Coronavirus 2 by RT PCR: NEGATIVE

## 2020-06-20 LAB — MAGNESIUM: Magnesium: 2 mg/dL (ref 1.7–2.4)

## 2020-06-20 MED ORDER — AMLODIPINE BESYLATE 5 MG PO TABS: 5.0000 mg | ORAL_TABLET | Freq: Every day | ORAL | Status: AC

## 2020-06-20 MED ORDER — BASAGLAR KWIKPEN 100 UNIT/ML ~~LOC~~ SOPN: 7.0000 [IU] | PEN_INJECTOR | Freq: Two times a day (BID) | SUBCUTANEOUS | Status: AC

## 2020-06-20 MED ORDER — TRAMADOL HCL 50 MG PO TABS: 50.0000 mg | ORAL_TABLET | Freq: Two times a day (BID) | ORAL | 0 refills | Status: AC | PRN

## 2020-06-20 NOTE — Plan of Care (Signed)
  Problem: Education: Goal: Knowledge of General Education information will improve Description Including pain rating scale, medication(s)/side effects and non-pharmacologic comfort measures Outcome: Progressing   

## 2020-06-20 NOTE — Progress Notes (Signed)
Discharge Note:  Pt discharged to [facility] today. Attempted twice to call report to [facility], RN with no answer. Report given to [facility] transporters. Transported by [facility]. AVS in packet with patient. IV removed.  Ronnette Hila, RN

## 2020-06-20 NOTE — Care Management Important Message (Signed)
Important Message  Patient Details  Name: Mitchell Stewart MRN: [id number] Date of Birth: Jan 26, 1941   Medicare Important Message Given:  Yes     Juliann Pulse A Shaleen Talamantez 06/20/2020, 11:02 AM

## 2020-06-20 NOTE — Discharge Summary (Addendum)
Physician Discharge Summary   Mitchell Stewart  male DOB: 12-31-1940  GEX:[id number]  PCP: Lesia Hausen, PA  Admit date: 06/03/2020 Discharge date: 06/20/2020  Admitted From: home Disposition:  SNF CODE STATUS: Full code  Discharge Instructions    No wound care   Complete by: As directed        Hospital Course:  For full details, please see H&P, progress notes, consult notes and ancillary notes.  Briefly,  DavidDunlapis [phone] y.o.malewith a known history of type 2 diabetes mellitus, dyslipidemia,prostate cancer,dementia andchronicdepression, [facility] after getting up to go to the bathroom and falling down a flight of 15 stairs landing on his back on concrete with subsequent neck pain as well as bilateral elbow pain.  [id number] traumatic spine fracture secondary to mechanical fallpost repair by neurosurgery on 06/03/20. C-spine MRI on presentation showed unstable C6-7 fracture, was taken to the OR with NSG on 10/11 for ANTERIOR CERVICAL DECOMPRESSION/DISCECTOMY FUSION 1 LEVEL C6/7.  Continuedimmobilizing his C-spine with neck collar until seen by neurosurgery outpatient 1 week after discharge.    Acute hospital-acquired delirium Baseline Dementiawithout behavioral disturbance. Pt received nightly seroquel and was calm and sitter-free for 48 hours prior to discharge.    Type II diabetesmellituswith hyperglycemia A1C 7.6 on 06/03/20.  Pt received Lantus dose 7 units twice daily and SSI while inpatient.  Pt was discharged on Lantus 7u BID.  Dyslipidemia. continued statin therapy.  Chronic depression Stable.  Continued home Zoloft  BPH. continued Flomax.  History of prostate cancer Not on home cancer medications   ResolvedRUQ pain unclear etiology UA negative for pyuria.   Discharge Diagnoses:  Active Problems:   Traumatic fracture of cervical spine [facility])    Discharge Instructions:  Allergies as  of 06/20/2020   No Known Allergies     Medication List    TAKE these medications   amLODipine 5 MG tablet Commonly known as: NORVASC Take 1 tablet (5 mg total) by mouth daily.   Basaglar KwikPen 100 UNIT/ML Inject 7 Units into the skin 2 (two) times daily. What changed:   how much to take  when to take this   loratadine 10 MG tablet Commonly known as: CLARITIN Take 10 mg by mouth daily.   polyethylene glycol 17 g packet Commonly known as: MiraLax Take 17 g by mouth daily as needed.   pravastatin 20 MG tablet Commonly known as: PRAVACHOL Take 1 tablet (20 mg total) by mouth daily at 6 PM.   QUEtiapine 25 MG tablet Commonly known as: SEROQUEL Take 25 mg by mouth daily at 4 PM.   sertraline 100 MG tablet Commonly known as: ZOLOFT Take 1.5 tablets by mouth daily.   tamsulosin 0.4 MG Caps capsule Commonly known as: FLOMAX Take 0.4 mg by mouth daily.   traMADol 50 MG tablet Commonly known as: ULTRAM Take 1 tablet (50 mg total) by mouth every 12 (twelve) hours as needed for up to 5 days for severe pain.            Durable Medical Equipment  (From admission, onward)         Start     Ordered   06/06/20 0604  For home use only DME Walker rolling  Once       Question Answer Comment  Walker: With 5 Inch Wheels   Patient needs a walker to treat with the following condition Ambulatory dysfunction      06/06/20 0604  Follow-up Information    [place], [place], [place]. Schedule an appointment as soon as possible for a visit in 1 week.   Specialty: Internal Medicine Why: November 2nd- No exact time. Contact information: [address] [place] [address] [phone]        Deetta Perla, MD. Schedule an appointment as soon as possible for a visit in 1 week.   Specialty: Neurosurgery Why: November 16th @10 :15 Contact information: [place] [address] [phone]               No Known Allergies   The  results of significant diagnostics from this hospitalization (including imaging, microbiology, ancillary and laboratory) are listed below for reference.   Consultations:   Procedures/Studies: DG Cervical Spine 2 or 3 views  Result Date: 06/04/2020 CLINICAL DATA:  Status post cervical fusion. EXAM: CERVICAL SPINE - 2-3 VIEW COMPARISON:  June 03, 2020. FINDINGS: Status post surgical anterior fusion of C6-7. Severe degenerative disc disease of C5-6 is noted. IMPRESSION: Status post surgical anterior fusion of C6-7. Electronically Signed   By: Marijo Conception M.D.   On: 06/04/2020 14:29   DG Cervical Spine 2 or 3 views  Result Date: 06/03/2020 CLINICAL DATA:  Cervical fusion EXAM: CERVICAL SPINE - 2-3 VIEW; DG C-ARM 1-60 MIN COMPARISON:  MRI from earlier in the same day. FLUOROSCOPY TIME:  Radiation Exposure Index (as provided by the fluoroscopic device): Not available If the device does not provide the exposure index: Fluoroscopy Time: 6 seconds Number of Acquired Images:  2 FINDINGS: Initial image demonstrates a surgical instrument anterior to the cervical spine at the C6-7 level. Mild anterolisthesis of C6 on C7 is seen. Subsequent interbody fusion and anterior fixation is noted at C6-7. IMPRESSION: Interbody fusion at C6-7 with anterior fixation Electronically Signed   By: Inez Catalina M.D.   On: 06/03/2020 20:21   DG Shoulder Right  Result Date: 06/03/2020 CLINICAL DATA:  Pain status post fall EXAM: RIGHT SHOULDER - 2+ VIEW COMPARISON:  None. FINDINGS: There is no evidence of fracture or dislocation. There is no evidence of arthropathy or other focal bone abnormality. Soft tissues are unremarkable. IMPRESSION: Negative. Electronically Signed   By: Constance Holster M.D.   On: 06/03/2020 02:54   DG Elbow Complete Left  Result Date: 06/03/2020 CLINICAL DATA:  Pain EXAM: LEFT ELBOW - COMPLETE 3+ VIEW COMPARISON:  None. FINDINGS: There is soft tissue swelling about the elbow. Degenerative  changes are noted. There is no joint effusion. IMPRESSION: Soft tissue swelling without evidence of an acute displaced fracture. Electronically Signed   By: Constance Holster M.D.   On: 06/03/2020 02:57   DG Elbow Complete Right  Result Date: 06/03/2020 CLINICAL DATA:  Pain EXAM: RIGHT ELBOW - COMPLETE 3+ VIEW COMPARISON:  None. FINDINGS: There is no evidence of fracture, dislocation, or joint effusion. There is no evidence of arthropathy or other focal bone abnormality. Soft tissues are unremarkable. IMPRESSION: Negative. Electronically Signed   By: Constance Holster M.D.   On: 06/03/2020 02:58   DG Abdomen 1 View  Result Date: 06/03/2020 CLINICAL DATA:  Foreign body evaluation for MRI. EXAM: ABDOMEN - 1 VIEW COMPARISON:  None. FINDINGS: No radiopaque foreign body is identified. Gas is present in nondilated loops of small and large bowel without evidence of obstruction. No acute osseous abnormality is seen. IMPRESSION: No radiopaque foreign body identified. Electronically Signed   By: Logan Bores M.D.   On: 06/03/2020 04:33   CT  HEAD WO CONTRAST  Result Date: 06/03/2020 CLINICAL DATA:  79 year old male with altered mental status. Status post fall down stairs with acute lower cervical spine fracture with dorsal hematoma in the spinal canal contributing to moderate spinal stenosis at C6-C7. Multilevel degenerative spinal stenosis. Postoperative day zero ACDF. EXAM: CT HEAD WITHOUT CONTRAST TECHNIQUE: Contiguous axial images were obtained from the base of the skull through the vertex without intravenous contrast. COMPARISON:  Head CT 0150 hours today. FINDINGS: Brain: No midline shift, mass effect, or evidence of intracranial mass lesion. No ventriculomegaly. No acute intracranial hemorrhage identified. Widely scattered and confluent bilateral cerebral white matter hypodensity appears stable from earlier today. No areas of definite cerebral edema. Vascular: Calcified atherosclerosis at the skull  base. Intracranial vascular density appears to be stable from earlier today. No changes of acute cortically based infarct are identified. Skull: No skull fracture identified. Sinuses/Orbits: Stable left maxillary mucous retention cyst. Other visualized paranasal sinuses and mastoids are stable and well pneumatized. Other: Stable orbit and scalp soft tissues. IMPRESSION: Stable non contrast CT appearance of the brain from 0150 hours today. Advanced bilateral white matter disease with no acute intracranial abnormality identified. Electronically Signed   By: Genevie Ann M.D.   On: 06/03/2020 21:56   CT Head Wo Contrast  Result Date: 06/03/2020 CLINICAL DATA:  Fall downstairs, head injury, headache EXAM: CT HEAD WITHOUT CONTRAST CT CERVICAL SPINE WITHOUT CONTRAST TECHNIQUE: Multidetector CT imaging of the head and cervical spine was performed following the standard protocol without intravenous contrast. Multiplanar CT image reconstructions of the cervical spine were also generated. COMPARISON:  None. FINDINGS: CT HEAD FINDINGS Brain: Normal anatomic configuration of the brain. Mild parenchymal volume loss is commensurate with the patient's age. There are moderate to severe periventricular and subcortical white matter changes present likely reflecting the sequela of small vessel ischemia. 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 No acute intracranial hemorrhage or infarct. No abnormal mass effect or midline shift. No abnormal intra or extra-axial mass lesion or fluid collection. Ventricular size is normal. Cerebellum is unremarkable. Vascular: No asymmetric hyperdense vasculature at the skull base. Skull: Intact Sinuses/Orbits: The paranasal sinuses are clear. The orbits are unremarkable. Other: Mastoid air cells and middle ear cavities are clear. CT CERVICAL SPINE FINDINGS Alignment: There is 2 mm anterolisthesis of C6 upon C7, likely posttraumatic in nature. Minimal anterolisthesis of C4 upon C5 is  likely physiologic. Skull base and vertebrae: The craniocervical junction is unremarkable. Atlantal dental interval is normal. There are acute fractures involving the left lamina and spinous process of C6. There is a fracture involving the left pedicle of C6 as well as the the superior articular facet of C7 extending into the left C6-7 facet with mild joint space widening noted. Finally, a fracture extends through the pedicle of C6 on the right. Soft tissues and spinal canal: A small epidural hematoma measuring 3 mm x 10 mm is seen superficial to the C6 laminar fracture demonstrating minimal effacement of the canal a smaller epidural hematoma is seen superiorly posterior to the thecal sac at C5-6. No paraspinal fluid collection is identified. There is subcutaneous infiltration within the posterior soft tissues superficial to the spinous processes likely representing edema. Disc levels: Review of the sagittal reformats demonstrates preservation of vertebral body height. There is intervertebral disc space narrowing and endplate remodeling at Y1-O1 in keeping with changes of moderate degenerative disc disease. 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 Review of the axial images confirms the above-mentioned fractures.  There is multilevel uncovertebral and facet arthrosis resulting in multilevel mild neural foraminal narrowing. 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 Upper chest: Unremarkable Other: None significant IMPRESSION: No acute intracranial injury.  No calvarial fracture. Acute fracture of the cervical spine predominantly involving the posterior elements of C6 with fracture bilateral pedicles and minimal anterolisthesis of C6 upon C7. The fracture pattern is suggestive of a hyperextension injury. Fractures also are identified involving the spinous process of C6, the left lamina of C6, and the left C7 superior articular facet extending into the left C6-7 facet which demonstrates  mild relative widening. 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 Superimposed small epidural hematoma at the level of the fracture and at C5-6 slightly superiorly. These results will be called to the ordering clinician or representative by the Radiologist Assistant, and communication documented in the PACS or [employer]. Electronically Signed   By: Fidela Salisbury MD   On: 06/03/2020 02:26   CT CERVICAL SPINE WO CONTRAST  Result Date: 06/03/2020 CLINICAL DATA:  79 year old male with altered mental status. Status post fall down stairs with acute lower cervical spine fracture with dorsal hematoma in the spinal canal contributing to moderate spinal stenosis at C6-C7. Multilevel degenerative spinal stenosis. Postoperative day zero ACDF. EXAM: CT CERVICAL SPINE WITHOUT CONTRAST TECHNIQUE: Multidetector CT imaging of the cervical spine was performed without intravenous contrast. Multiplanar CT image reconstructions were also generated. COMPARISON:  Cervical spine CT 0150 hours today. Preoperative cervical spine MRI 0503 hours today. FINDINGS: Alignment: Mildly improved cervical lordosis from earlier today. Subtle anterolisthesis of C4 on C5 is stable and appears to be degenerative in nature. Mild posttraumatic anterolisthesis of C6 on C7 appears stable. See additional details below. Skull base and vertebrae: Bilateral C6 pedicle fractures remain nondisplaced. There is less distraction of the C6 lamina fractures now. A nondisplaced fracture through the left C7 superior articulating facet and lamina is stable (sagittal image 41 now). Postoperative details are below. No new osseous abnormality identified. Stable subchondral cyst at the left odontoid. Visualized skull base is intact. No atlanto-occipital dissociation. Soft tissues and spinal canal: Ventral and posterior increased intraspinal density (sagittal image 30 with soft tissue windows) appear stable from the CT earlier this morning and more  resembles ligamentous hypertrophy than intraspinal hemorrhage on the MRI. Small volume of prevertebral and small to moderate volume left lateral neck soft tissue gas is related to interval ACDF. No postoperative fluid collection is evident. Disc levels: Stable CT appearance of the level C2-C3 through C4-C5. Including moderate to severe multifactorial spinal stenosis at C3-C4 (series 3, image 38). C5-C6: Stable disc space loss, circumferential disc osteophyte complex. Mild spinal stenosis, severe left and moderate right C6 foraminal stenosis at this level appears stable from the preoperative MRI. C6-C7: Interval ACDF. Hardware appears intact with no adverse features. Small volume of posterior dural or epidural hemorrhage here appears not significantly changed (series 3, image 56), about 2 mm in thickness. No CT evidence of increased spinal stenosis. And C7 foraminal patency especially on the left appears improved. C7-T1:  Stable, relatively negative. Upper chest: Visible upper thoracic levels remain grossly intact. Mild apical lung scarring on the left is stable. IMPRESSION: 1. Interval ACDF with no adverse hardware features. Bilateral C6 posterior element and left C7 superior articulating facet fractures now are largely nondisplaced. A small volume of dorsal intraspinal hemorrhage eccentric to the left appears stable. No increased spinal stenosis here evident by CT, and C7 foraminal patency appears improved especially  on the left. 2. Degenerative lumbar spinal stenosis elsewhere appears stable, and is moderate to severe at C3-C4 as previously noted. 3. No new osseous abnormality identified. Small volume postoperative gas in the neck. Electronically Signed   By: Genevie Ann M.D.   On: 06/03/2020 22:11   CT Cervical Spine Wo Contrast  Result Date: 06/03/2020 CLINICAL DATA:  Fall downstairs, head injury, headache EXAM: CT HEAD WITHOUT CONTRAST CT CERVICAL SPINE WITHOUT CONTRAST TECHNIQUE: Multidetector CT imaging of  the head and cervical spine was performed following the standard protocol without intravenous contrast. Multiplanar CT image reconstructions of the cervical spine were also generated. COMPARISON:  None. FINDINGS: CT HEAD FINDINGS Brain: Normal anatomic configuration of the brain. Mild parenchymal volume loss is commensurate with the patient's age. There are moderate to severe periventricular and subcortical white matter changes present likely reflecting the sequela of small vessel ischemia. 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 No acute intracranial hemorrhage or infarct. No abnormal mass effect or midline shift. No abnormal intra or extra-axial mass lesion or fluid collection. Ventricular size is normal. Cerebellum is unremarkable. Vascular: No asymmetric hyperdense vasculature at the skull base. Skull: Intact Sinuses/Orbits: The paranasal sinuses are clear. The orbits are unremarkable. Other: Mastoid air cells and middle ear cavities are clear. CT CERVICAL SPINE FINDINGS Alignment: There is 2 mm anterolisthesis of C6 upon C7, likely posttraumatic in nature. Minimal anterolisthesis of C4 upon C5 is likely physiologic. Skull base and vertebrae: The craniocervical junction is unremarkable. Atlantal dental interval is normal. There are acute fractures involving the left lamina and spinous process of C6. There is a fracture involving the left pedicle of C6 as well as the the superior articular facet of C7 extending into the left C6-7 facet with mild joint space widening noted. Finally, a fracture extends through the pedicle of C6 on the right. Soft tissues and spinal canal: A small epidural hematoma measuring 3 mm x 10 mm is seen superficial to the C6 laminar fracture demonstrating minimal effacement of the canal a smaller epidural hematoma is seen superiorly posterior to the thecal sac at C5-6. No paraspinal fluid collection is identified. There is subcutaneous infiltration within the posterior  soft tissues superficial to the spinous processes likely representing edema. Disc levels: Review of the sagittal reformats demonstrates preservation of vertebral body height. There is intervertebral disc space narrowing and endplate remodeling at I4-P3 in keeping with changes of moderate degenerative disc disease. 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 Review of the axial images confirms the above-mentioned fractures. There is multilevel uncovertebral and facet arthrosis resulting in multilevel mild neural foraminal narrowing. 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 Upper chest: Unremarkable Other: None significant IMPRESSION: No acute intracranial injury.  No calvarial fracture. Acute fracture of the cervical spine predominantly involving the posterior elements of C6 with fracture bilateral pedicles and minimal anterolisthesis of C6 upon C7. The fracture pattern is suggestive of a hyperextension injury. Fractures also are identified involving the spinous process of C6, the left lamina of C6, and the left C7 superior articular facet extending into the left C6-7 facet which demonstrates mild relative widening. 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 Superimposed small epidural hematoma at the level of the fracture and at C5-6 slightly superiorly. These results will be called to the ordering clinician or representative by the Radiologist Assistant, and communication documented in the PACS or [employer]. Electronically Signed   By: Fidela Salisbury MD   On: 06/03/2020  02:26   MR Cervical Spine Wo Contrast  Result Date: 06/03/2020 CLINICAL DATA:  Fall with cervical spine fracture. EXAM: MRI CERVICAL SPINE WITHOUT CONTRAST TECHNIQUE: Multiplanar, multisequence MR imaging of the cervical spine was performed. No intravenous contrast was administered. COMPARISON:  Cervical spine CT 06/03/2020 FINDINGS: Image quality is degraded by motion artifact and increased image  noise due to the presence of a cervical spine collar limiting coil positioning. Alignment: Unchanged trace anterolisthesis of C6 on C7. Vertebrae: Mild posterior element edema at C6-7 related to underlying fractures, better demonstrated on CT. Chronic degenerative endplate changes at J5-0 and C6-7. Cord: No convincing spinal cord signal abnormality is identified within study limitations. A small dorsal epidural hematoma at C6-7 measures up to 3 mm in thickness. Posterior Fossa, vertebral arteries, paraspinal tissues: Minimal prevertebral edema. Mild right and moderate left-sided posterior paraspinal soft tissue edema in the mid and lower cervical spine including in the inter spinous soft tissues at C5-6 and C6-7. Disc levels: The cervical spinal canal is diffusely narrow on a congenital basis. Detailed assessment of disc and facet degeneration is limited by motion, however there is severe multifactorial spinal stenosis at C3-4 greater than C4-5 with moderate cord flattening. Multifactorial spinal stenosis at C5-6 is moderate, and there is also moderate spinal stenosis at C6-7 in part secondary to the epidural hematoma. IMPRESSION: 1. Motion degraded examination. 2. Known posterior element fractures at C6-7 with associated mild marrow edema and moderate paraspinal soft tissue/posterior ligamentous complex edema. 3. Small dorsal epidural hematoma at C6-7 contributing to moderate spinal stenosis. 4. No convincing spinal cord signal abnormality. 5. 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 Electronically Signed   By: Logan Bores M.D.   On: 06/03/2020 06:12   DG Chest Portable 1 View  Result Date: 06/03/2020 CLINICAL DATA:  Foreign body evaluation for MRI. EXAM: PORTABLE CHEST 1 VIEW COMPARISON:  None. FINDINGS: The cardiomediastinal silhouette is within normal limits. The patient's chin projects over the right lung apex. There is slight prominence of the interstitial markings without overt  pulmonary edema. No confluent airspace opacity, sizeable pleural effusion, or pneumothorax is identified. No acute osseous abnormality is seen. IMPRESSION: No radiopaque foreign body identified. Electronically Signed   By: Logan Bores M.D.   On: 06/03/2020 04:35   DG Shoulder Left  Result Date: 06/03/2020 CLINICAL DATA:  Pain EXAM: LEFT SHOULDER - 2+ VIEW COMPARISON:  None. FINDINGS: There is no evidence of fracture or dislocation. There is no evidence of arthropathy or other focal bone abnormality. Soft tissues are unremarkable. IMPRESSION: Negative. Electronically Signed   By: Constance Holster M.D.   On: 06/03/2020 02:55   DG C-Arm 1-60 Min  Result Date: 06/03/2020 CLINICAL DATA:  Cervical fusion EXAM: CERVICAL SPINE - 2-3 VIEW; DG C-ARM 1-60 MIN COMPARISON:  MRI from earlier in the same day. FLUOROSCOPY TIME:  Radiation Exposure Index (as provided by the fluoroscopic device): Not available If the device does not provide the exposure index: Fluoroscopy Time: 6 seconds Number of Acquired Images:  2 FINDINGS: Initial image demonstrates a surgical instrument anterior to the cervical spine at the C6-7 level. Mild anterolisthesis of C6 on C7 is seen. Subsequent interbody fusion and anterior fixation is noted at C6-7. IMPRESSION: Interbody fusion at C6-7 with anterior fixation Electronically Signed   By: Inez Catalina M.D.   On: 06/03/2020 20:21      Labs: BNP (last 3 results) No results for input(s): BNP in the last 8760 hours. Basic Metabolic Panel: Recent  Labs  Lab 06/16/20 0725 06/17/20 0345 06/20/20 0445  NA 135  --  137  K 4.6  --  4.2  CL 99  --  101  CO2 27  --  26  GLUCOSE 138*  --  81  BUN 20  --  15  CREATININE 1.02 1.01 0.82  CALCIUM 9.2  --  8.9  MG  --   --  2.0   Liver Function Tests: No results for input(s): AST, ALT, ALKPHOS, BILITOT, PROT, ALBUMIN in the last 168 hours. No results for input(s): LIPASE, AMYLASE in the last 168 hours. No results for input(s):  AMMONIA in the last 168 hours. CBC: Recent Labs  Lab 06/16/20 0725 06/20/20 0445  WBC 6.9 7.2  NEUTROABS 4.2  --   HGB 14.2 14.4  HCT 42.1 44.1  MCV 85.2 86.3  PLT 348 417*   Cardiac Enzymes: No results for input(s): CKTOTAL, CKMB, CKMBINDEX, TROPONINI in the last 168 hours. BNP: Invalid input(s): POCBNP CBG: Recent Labs  Lab 06/19/20 1127 06/19/20 1641 06/19/20 2057 06/20/20 0741 06/20/20 1139  GLUCAP 170* 108* 241* 100* 235*   D-Dimer No results for input(s): DDIMER in the last 72 hours. Hgb A1c No results for input(s): HGBA1C in the last 72 hours. Lipid Profile No results for input(s): CHOL, HDL, LDLCALC, TRIG, CHOLHDL, LDLDIRECT in the last 72 hours. Thyroid function studies No results for input(s): TSH, T4TOTAL, T3FREE, THYROIDAB in the last 72 hours.  Invalid input(s): FREET3 Anemia work up No results for input(s): VITAMINB12, FOLATE, FERRITIN, TIBC, IRON, RETICCTPCT in the last 72 hours. Urinalysis    Component Value Date/Time   COLORURINE YELLOW (A) 06/06/2020 0739   APPEARANCEUR CLOUDY (A) 06/06/2020 0739   LABSPEC 1.024 06/06/2020 0739   PHURINE 5.0 06/06/2020 0739   GLUCOSEU >=500 (A) 06/06/2020 0739   HGBUR NEGATIVE 06/06/2020 0739   BILIRUBINUR NEGATIVE 06/06/2020 0739   KETONESUR 20 (A) 06/06/2020 0739   PROTEINUR 30 (A) 06/06/2020 0739   NITRITE NEGATIVE 06/06/2020 0739   LEUKOCYTESUR NEGATIVE 06/06/2020 0739   Sepsis Labs Invalid input(s): PROCALCITONIN,  WBC,  LACTICIDVEN Microbiology Recent Results (from the past 240 hour(s))  Respiratory Panel by RT PCR (Flu A&B, Covid) - Nasopharyngeal Swab     Status: None   Collection Time: 06/20/20  8:42 AM   Specimen: Nasopharyngeal Swab  Result Value Ref Range Status   SARS Coronavirus 2 by RT PCR NEGATIVE NEGATIVE Final    Comment: (NOTE) SARS-CoV-2 target nucleic acids are NOT DETECTED.  The SARS-CoV-2 RNA is generally detectable in upper respiratoy specimens during the acute phase of  infection. The lowest concentration of SARS-CoV-2 viral copies this assay can detect is 131 copies/mL. A negative result does not preclude SARS-Cov-2 infection and should not be used as the sole basis for treatment or other patient management decisions. A negative result may occur with  improper specimen collection/handling, submission of specimen other than nasopharyngeal swab, presence of viral mutation(s) within the areas targeted by this assay, and inadequate number of viral copies (<131 copies/mL). A negative result must be combined with clinical observations, patient history, and epidemiological information. The expected result is Negative.  Fact Sheet for Patients:  PinkCheek.be  Fact Sheet for Healthcare Providers:  GravelBags.it  This test is no t yet approved or cleared by the [place] FDA and  has been authorized for detection and/or diagnosis of SARS-CoV-2 by FDA under an Emergency Use Authorization (EUA). This EUA will remain  in effect (meaning this test can  be used) for the duration of the COVID-19 declaration under Section 564(b)(1) of the Act, 21 U.S.C. section 360bbb-3(b)(1), unless the authorization is terminated or revoked sooner.     Influenza A by PCR NEGATIVE NEGATIVE Final   Influenza B by PCR NEGATIVE NEGATIVE Final    Comment: (NOTE) The Xpert Xpress SARS-CoV-2/FLU/RSV assay is intended as an aid in  the diagnosis of influenza from Nasopharyngeal swab specimens and  should not be used as a sole basis for treatment. Nasal washings and  aspirates are unacceptable for Xpert Xpress SARS-CoV-2/FLU/RSV  testing.  Fact Sheet for Patients: PinkCheek.be  Fact Sheet for Healthcare Providers: GravelBags.it  This test is not yet approved or cleared by the [place] FDA and  has been authorized for detection and/or diagnosis of SARS-CoV-2  by  FDA under an Emergency Use Authorization (EUA). This EUA will remain  in effect (meaning this test can be used) for the duration of the  Covid-19 declaration under Section 564(b)(1) of the Act, 21  U.S.C. section 360bbb-3(b)(1), unless the authorization is  terminated or revoked. Performed at [facility], [address] [place], [place] [address]      Total time spend on discharging this patient, including the last patient exam, discussing the hospital stay, instructions for ongoing care as it relates to all pertinent caregivers, as well as preparing the medical discharge records, prescriptions, and/or referrals as applicable, is 30 minutes.    Enzo Bi, MD  [facility] 06/20/2020, 12:14 PM  If 7PM-7AM, please contact night-coverage

## 2020-08-02 ENCOUNTER — Encounter (HOSPITAL_COMMUNITY): Payer: Self-pay

## 2020-08-02 ENCOUNTER — Observation Stay (HOSPITAL_COMMUNITY)
Admission: EM | Admit: 2020-08-02 | Discharge: 2020-08-03 | Disposition: A | Discharge status: Home / Self Care | Payer: Medicare (Managed Care) | Attending: Student | Admitting: Student

## 2020-08-02 ENCOUNTER — Emergency Department (HOSPITAL_COMMUNITY): Payer: Medicare (Managed Care)

## 2020-08-02 DIAGNOSIS — Z79899 Other long term (current) drug therapy: Secondary | ICD-10-CM | POA: Diagnosis not present

## 2020-08-02 DIAGNOSIS — Y92129 Unspecified place in nursing home as the place of occurrence of the external cause: Secondary | ICD-10-CM | POA: Diagnosis not present

## 2020-08-02 DIAGNOSIS — S7292XA Unspecified fracture of left femur, initial encounter for closed fracture: Secondary | ICD-10-CM | POA: Diagnosis present

## 2020-08-02 DIAGNOSIS — F0390 Unspecified dementia without behavioral disturbance: Secondary | ICD-10-CM | POA: Insufficient documentation

## 2020-08-02 DIAGNOSIS — W19XXXA Unspecified fall, initial encounter: Secondary | ICD-10-CM

## 2020-08-02 DIAGNOSIS — S79912A Unspecified injury of left hip, initial encounter: Secondary | ICD-10-CM | POA: Diagnosis present

## 2020-08-02 DIAGNOSIS — I10 Essential (primary) hypertension: Secondary | ICD-10-CM | POA: Insufficient documentation

## 2020-08-02 DIAGNOSIS — Z794 Long term (current) use of insulin: Secondary | ICD-10-CM | POA: Insufficient documentation

## 2020-08-02 DIAGNOSIS — F0391 Unspecified dementia with behavioral disturbance: Secondary | ICD-10-CM | POA: Diagnosis present

## 2020-08-02 DIAGNOSIS — C61 Malignant neoplasm of prostate: Secondary | ICD-10-CM | POA: Diagnosis present

## 2020-08-02 DIAGNOSIS — Z20822 Contact with and (suspected) exposure to covid-19: Secondary | ICD-10-CM | POA: Diagnosis not present

## 2020-08-02 DIAGNOSIS — S72002A Fracture of unspecified part of neck of left femur, initial encounter for closed fracture: Secondary | ICD-10-CM | POA: Diagnosis present

## 2020-08-02 DIAGNOSIS — E119 Type 2 diabetes mellitus without complications: Secondary | ICD-10-CM | POA: Diagnosis not present

## 2020-08-02 DIAGNOSIS — F03918 Unspecified dementia, unspecified severity, with other behavioral disturbance: Secondary | ICD-10-CM | POA: Diagnosis present

## 2020-08-02 DIAGNOSIS — Z515 Encounter for palliative care: Secondary | ICD-10-CM

## 2020-08-02 DIAGNOSIS — R296 Repeated falls: Secondary | ICD-10-CM

## 2020-08-02 DIAGNOSIS — S0083XA Contusion of other part of head, initial encounter: Secondary | ICD-10-CM | POA: Diagnosis present

## 2020-08-02 DIAGNOSIS — Z8546 Personal history of malignant neoplasm of prostate: Secondary | ICD-10-CM | POA: Insufficient documentation

## 2020-08-02 DIAGNOSIS — E785 Hyperlipidemia, unspecified: Secondary | ICD-10-CM | POA: Diagnosis present

## 2020-08-02 DIAGNOSIS — S72145A Nondisplaced intertrochanteric fracture of left femur, initial encounter for closed fracture: Principal | ICD-10-CM | POA: Insufficient documentation

## 2020-08-02 LAB — CBC WITH DIFFERENTIAL/PLATELET
Abs Immature Granulocytes: 0.14 10*3/uL — ABNORMAL HIGH (ref 0.00–0.07)
Basophils Absolute: 0 10*3/uL (ref 0.0–0.1)
Basophils Relative: 0 %
Eosinophils Absolute: 0 10*3/uL (ref 0.0–0.5)
Eosinophils Relative: 0 %
HCT: 44.3 % (ref 39.0–52.0)
Hemoglobin: 14 g/dL (ref 13.0–17.0)
Immature Granulocytes: 1 %
Lymphocytes Relative: 3 %
Lymphs Abs: 0.6 10*3/uL — ABNORMAL LOW (ref 0.7–4.0)
MCH: 27.8 pg (ref 26.0–34.0)
MCHC: 31.6 g/dL (ref 30.0–36.0)
MCV: 88.1 fL (ref 80.0–100.0)
Monocytes Absolute: 0.8 10*3/uL (ref 0.1–1.0)
Monocytes Relative: 4 %
Neutro Abs: 17.9 10*3/uL — ABNORMAL HIGH (ref 1.7–7.7)
Neutrophils Relative %: 92 %
Platelets: 249 10*3/uL (ref 150–400)
RBC: 5.03 MIL/uL (ref 4.22–5.81)
RDW: 14.4 % (ref 11.5–15.5)
WBC: 19.5 10*3/uL — ABNORMAL HIGH (ref 4.0–10.5)
nRBC: 0 % (ref 0.0–0.2)

## 2020-08-02 LAB — RESP PANEL BY RT-PCR (FLU A&B, COVID) ARPGX2
Influenza A by PCR: NEGATIVE
Influenza B by PCR: NEGATIVE
SARS Coronavirus 2 by RT PCR: NEGATIVE

## 2020-08-02 LAB — BASIC METABOLIC PANEL
Anion gap: 13 (ref 5–15)
BUN: 18 mg/dL (ref 8–23)
CO2: 24 mmol/L (ref 22–32)
Calcium: 9.4 mg/dL (ref 8.9–10.3)
Chloride: 103 mmol/L (ref 98–111)
Creatinine, Ser: 0.94 mg/dL (ref 0.61–1.24)
GFR, Estimated: >60 mL/min (ref >60)
Glucose, Bld: 63 mg/dL — ABNORMAL LOW (ref 70–99)
Potassium: 4.2 mmol/L (ref 3.5–5.1)
Sodium: 140 mmol/L (ref 135–145)

## 2020-08-02 LAB — TYPE AND SCREEN
ABO/RH(D): O POS
Antibody Screen: NEGATIVE

## 2020-08-02 LAB — CBG MONITORING, ED
Glucose-Capillary: 147 mg/dL — ABNORMAL HIGH (ref 70–99)
Glucose-Capillary: 219 mg/dL — ABNORMAL HIGH (ref 70–99)

## 2020-08-02 LAB — PROTIME-INR
INR: 1 (ref 0.8–1.2)
Prothrombin Time: 13.2 seconds (ref 11.4–15.2)

## 2020-08-02 MED ORDER — METHOCARBAMOL 1000 MG/10ML IJ SOLN
500.0000 mg | Freq: Four times a day (QID) | INTRAVENOUS | Status: DC | PRN
  Filled 2020-08-02 (×2): qty 5

## 2020-08-02 MED ORDER — PRAVASTATIN SODIUM 40 MG PO TABS
20.0000 mg | ORAL_TABLET | Freq: Every day | ORAL | Status: DC
  Administered 2020-08-02: 20 mg via ORAL
  Filled 2020-08-02: qty 2

## 2020-08-02 MED ORDER — DOCUSATE SODIUM 100 MG PO CAPS
100.0000 mg | ORAL_CAPSULE | Freq: Two times a day (BID) | ORAL | Status: DC
  Administered 2020-08-02 – 2020-08-03 (×2): 100 mg via ORAL
  Filled 2020-08-02 (×2): qty 1

## 2020-08-02 MED ORDER — HALOPERIDOL LACTATE 5 MG/ML IJ SOLN: 2.0000 mg | Freq: Four times a day (QID) | INTRAMUSCULAR | Status: DC | PRN

## 2020-08-02 MED ORDER — POLYETHYLENE GLYCOL 3350 17 G PO PACK: 17.0000 g | PACK | Freq: Every day | ORAL | Status: DC | PRN

## 2020-08-02 MED ORDER — METOPROLOL TARTRATE 25 MG PO TABS
25.0000 mg | ORAL_TABLET | Freq: Two times a day (BID) | ORAL | Status: DC
  Administered 2020-08-02 – 2020-08-03 (×2): 25 mg via ORAL
  Filled 2020-08-02 (×2): qty 1

## 2020-08-02 MED ORDER — MONTELUKAST SODIUM 10 MG PO TABS
10.0000 mg | ORAL_TABLET | Freq: Every day | ORAL | Status: DC
  Administered 2020-08-03: 10:00:00 10 mg via ORAL
  Filled 2020-08-02: qty 1

## 2020-08-02 MED ORDER — MORPHINE SULFATE (PF) 2 MG/ML IV SOLN: 0.5000 mg | INTRAVENOUS | Status: DC | PRN

## 2020-08-02 MED ORDER — BASAGLAR KWIKPEN 100 UNIT/ML ~~LOC~~ SOPN: 7.0000 [IU] | PEN_INJECTOR | Freq: Two times a day (BID) | SUBCUTANEOUS | Status: DC

## 2020-08-02 MED ORDER — ENOXAPARIN SODIUM 40 MG/0.4ML ~~LOC~~ SOLN
40.0000 mg | SUBCUTANEOUS | Status: DC
  Administered 2020-08-02: 40 mg via SUBCUTANEOUS
  Filled 2020-08-02: qty 0.4

## 2020-08-02 MED ORDER — LORATADINE 10 MG PO TABS
10.0000 mg | ORAL_TABLET | Freq: Every day | ORAL | Status: DC
  Administered 2020-08-03: 10:00:00 10 mg via ORAL
  Filled 2020-08-02: qty 1

## 2020-08-02 MED ORDER — AMLODIPINE BESYLATE 5 MG PO TABS
5.0000 mg | ORAL_TABLET | Freq: Every day | ORAL | Status: DC
  Administered 2020-08-03: 10:00:00 5 mg via ORAL
  Filled 2020-08-02: qty 1

## 2020-08-02 MED ORDER — TAMSULOSIN HCL 0.4 MG PO CAPS
0.4000 mg | ORAL_CAPSULE | Freq: Every day | ORAL | Status: DC
  Administered 2020-08-02: 0.4 mg via ORAL
  Filled 2020-08-02: qty 1

## 2020-08-02 MED ORDER — OLANZAPINE 10 MG PO TABS
10.0000 mg | ORAL_TABLET | Freq: Every day | ORAL | Status: DC
  Administered 2020-08-02: 10 mg via ORAL
  Filled 2020-08-02 (×3): qty 1

## 2020-08-02 MED ORDER — INSULIN ASPART 100 UNIT/ML ~~LOC~~ SOLN
0.0000 [IU] | Freq: Three times a day (TID) | SUBCUTANEOUS | Status: DC
  Administered 2020-08-03: 13:00:00 2 [IU] via SUBCUTANEOUS
  Administered 2020-08-03 (×2): 8 [IU] via SUBCUTANEOUS

## 2020-08-02 MED ORDER — HYDROCODONE-ACETAMINOPHEN 5-325 MG PO TABS
1.0000 | ORAL_TABLET | Freq: Four times a day (QID) | ORAL | Status: DC | PRN
  Administered 2020-08-02: 1 via ORAL
  Filled 2020-08-02: qty 1

## 2020-08-02 MED ORDER — METHOCARBAMOL 500 MG PO TABS: 500.0000 mg | ORAL_TABLET | Freq: Four times a day (QID) | ORAL | Status: DC | PRN

## 2020-08-02 MED ORDER — MIRTAZAPINE 15 MG PO TABS
7.5000 mg | ORAL_TABLET | Freq: Every day | ORAL | Status: DC
  Administered 2020-08-02: 23:00:00 7.5 mg via ORAL
  Filled 2020-08-02: qty 1

## 2020-08-02 MED ORDER — LIDOCAINE HCL (PF) 1 % IJ SOLN
5.0000 mL | Freq: Once | INTRAMUSCULAR | Status: AC
  Administered 2020-08-02: 5 mL
  Filled 2020-08-02: qty 5

## 2020-08-02 MED ORDER — SERTRALINE HCL 50 MG PO TABS
150.0000 mg | ORAL_TABLET | Freq: Every day | ORAL | Status: DC
  Administered 2020-08-03: 10:00:00 150 mg via ORAL
  Filled 2020-08-02: qty 1

## 2020-08-02 MED ORDER — BISACODYL 5 MG PO TBEC: 5.0000 mg | DELAYED_RELEASE_TABLET | Freq: Every day | ORAL | Status: DC | PRN

## 2020-08-02 NOTE — ED Notes (Signed)
Patient transported to X-ray 

## 2020-08-02 NOTE — ED Provider Notes (Signed)
[facility] EMERGENCY DEPARTMENT Provider Note   CSN: [id number] Arrival date & time: 08/02/20  1303     History Chief Complaint  Patient presents with  . Fall    Mitchell Stewart is a 79 y.o. male with a past medical history significant for dementia, depression, diabetes, hyperlipidemia, and prostate cancer who presents to the ED via EMS from [facility] after an unwitnessed fall that occurred just prior to arrival. Spoke to staff at [facility] who found patient on the floor in his room next to a broken trash can. Staff believes that patient was attempting to get up from his chair, fell, and struck his head on the trash can. Patient just moved to [facility] yesterday so staff not fully familiar with patient's baseline. Staff notes that patient was confused all morning. He has a history of dementia. C-collar placed at nursing home. Patient sustained a laceration of head from fall and head was wrapped prior to arrival.    Level 5 caveat secondary to history of dementia.      Past Medical History:  Diagnosis Date  . Dementia ([place])   . Depression   . Diabetes mellitus without complication ([facility])   . Erectile dysfunction   . Hard of hearing   . History of diverticulitis   . History of kidney stones   . Hyperlipidemia   . Osteoporosis   . Prostate cancer [facility])     Patient Active Problem List   Diagnosis Date Noted  . Closed left hip fracture, initial encounter ([place]) 08/02/2020  . Traumatic fracture of cervical spine ([place]) 06/03/2020  . Dementia without behavioral disturbance ([place])   . Forehead contusion     Past Surgical History:  Procedure Laterality Date  . ANTERIOR CERVICAL DECOMP/DISCECTOMY FUSION N/A 06/03/2020   Procedure: ANTERIOR CERVICAL DECOMPRESSION/DISCECTOMY FUSION 1 LEVEL C6/7;  Surgeon: Deetta Perla, MD;  Location: [facility] ORS;  Service: Neurosurgery;  Laterality: N/A;       No family history on file.  Social History   Tobacco Use  .  Smoking status: Never Smoker  . Smokeless tobacco: Never Used    Home Medications Prior to Admission medications   Medication Sig Start Date End Date Taking? Authorizing Provider  amLODipine (NORVASC) 5 MG tablet Take 1 tablet (5 mg total) by mouth daily. 06/20/20  Yes Enzo Bi, MD  Insulin Glargine [facility]) 100 UNIT/ML Inject 7 Units into the skin 2 (two) times daily. 06/20/20  Yes Enzo Bi, MD  insulin lispro (HUMALOG) 100 UNIT/ML injection Inject 2-10 Units into the skin See admin instructions. Inject 2-10 units subcutaneously three times daily before meals and at bedtime per sliding scale: CBG 0-199 0 units, 200-250 2 units, 251-300 4 units, 301-350 6 units, 351-400 8 units, 401-450 10 units, >450 10 units and call MD/ recheck in 1-2 hours   Yes [provider]  linagliptin (TRADJENTA) 5 MG TABS tablet Take 5 mg by mouth daily.   Yes [provider]  loratadine (CLARITIN) 10 MG tablet Take 10 mg by mouth daily.   Yes [provider]  metoprolol tartrate (LOPRESSOR) 25 MG tablet Take 25 mg by mouth 2 (two) times daily. 07/30/20  Yes [provider]  mirtazapine (REMERON) 7.5 MG tablet Take 7.5 mg by mouth at bedtime.   Yes [provider]  montelukast (SINGULAIR) 10 MG tablet Take 10 mg by mouth daily. 07/27/20  Yes [provider]  OLANZapine (ZYPREXA) 10 MG tablet Take 10 mg by  mouth at bedtime.   Yes [provider]  polyethylene glycol (MIRALAX) 17 g packet Take 17 g by mouth daily as needed. Patient taking differently: Take 17 g by mouth daily as needed (constipation). 06/07/20  Yes Irene Pap N, DO  pravastatin (PRAVACHOL) 20 MG tablet Take 1 tablet (20 mg total) by mouth daily at 6 PM. 06/07/20 09/05/20 Yes Hall, Lorenda Cahill, DO  predniSONE (DELTASONE) 10 MG tablet Take 10 mg by mouth See admin instructions. Take one tablet (10 mg) by mouth for 2 days - 08/02/20 and 08/03/20 - for cough 07/26/20  Yes [provider]  sertraline (ZOLOFT) 100 MG tablet Take 150 mg by mouth daily. 05/30/20  Yes [provider]  tamsulosin (FLOMAX) 0.4 MG CAPS capsule Take 0.4 mg by mouth daily at 6 PM. 05/30/20  Yes [provider]    Allergies    Patient has no known allergies.  Review of Systems   Review of Systems  Unable to perform ROS: Dementia    Physical Exam Updated Vital Signs BP (!) 153/86   Pulse (!) 59   Temp 98 F (36.7 C)   Resp 15   Ht 5\' 6"  (1.676 m)   Wt 56.7 kg   SpO2 (!) 41%   BMI 20.18 kg/m   Physical Exam Vitals and nursing note reviewed.  Constitutional:      General: He is not in acute distress.    Appearance: He is not toxic-appearing.  HENT:     Head: Normocephalic.     Comments: 2 in laceration left forehead. No battle sign, raccoon eyes, hemotympanum, or septal hematomas. Eyes:     Pupils: Pupils are equal, round, and reactive to light.  Neck:     Comments: C-collar in place. Cardiovascular:     Rate and Rhythm: Normal rate and regular rhythm.     Pulses: Normal pulses.     Heart sounds: Normal heart sounds. No murmur heard. No friction rub. No gallop.   Pulmonary:     Effort: Pulmonary effort is normal.     Breath sounds: Normal breath sounds.  Abdominal:     General: Abdomen is flat. Bowel sounds are normal. There is no distension.     Palpations: Abdomen is soft.     Tenderness: There is no abdominal tenderness. There is no guarding or rebound.  Musculoskeletal:     Cervical back: Neck supple.     Comments: Tenderness throughout right shoulder and left hip.   Skin:    Comments: 2 in laceration on left forehead.   Neurological:     General: No focal deficit present.     Mental Status: He is alert.     Comments: AAO x1 (to self). Able to follow short commands.   Psychiatric:        Mood and Affect: Mood normal.        Behavior: Behavior normal.     ED Results / Procedures / Treatments   Labs (all labs ordered are listed,  but only abnormal results are displayed) Labs Reviewed  CBG MONITORING, ED - Abnormal; Notable for the following components:      Result Value   Glucose-Capillary 147 (*)    All other components within normal limits  RESP PANEL BY RT-PCR (FLU A&B, COVID) [id number]  BASIC METABOLIC PANEL  CBC WITH DIFFERENTIAL/PLATELET  PROTIME-INR  TYPE AND SCREEN    EKG EKG Interpretation  Date/Time:  Friday August 02 2020 13:15:45 EST Ventricular Rate:  88  PR Interval:    QRS Duration: 83 QT Interval:  369 QTC Calculation: 447 R Axis:   80 Text Interpretation: Sinus rhythm unremarkable ECG Confirmed by Carmin Muskrat [id number]) on 08/02/2020 3:19:14 PM   Radiology DG Shoulder Right  Result Date: 08/02/2020 CLINICAL DATA:  Fall, pain EXAM: RIGHT SHOULDER - 2+ VIEW COMPARISON:  None. FINDINGS: There is no evidence of fracture or dislocation. Mild right acromioclavicular arthrosis. Soft tissues are unremarkable. IMPRESSION: No fracture or dislocation of the right shoulder. Mild right acromioclavicular arthrosis. Electronically Signed   By: Eddie Candle M.D.   On: 08/02/2020 15:25   CT Head Wo Contrast  Result Date: 08/02/2020 CLINICAL DATA:  Fall striking left forehead, laceration.  Neck pain EXAM: CT HEAD WITHOUT CONTRAST CT CERVICAL SPINE WITHOUT CONTRAST TECHNIQUE: Multidetector CT imaging of the head and cervical spine was performed following the standard protocol without intravenous contrast. Multiplanar CT image reconstructions of the cervical spine were also generated. COMPARISON:  CT cervical spine 06/03/2020, CT head 06/03/2020, chest x-ray 06/03/2020 FINDINGS: CT HEAD FINDINGS Brain: Cerebral ventricle sizes are concordant with the degree of cerebral volume loss. Patchy and confluent areas of decreased attenuation are noted throughout the deep and periventricular white matter of the cerebral hemispheres bilaterally, compatible with chronic microvascular ischemic disease. No evidence of  large-territorial acute infarction. No parenchymal hemorrhage. No mass lesion. No extra-axial collection. No mass effect or midline shift. No hydrocephalus. Basilar cisterns are patent. Vascular: No hyperdense vessel. Skull: No acute fracture or focal lesion. Sinuses/Orbits: Mucosal polyp within the left maxillary sinus. Otherwise the paranasal sinuses and mastoid air cells are clear. The orbits are unremarkable. Other: None. CT CERVICAL SPINE FINDINGS Alignment: Redemonstration of grade 1 anterolisthesis of the surgerized levels of C6 on C7. Similar grade 1 anterolisthesis of C4 on C5. Otherwise unremarkable. Skull base and vertebrae: Redemonstration of C6-C7 anterior fusion and interbody cage. Healed C6 fracture. No acute fracture. Redemonstration of a densely sclerotic and slightly irregular lesion of the left T3 lamina, pedicle, and transverse process. Soft tissues and spinal canal: No prevertebral fluid or swelling. No visible canal hematoma. Disc levels:  Maintained. Upper chest: Linear atelectasis versus scarring within the left apex. No pneumothorax. Other: Interval resolution of subcutaneus soft tissue emphysema. IMPRESSION: 1. No acute intracranial abnormality. 2. No acute displaced fracture or traumatic listhesis of the cervical spine. 3. Indeterminate similar-appearing densely sclerotic lesion of the left T3 lamina, pedicle, and transverse process. Finding could represent metastasis in a patient with prostate cancer. These results were called by telephone at the time of interpretation on 08/02/2020 at 4:05 pm to provider PA [facility] , who verbally acknowledged these results. Electronically Signed   By: Iven Finn M.D.   On: 08/02/2020 16:07   CT Cervical Spine Wo Contrast  Result Date: 08/02/2020 CLINICAL DATA:  Fall striking left forehead, laceration.  Neck pain EXAM: CT HEAD WITHOUT CONTRAST CT CERVICAL SPINE WITHOUT CONTRAST TECHNIQUE: Multidetector CT imaging of the head and  cervical spine was performed following the standard protocol without intravenous contrast. Multiplanar CT image reconstructions of the cervical spine were also generated. COMPARISON:  CT cervical spine 06/03/2020, CT head 06/03/2020, chest x-ray 06/03/2020 FINDINGS: CT HEAD FINDINGS Brain: Cerebral ventricle sizes are concordant with the degree of cerebral volume loss. Patchy and confluent areas of decreased attenuation are noted throughout the deep and periventricular white matter of the cerebral hemispheres bilaterally, compatible with chronic microvascular ischemic disease. No evidence of large-territorial acute infarction. No parenchymal hemorrhage. No mass lesion.  No extra-axial collection. No mass effect or midline shift. No hydrocephalus. Basilar cisterns are patent. Vascular: No hyperdense vessel. Skull: No acute fracture or focal lesion. Sinuses/Orbits: Mucosal polyp within the left maxillary sinus. Otherwise the paranasal sinuses and mastoid air cells are clear. The orbits are unremarkable. Other: None. CT CERVICAL SPINE FINDINGS Alignment: Redemonstration of grade 1 anterolisthesis of the surgerized levels of C6 on C7. Similar grade 1 anterolisthesis of C4 on C5. Otherwise unremarkable. Skull base and vertebrae: Redemonstration of C6-C7 anterior fusion and interbody cage. Healed C6 fracture. No acute fracture. Redemonstration of a densely sclerotic and slightly irregular lesion of the left T3 lamina, pedicle, and transverse process. Soft tissues and spinal canal: No prevertebral fluid or swelling. No visible canal hematoma. Disc levels:  Maintained. Upper chest: Linear atelectasis versus scarring within the left apex. No pneumothorax. Other: Interval resolution of subcutaneus soft tissue emphysema. IMPRESSION: 1. No acute intracranial abnormality. 2. No acute displaced fracture or traumatic listhesis of the cervical spine. 3. Indeterminate similar-appearing densely sclerotic lesion of the left T3 lamina,  pedicle, and transverse process. Finding could represent metastasis in a patient with prostate cancer. These results were called by telephone at the time of interpretation on 08/02/2020 at 4:05 pm to provider PA [facility] , who verbally acknowledged these results. Electronically Signed   By: Iven Finn M.D.   On: 08/02/2020 16:07   DG Hip Unilat W or Wo Pelvis 2-3 Views Left  Result Date: 08/02/2020 CLINICAL DATA:  Status post fall. EXAM: DG HIP (WITH OR WITHOUT PELVIS) 2-3V LEFT COMPARISON:  None. FINDINGS: An acute nondisplaced fracture is seen extending through the inter trochanteric region of the proximal left femur. There is no evidence of dislocation. Mild degenerative changes seen involving the left hip, in the form of joint space narrowing and acetabular sclerosis. IMPRESSION: Acute nondisplaced intertrochanteric fracture of the proximal left femur. Electronically Signed   By: Virgina Norfolk M.D.   On: 08/02/2020 15:27   DG Hip Unilat W or Wo Pelvis 2-3 Views Right  Result Date: 08/02/2020 CLINICAL DATA:  Status post fall. EXAM: DG HIP (WITH OR WITHOUT PELVIS) 2-3V RIGHT COMPARISON:  None. FINDINGS: An acute nondisplaced fracture is seen extending through the inter trochanteric region of the proximal left femur. There is no evidence of dislocation. Mild degenerative changes seen involving both hips, in the form of joint space narrowing and acetabular sclerosis. IMPRESSION: Acute nondisplaced intertrochanteric fracture of the proximal left femur. Electronically Signed   By: Virgina Norfolk M.D.   On: 08/02/2020 15:28   DG Femur Min 2 Views Left  Result Date: 08/02/2020 CLINICAL DATA:  Status post fall. EXAM: LEFT FEMUR 2 VIEWS COMPARISON:  None. FINDINGS: An acute nondisplaced fracture is seen extending through the inter trochanteric region of the proximal left femur. There is no evidence of dislocation. Soft tissues are unremarkable. IMPRESSION: Acute nondisplaced  intertrochanteric fracture of the proximal left femur. Electronically Signed   By: Virgina Norfolk M.D.   On: 08/02/2020 15:25    Procedures Procedures (including critical care time)  Medications Ordered in ED Medications  lidocaine (PF) (XYLOCAINE) 1 % injection 5 mL (has no administration in time range)    ED Course  I have reviewed the triage vital signs and the nursing notes.  Pertinent labs & imaging results that were available during my care of the patient were reviewed by me and considered in my medical decision making (see chart for details).  Clinical Course as of 08/02/20 1737  Fri Aug 02, 2020  1313 Glucose-Capillary(!): 147 [CA]    Clinical Course User Index [CA] Karie Kirks   MDM Rules/Calculators/A&P                         79 year old male presents to the ED via EMS from [facility] after an unwitnessed fall.  Staff at [facility] found patient on the ground next to a broken trash can a 2 inch laceration to left forehead. Patient has a history of dementia. He is not currently on any blood thinners.  Upon arrival, vitals all within normal limits.  Patient in no acute distress and nontoxic-appearing.  Physical exam significant for 2 inch laceration to left side of forehead. C-collar in place. Tenderness throughout left femur. Decreased ROM of LLE.  Will obtain CT head and cervical spine to rule out bony fracture and any intracranial abnormalities.  Bilateral hip, left femur, and right shoulder x-rays ordered to rule out bony fractures.  CT head and cervical spine personally reviewed which demonstrates: IMPRESSION:  1. No acute intracranial abnormality.  2. No acute displaced fracture or traumatic listhesis of the  cervical spine.  3. Indeterminate similar-appearing densely sclerotic lesion of the  left T3 lamina, pedicle, and transverse process. Finding could  represent metastasis in a patient with prostate cancer.   X-rays personally reviewed which  demonstrates: IMPRESSION:  Acute nondisplaced intertrochanteric fracture of the proximal left  femur.   Hip fracture order set placed. COVID test ordered. Discussed results with patient's son, Chozen who notes that patient is no longer getting cancer treatment due to deterioration and he is unsure about possible metastatic cancer.  Discussed case with Dr. Lucia Gaskins with orthopedics who will see patient. Discussed case with Dr. Lorin Mercy with [facility] who agrees to admit patient for further treatment.  Final Clinical Impression(s) / ED Diagnoses Final diagnoses:  Fall, initial encounter    Rx / DC Orders ED Discharge Orders    None       Karie Kirks 08/02/20 1738    Carmin Muskrat, MD 08/06/20 2259

## 2020-08-02 NOTE — H&P (Signed)
History and Physical    JUDDSON COBERN XIP:[id number] DOB: Feb 06, 1941 DOA: 08/02/2020  PCP: Lesia Hausen, PA Consultants:  Lacinda Axon - neurosurgery; Polascik - oncology Patient coming from: [facility]; NOK: Daughter, STOKELY, JEANCHARLES, [phone]; Gilad, Dugger, [phone]   Chief Complaint: [name]  HPI: Mitchell Stewart is a 79 y.o. male with medical history significant of prostate CA; HLD; DM; and dementia presenting with a fall.  He was previously admitted to [facility] from 10/21-28 from home with a fall resulting in a C-7 traumatic fracture that was repaired by neurosurgery and he was discharged to SNF.  "All he's gonna do is fall", frequent falls, would not respond well to the surgery.  Both [place] and [place] are in agreement with progression towards comfort care, Mitchell surgical intervention.  He was just moved to this facility yesterday, not even 24 hours there.  They are in agreement with palliative care.     ED Course:   L femur fracture, Dr. Lucia Gaskins consulted.  H/o dementia, found down at SNF.  Has left forehead lac.  C-spine with ?metastatic disease, Mitchell further treatment due to deterioration.  Oriented to self only, which is his baseline.  Review of Systems: Unable to perform   Ambulatory Status:  Non-ambulatory  COVID Vaccine Status:  Complete  Past Medical History:  Diagnosis Date  . Dementia ([place])   . Depression   . Diabetes mellitus without complication ([place])   . Erectile dysfunction   . Hard of hearing   . History of diverticulitis   . History of kidney stones   . Hyperlipidemia   . Osteoporosis   . Prostate cancer [facility])     Past Surgical History:  Procedure Laterality Date  . ANTERIOR CERVICAL DECOMP/DISCECTOMY FUSION N/A 06/03/2020   Procedure: ANTERIOR CERVICAL DECOMPRESSION/DISCECTOMY FUSION 1 LEVEL C6/7;  Surgeon: Deetta Perla, MD;  Location: [facility] ORS;  Service: Neurosurgery;  Laterality: N/A;    Social History   Socioeconomic History  . Marital status:  Legally Separated    Spouse name: Not on file  . Number of children: Not on file  . Years of education: Not on file  . Highest education level: Not on file  Occupational History  . Not on file  Tobacco Use  . Smoking status: Never Smoker  . Smokeless tobacco: Never Used  Substance and Sexual Activity  . Alcohol use: Not on file  . Drug use: Not on file  . Sexual activity: Not on file  Other Topics Concern  . Not on file  Social History Narrative  . Not on file   Social Determinants of Health   Financial Resource Strain: Not on file  Food Insecurity: Not on file  Transportation Needs: Not on file  Physical Activity: Not on file  Stress: Not on file  Social Connections: Not on file  Intimate Partner Violence: Not on file    Mitchell Known Allergies  Mitchell family history on file.  Prior to Admission medications   Medication Sig Start Date End Date Taking? Authorizing Provider  amLODipine (NORVASC) 5 MG tablet Take 1 tablet (5 mg total) by mouth daily. 06/20/20  Yes Enzo Bi, MD  Insulin Glargine [facility]) 100 UNIT/ML Inject 7 Units into the skin 2 (two) times daily. 06/20/20  Yes Enzo Bi, MD  insulin lispro (HUMALOG) 100 UNIT/ML injection Inject 2-10 Units into the skin See admin instructions. Inject 2-10 units subcutaneously three times daily before meals and at bedtime per sliding scale: CBG 0-199 0 units,  200-250 2 units, 251-300 4 units, 301-350 6 units, 351-400 8 units, 401-450 10 units, >450 10 units and call MD/ recheck in 1-2 hours   Yes [provider]  linagliptin (TRADJENTA) 5 MG TABS tablet Take 5 mg by mouth daily.   Yes [provider]  loratadine (CLARITIN) 10 MG tablet Take 10 mg by mouth daily.   Yes [provider]  metoprolol tartrate (LOPRESSOR) 25 MG tablet Take 25 mg by mouth 2 (two) times daily. 07/30/20  Yes [provider]  mirtazapine (REMERON) 7.5 MG tablet Take 7.5 mg by mouth at bedtime.   Yes [provider]  montelukast (SINGULAIR) 10 MG tablet Take 10 mg by mouth daily. 07/27/20  Yes [provider]  OLANZapine (ZYPREXA) 10 MG tablet Take 10 mg by mouth at bedtime.   Yes [provider]  polyethylene glycol (MIRALAX) 17 g packet Take 17 g by mouth daily as needed. Patient taking differently: Take 17 g by mouth daily as needed (constipation). 06/07/20  Yes Irene Pap N, DO  pravastatin (PRAVACHOL) 20 MG tablet Take 1 tablet (20 mg total) by mouth daily at 6 PM. 06/07/20 09/05/20 Yes Hall, Lorenda Cahill, DO  predniSONE (DELTASONE) 10 MG tablet Take 10 mg by mouth See admin instructions. Take Stewart tablet (10 mg) by mouth for 2 days - 08/02/20 and 08/03/20 - for cough 07/26/20  Yes [provider]  sertraline (ZOLOFT) 100 MG tablet Take 150 mg by mouth daily. 05/30/20  Yes [provider]  tamsulosin (FLOMAX) 0.4 MG CAPS capsule Take 0.4 mg by mouth daily at 6 PM. 05/30/20  Yes [provider]    Physical Exam: Vitals:   08/02/20 1715 08/02/20 1730 08/02/20 1745 08/02/20 1800  BP:  (!) 153/86  (!) 126/99  Pulse: (!) 31 (!) 59  (!) 103  Resp: 17 15  20   Temp:      SpO2: (!) 69% (!) 41% 100% 100%  Weight:      Height:         . General:  Appears calm and comfortable and in NAD; does not appear uncomfortable with hip but is clearly unhappy with monitors and lines . Eyes:  PERRL, EOMI, normal lids, iris . ENT:  Hard of hearing with hearing aids in place, grossly normal lips & tongue, mmm . Neck:  Mitchell LAD, masses or thyromegaly . Cardiovascular:  RRR, Mitchell m/r/g. Mitchell LE edema.  Marland Kitchen Respiratory:   CTA bilaterally with Mitchell wheezes/rales/rhonchi.  Normal respiratory effort. . Abdomen:  soft, NT, ND, NABS . Skin:  Mitchell rash or induration seen on limited exam; left temporal forehead laceration . Musculoskeletal:  L leg is shorter and externally rotated . Psychiatric:   speech fluent and inappropriate, AOx 0-1 (possibly to self) . Neurologic:  Unable to  perform    Radiological Exams on Admission: Independently reviewed - see discussion in A/P where applicable  DG Shoulder Right  Result Date: 08/02/2020 CLINICAL DATA:  Fall, pain EXAM: RIGHT SHOULDER - 2+ VIEW COMPARISON:  None. FINDINGS: There is Mitchell evidence of fracture or dislocation. Mild right acromioclavicular arthrosis. Soft tissues are unremarkable. IMPRESSION: Mitchell fracture or dislocation of the right shoulder. Mild right acromioclavicular arthrosis. Electronically Signed   By: Eddie Candle M.D.   On: 08/02/2020 15:25   CT Head Wo Contrast  Result Date: 08/02/2020 CLINICAL DATA:  Fall striking left forehead, laceration.  Neck pain EXAM: CT HEAD WITHOUT CONTRAST CT CERVICAL SPINE WITHOUT CONTRAST TECHNIQUE: Multidetector CT  imaging of the head and cervical spine was performed following the standard protocol without intravenous contrast. Multiplanar CT image reconstructions of the cervical spine were also generated. COMPARISON:  CT cervical spine 06/03/2020, CT head 06/03/2020, chest x-ray 06/03/2020 FINDINGS: CT HEAD FINDINGS Brain: Cerebral ventricle sizes are concordant with the degree of cerebral volume loss. Patchy and confluent areas of decreased attenuation are noted throughout the deep and periventricular white matter of the cerebral hemispheres bilaterally, compatible with chronic microvascular ischemic disease. Mitchell evidence of large-territorial acute infarction. Mitchell parenchymal hemorrhage. Mitchell mass lesion. Mitchell extra-axial collection. Mitchell mass effect or midline shift. Mitchell hydrocephalus. Basilar cisterns are patent. Vascular: Mitchell hyperdense vessel. Skull: Mitchell acute fracture or focal lesion. Sinuses/Orbits: Mucosal polyp within the left maxillary sinus. Otherwise the paranasal sinuses and mastoid air cells are clear. The orbits are unremarkable. Other: None. CT CERVICAL SPINE FINDINGS Alignment: Redemonstration of grade 1 anterolisthesis of the surgerized levels of C6 on C7. Similar grade 1  anterolisthesis of C4 on C5. Otherwise unremarkable. Skull base and vertebrae: Redemonstration of C6-C7 anterior fusion and interbody cage. Healed C6 fracture. Mitchell acute fracture. Redemonstration of a densely sclerotic and slightly irregular lesion of the left T3 lamina, pedicle, and transverse process. Soft tissues and spinal canal: Mitchell prevertebral fluid or swelling. Mitchell visible canal hematoma. Disc levels:  Maintained. Upper chest: Linear atelectasis versus scarring within the left apex. Mitchell pneumothorax. Other: Interval resolution of subcutaneus soft tissue emphysema. IMPRESSION: 1. Mitchell acute intracranial abnormality. 2. Mitchell acute displaced fracture or traumatic listhesis of the cervical spine. 3. Indeterminate similar-appearing densely sclerotic lesion of the left T3 lamina, pedicle, and transverse process. Finding could represent metastasis in a patient with prostate cancer. These results were called by telephone at the time of interpretation on 08/02/2020 at 4:05 pm to provider PA [facility] , who verbally acknowledged these results. Electronically Signed   By: Iven Finn M.D.   On: 08/02/2020 16:07   CT Cervical Spine Wo Contrast  Result Date: 08/02/2020 CLINICAL DATA:  Fall striking left forehead, laceration.  Neck pain EXAM: CT HEAD WITHOUT CONTRAST CT CERVICAL SPINE WITHOUT CONTRAST TECHNIQUE: Multidetector CT imaging of the head and cervical spine was performed following the standard protocol without intravenous contrast. Multiplanar CT image reconstructions of the cervical spine were also generated. COMPARISON:  CT cervical spine 06/03/2020, CT head 06/03/2020, chest x-ray 06/03/2020 FINDINGS: CT HEAD FINDINGS Brain: Cerebral ventricle sizes are concordant with the degree of cerebral volume loss. Patchy and confluent areas of decreased attenuation are noted throughout the deep and periventricular white matter of the cerebral hemispheres bilaterally, compatible with chronic microvascular  ischemic disease. Mitchell evidence of large-territorial acute infarction. Mitchell parenchymal hemorrhage. Mitchell mass lesion. Mitchell extra-axial collection. Mitchell mass effect or midline shift. Mitchell hydrocephalus. Basilar cisterns are patent. Vascular: Mitchell hyperdense vessel. Skull: Mitchell acute fracture or focal lesion. Sinuses/Orbits: Mucosal polyp within the left maxillary sinus. Otherwise the paranasal sinuses and mastoid air cells are clear. The orbits are unremarkable. Other: None. CT CERVICAL SPINE FINDINGS Alignment: Redemonstration of grade 1 anterolisthesis of the surgerized levels of C6 on C7. Similar grade 1 anterolisthesis of C4 on C5. Otherwise unremarkable. Skull base and vertebrae: Redemonstration of C6-C7 anterior fusion and interbody cage. Healed C6 fracture. Mitchell acute fracture. Redemonstration of a densely sclerotic and slightly irregular lesion of the left T3 lamina, pedicle, and transverse process. Soft tissues and spinal canal: Mitchell prevertebral fluid or swelling. Mitchell visible canal hematoma. Disc levels:  Maintained. Upper chest: Linear atelectasis versus  scarring within the left apex. Mitchell pneumothorax. Other: Interval resolution of subcutaneus soft tissue emphysema. IMPRESSION: 1. Mitchell acute intracranial abnormality. 2. Mitchell acute displaced fracture or traumatic listhesis of the cervical spine. 3. Indeterminate similar-appearing densely sclerotic lesion of the left T3 lamina, pedicle, and transverse process. Finding could represent metastasis in a patient with prostate cancer. These results were called by telephone at the time of interpretation on 08/02/2020 at 4:05 pm to provider PA [facility] , who verbally acknowledged these results. Electronically Signed   By: Iven Finn M.D.   On: 08/02/2020 16:07   DG Hip Unilat W or Wo Pelvis 2-3 Views Left  Result Date: 08/02/2020 CLINICAL DATA:  Status post fall. EXAM: DG HIP (WITH OR WITHOUT PELVIS) 2-3V LEFT COMPARISON:  None. FINDINGS: An acute nondisplaced fracture is  seen extending through the inter trochanteric region of the proximal left femur. There is Mitchell evidence of dislocation. Mild degenerative changes seen involving the left hip, in the form of joint space narrowing and acetabular sclerosis. IMPRESSION: Acute nondisplaced intertrochanteric fracture of the proximal left femur. Electronically Signed   By: Virgina Norfolk M.D.   On: 08/02/2020 15:27   DG Hip Unilat W or Wo Pelvis 2-3 Views Right  Result Date: 08/02/2020 CLINICAL DATA:  Status post fall. EXAM: DG HIP (WITH OR WITHOUT PELVIS) 2-3V RIGHT COMPARISON:  None. FINDINGS: An acute nondisplaced fracture is seen extending through the inter trochanteric region of the proximal left femur. There is Mitchell evidence of dislocation. Mild degenerative changes seen involving both hips, in the form of joint space narrowing and acetabular sclerosis. IMPRESSION: Acute nondisplaced intertrochanteric fracture of the proximal left femur. Electronically Signed   By: Virgina Norfolk M.D.   On: 08/02/2020 15:28   DG Femur Min 2 Views Left  Result Date: 08/02/2020 CLINICAL DATA:  Status post fall. EXAM: LEFT FEMUR 2 VIEWS COMPARISON:  None. FINDINGS: An acute nondisplaced fracture is seen extending through the inter trochanteric region of the proximal left femur. There is Mitchell evidence of dislocation. Soft tissues are unremarkable. IMPRESSION: Acute nondisplaced intertrochanteric fracture of the proximal left femur. Electronically Signed   By: Virgina Norfolk M.D.   On: 08/02/2020 15:25    EKG: Independently reviewed.  NSR with rate 88; Mitchell evidence of acute ischemia   Labs on Admission: I have personally reviewed the available labs and imaging studies at the time of the admission.  Pertinent labs:   Glucose 63 WBC 19.5 INR 1.0   Assessment/Plan Principal Problem:   Closed left hip fracture, initial encounter (HCC) Active Problems:   Dementia with behavioral disturbance (HCC)   Forehead contusion    Recurrent falls   Prostate cancer metastatic to multiple sites [facility])   Diabetes mellitus type 2 in nonobese ([place])   Dyslipidemia    Left hip fracture -Mechanical fall resulting in hip fracture -Orthopedics consulted -Based on discussion with family and due to patient's recurrent falls and advanced dementia, the decision was made to not proceed with operative repair at this time -Lovenox ordered -Pain control with Robxain, Vicodin, and Morphine prn -TOC consult for rehab placement; he just moved to [facility] yesterday and family does intend to send him back there (after discussion regarding this issue) -Will need PT/OT consult  -Hip fracture order set utilized -Touchdown weight-bearing as tolerated (although it will be difficult to explain this to the patient given his advanced dementia)  Recurrent falls -Patient was previously admitted with a fall requiring C-spine fusion on 10/11 -He  has continued to have recurrent falls since -Ongoing likelihood of recurrent falls is very high -Will order a sitter for safety for now  Advanced dementia -Patient with agitation during prior hospitalization which improved with nightly Seroquel -Currently on Remeron, Zyprexa, high-dose Zoloft -Will also order prn Haldol -Safety sitter requested  DM -A1C 7.6 was on 06/03/20.   -Hold Glargine given hypoglycemia on labs -Hold Tradjenta -Will cover with moderate-scale SSI -Given his advanced dementia and goal for primarily comfort, his glycemic goal should be 120-200  HTN -Continue Norvasc, Lopressor  Dyslipidemia -Continue Pravachol  Prostate CA -Last seen by oncology in 05/2019 -Polaris scoring indicated more aggressive disease -he was offered rad onc, med onc, and biopsy -The patient declined all and opted for watchful waiting -Imaging today indicates possible T-spine metastatic disease -Family has declined further treatment for this issue  BPH -Continue Flomax  DNR/Goals of  Care -I have discussed code status with the patient's family and they are in agreement that the patient would not desire resuscitation and would prefer to die a natural death should that situation arise. -He will need a gold out of facility DNR form at the time of discharge -Further, they prefer to make any time he has left as comfortable as possible; they are in agreement with palliative care consultation here in the hospital and at his SNF    Note: This patient has been tested and is pending for the novel coronavirus COVID-19. The patient has been fully vaccinated against COVID-19.    DVT prophylaxis:  Lovenox Code Status:  DNR - confirmed with family Family Communication: None present; I spoke with both his son and daughter by telephone at the time of admission  Disposition Plan:  Home once clinically improved Consults called: Orthopedics; TOC team; PT/OT; Nutrition Admission status: Admit - It is my clinical opinion that admission to INPATIENT is reasonable and necessary because of the expectation that this patient will require hospital care that crosses at least 2 midnights to treat this condition based on the medical complexity of the problems presented.  Given the aforementioned information, the predictability of an adverse outcome is felt to be significant.    Karmen Bongo MD [facility]   How to contact the [facility] Attending or Consulting provider [place] or covering provider during after hours [place], for this patient?  1. Check the care team in [facility] and look for a) attending/consulting TRH provider listed and b) the [facility] team listed 2. Log into [email or web address] and use [place]'s universal password to access. If you do not have the password, please contact the hospital operator. 3. Locate the [facility] provider you are looking for under [facility] and page to a number that you can be directly reached. 4. If you still have difficulty reaching the provider, please page the  [facility] (Director on Call) for the Hospitalists listed on amion for assistance.   08/02/2020, 7:27 PM

## 2020-08-02 NOTE — ED Notes (Signed)
Pt was playing with pulse ox cord, gave false reading of 41% RA, true O2 was 100% RA.

## 2020-08-02 NOTE — ED Triage Notes (Addendum)
Pt arrived via [facility] from [facility] for an unwitnessed fall. Pt is not on blood thinners. Pt arrived in a c-collar that the nursing home placed on him. Per EMS the nursing home staff told them pt's bs 503, because they forgot to give him his insulin. Pt is A&Ox1 to self only. Unsure if that is baseline. Pt arrived with head wrapped in kerlex. Pt c/o right shoulder pain.

## 2020-08-02 NOTE — ED Notes (Addendum)
Pt has 2 in lac on left anterior of head/forehead. Bleeding is controlled.

## 2020-08-02 NOTE — ED Notes (Signed)
Pt given 8 oz apple juice and small container of apple juice. BMP showed glucose at 63

## 2020-08-02 NOTE — Consult Note (Signed)
Brief orthopedic consult note:  Mitchell Stewart is a 79 year old male with advanced dementia and advanced prostate cancer who was brought to the emergency department by EMS from [facility] after unwitnessed fall.  X-rays revealed a left intertrochanteric hip fracture.  Per ED provider patient is relatively nonambulatory due to dementia and has had several falls recently.  Orthopedics was consulted.  I called the patient's son Mitchell Stewart and had a lengthy conversation about his diagnosis and treatment options.  We discussed nonoperative and operative treatment.  We discussed the benefits of operative treatment being pain control, early weightbearing and more predictable fracture healing.  We also discussed the risks of nonoperative treatment due to prolonged bedrest and immobility.  Given the patient's advanced dementia, cancer diagnosis, nonambulatory status with multiple falls recently and other comorbidities the patients son Mitchell Stewart, who is the power of attorney, would like to opt for nonoperative treatment.  Patient will be admitted to the hospitalist team for pain control and medical care.  He is suitable for assistance getting out of bed to sit up in a chair but otherwise would recommend touchdown weightbearing only on the left lower extremity.  I realize that this will be difficult given the patient's dementia status and I stressed this to his son who again indicated desire for nonoperative treatment.  Full consult note to follow.

## 2020-08-03 ENCOUNTER — Other Ambulatory Visit: Payer: Self-pay

## 2020-08-03 DIAGNOSIS — F0391 Unspecified dementia with behavioral disturbance: Secondary | ICD-10-CM

## 2020-08-03 DIAGNOSIS — Z66 Do not resuscitate: Secondary | ICD-10-CM

## 2020-08-03 DIAGNOSIS — S7292XA Unspecified fracture of left femur, initial encounter for closed fracture: Secondary | ICD-10-CM | POA: Diagnosis present

## 2020-08-03 DIAGNOSIS — Z515 Encounter for palliative care: Secondary | ICD-10-CM

## 2020-08-03 DIAGNOSIS — W19XXXA Unspecified fall, initial encounter: Secondary | ICD-10-CM | POA: Diagnosis not present

## 2020-08-03 DIAGNOSIS — S0083XA Contusion of other part of head, initial encounter: Secondary | ICD-10-CM

## 2020-08-03 DIAGNOSIS — R5381 Other malaise: Secondary | ICD-10-CM

## 2020-08-03 DIAGNOSIS — C61 Malignant neoplasm of prostate: Secondary | ICD-10-CM

## 2020-08-03 DIAGNOSIS — Z7189 Other specified counseling: Secondary | ICD-10-CM

## 2020-08-03 DIAGNOSIS — R296 Repeated falls: Secondary | ICD-10-CM

## 2020-08-03 DIAGNOSIS — Z789 Other specified health status: Secondary | ICD-10-CM

## 2020-08-03 DIAGNOSIS — E119 Type 2 diabetes mellitus without complications: Secondary | ICD-10-CM | POA: Diagnosis not present

## 2020-08-03 DIAGNOSIS — S72002A Fracture of unspecified part of neck of left femur, initial encounter for closed fracture: Secondary | ICD-10-CM

## 2020-08-03 DIAGNOSIS — F0390 Unspecified dementia without behavioral disturbance: Secondary | ICD-10-CM

## 2020-08-03 LAB — ABO/RH: ABO/RH(D): O POS

## 2020-08-03 LAB — CBC
HCT: 38.4 % — ABNORMAL LOW (ref 39.0–52.0)
Hemoglobin: 12.5 g/dL — ABNORMAL LOW (ref 13.0–17.0)
MCH: 27.9 pg (ref 26.0–34.0)
MCHC: 32.6 g/dL (ref 30.0–36.0)
MCV: 85.7 fL (ref 80.0–100.0)
Platelets: 221 10*3/uL (ref 150–400)
RBC: 4.48 MIL/uL (ref 4.22–5.81)
RDW: 14.4 % (ref 11.5–15.5)
WBC: 17 10*3/uL — ABNORMAL HIGH (ref 4.0–10.5)
nRBC: 0 % (ref 0.0–0.2)

## 2020-08-03 LAB — BASIC METABOLIC PANEL
Anion gap: 13 (ref 5–15)
BUN: 16 mg/dL (ref 8–23)
CO2: 23 mmol/L (ref 22–32)
Calcium: 9.1 mg/dL (ref 8.9–10.3)
Chloride: 99 mmol/L (ref 98–111)
Creatinine, Ser: 1 mg/dL (ref 0.61–1.24)
GFR, Estimated: >60 mL/min (ref >60)
Glucose, Bld: 252 mg/dL — ABNORMAL HIGH (ref 70–99)
Potassium: 4.8 mmol/L (ref 3.5–5.1)
Sodium: 135 mmol/L (ref 135–145)

## 2020-08-03 LAB — GLUCOSE, CAPILLARY
Glucose-Capillary: 257 mg/dL — ABNORMAL HIGH (ref 70–99)
Glucose-Capillary: 138 mg/dL — ABNORMAL HIGH (ref 70–99)
Glucose-Capillary: 264 mg/dL — ABNORMAL HIGH (ref 70–99)

## 2020-08-03 MED ORDER — MORPHINE SULFATE (CONCENTRATE) 10 MG /0.5 ML PO SOLN: 10.0000 mg | ORAL | 0 refills | Status: DC | PRN

## 2020-08-03 MED ORDER — METHOCARBAMOL 500 MG PO TABS: 500.0000 mg | ORAL_TABLET | Freq: Four times a day (QID) | ORAL | 0 refills | Status: AC | PRN

## 2020-08-03 MED ORDER — OXYCODONE HCL 5 MG PO CAPS: 5.0000 mg | ORAL_CAPSULE | Freq: Four times a day (QID) | ORAL | 0 refills | Status: DC | PRN

## 2020-08-03 MED ORDER — OXYCODONE HCL 5 MG PO CAPS: 5.0000 mg | ORAL_CAPSULE | Freq: Four times a day (QID) | ORAL | 0 refills | Status: AC | PRN

## 2020-08-03 MED ORDER — METHOCARBAMOL 500 MG PO TABS: 500.0000 mg | ORAL_TABLET | Freq: Four times a day (QID) | ORAL | 0 refills | Status: DC | PRN

## 2020-08-03 MED ORDER — MORPHINE SULFATE (CONCENTRATE) 10 MG /0.5 ML PO SOLN: 10.0000 mg | ORAL | 0 refills | Status: AC | PRN

## 2020-08-03 MED ORDER — ACETAMINOPHEN 500 MG PO TABS: 1000.0000 mg | ORAL_TABLET | Freq: Three times a day (TID) | ORAL | 0 refills | Status: AC

## 2020-08-03 NOTE — Progress Notes (Signed)
PT Cancellation Note  Patient Details Name: Mitchell Stewart MRN: [id number] DOB: October 26, 1940   Cancelled Treatment:    Reason Eval/Treat Not Completed: Other (comment) per CSW, no PT eval needed for return to SNF. Discussed case with MD due to possibility of transition to comfort care and in agreement for PT to sign off. Please reconsult if new therapy needs arise.     Windell Norfolk, DPT, PN1   Supplemental Physical Therapist [facility]    Pager [phone] Acute Rehab Office [phone]

## 2020-08-03 NOTE — Progress Notes (Signed)
OT Cancellation Note  Patient Details Name: JESON CAMACHO MRN: [id number] DOB: 09-21-1940   Cancelled Treatment:    Reason Eval/Treat Not Completed: Other (comment) (non operative management, Pt with DC orders to return to SNF. OT will defer evaluation and treatment to next venue of care)  Jaci Carrel 08/03/2020, 11:46 AM   Jesse Sans OTR/L Acute Rehabilitation Services Pager: [phone] Office: [phone]

## 2020-08-03 NOTE — Discharge Summary (Signed)
Physician Discharge Summary  Mitchell Stewart OXB:[id number] DOB: August 17, 1941 DOA: 08/02/2020  PCP: Lesia Hausen, PA  Admit date: 08/02/2020 Discharge date: 08/03/2020  Admitted From: ALF Disposition: ALF  Recommendations for Outpatient Follow-up:  1. Follow ups as below. 2. Palliative care follow-up at SNF 3. Please follow up on the following pending results: None  Home Health: None Equipment/Devices: None  Discharge Condition: Stable CODE STATUS: DNR/DNI   Contact information for after-discharge care    [facility] .   Service: Skilled Nursing Contact information: [place] [phone]                 HPI: Per Dr. per Dr. Karmen Bongo 79 y.o. male with medical history significant of prostate CA; HLD; DM; and dementia presenting with a fall.  He was previously admitted to [facility] from 10/21-28 from home with a fall resulting in a C-7 traumatic fracture that was repaired by neurosurgery and he was discharged to SNF.  "All he's gonna do is fall", frequent falls, would not respond well to the surgery.  Both [place] and Tore are in agreement with progression towards comfort care, no surgical intervention.  He was just moved to this facility yesterday, not even 24 hours there.  They are in agreement with palliative care.     ED Course:   L femur fracture, Dr. Lucia Gaskins consulted.  H/o dementia, found down at SNF.  Has left forehead lac.  C-spine with ?metastatic disease, no further treatment due to deterioration.  Oriented to self only, which is his baseline.  Hospital Course: Patient admitted as such. After discussion meeting with orthopedic surgery, Shanna (patient's son and [facility] POA) opted for nonoperative management with emphasis on comfort care and palliative follow-up at SNF. Per orthopedic surgery, patient may mobilize as tolerated. He will be discharged on Tylenol, Robaxin, oxycodone and Roxanol based on pain  severity.  Palliative medicine met with Shanon Brow and discussed goal of care.  Plan is discharged back to ALF with palliative care follow-up outpatient with an option to transition to hospice.   See individual problem list below for more on hospital course.  Discharge Diagnoses:  Accidental fall at nursing home Recurrent fall Acute nondisplaced left femoral intertrochanteric fracture due to accidental fall -Family opted for nonoperative management with emphasis on comfort care after discussion with Ortho -Per Ortho, touchdown weightbearing as tolerated -Scheduled Tylenol with as needed Robaxin, oxycodone and Roxanol for pain control -Palliative/hospice follow-up at SNF -Aggressive bowel regimen with opiates -Fall precautions  Uncontrolled IDDM-2 with hyperglycemia Recent Labs  Lab 08/02/20 1308 08/02/20 2331 08/03/20 0650 08/03/20 1135  GLUCAP 147* 219* 257* 138*  -Continue home medications -Liberate diet given his advanced dementia and malnutrition  Prostate CA last seen by oncology in 2020. He was offered rad onc, med onc, and biopsy but patient declined all and opted for watchful waiting. Polaris scoring indicated more aggressive disease. 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 So discontinued  BPH -ContinueFlomax  DNR/Goals of Care: Palliative medicine consulted and met with patient's son. -Discharge back to ALF with palliative care follow-up with an option to transition to hospice  Moderate  malnutrition: As evidenced by significant muscle mass and subcu fat loss and low BMI. Body  mass index is 20.18 kg/m.  -Liberate diet. -Aspiration precautions     Family communication: Discussed with Pascual, patient's son over the phone.  All questions answered.    Discharge Exam: Vitals:   08/03/20 0531 08/03/20 0808  BP: 136/78 127/77  Pulse: (!) 103 91  Resp: 19 17  Temp: 98.3 F (36.8 C) 98 F (36.7 C)  SpO2: 99% 100%    GENERAL: Frail looking elderly male. No apparent distress.Marland Kitchen HEENT: MMM.  Vision and hearing grossly intact.  NECK: Supple.  No apparent JVD.  RESP: On RA. No IWOB.  Fair aeration bilaterally. CVS:  RRR. Heart sounds normal.  ABD/GI/GU: Bowel sounds present. Soft. Non tender.  MSK/EXT:  Moves extremities. Significant muscle mass and subcu fat loss. SKIN: no apparent skin lesion or wound NEURO: Sleepy but wakes to voice. Oriented x0. Does not follows command. No apparent focal neuro deficit. PSYCH: Calm. No distress or agitation.  Discharge Instructions  Discharge Instructions    Amb Referral to Palliative Care   Complete by: As directed    Diet - low sodium heart healthy   Complete by: As directed    Diet general   Complete by: As directed    Increase activity slowly   Complete by: As directed    Increase activity slowly   Complete by: As directed    No wound care   Complete by: As directed    No wound care   Complete by: As directed      Allergies as of 08/03/2020   No Known Allergies     Medication List    STOP taking these medications   pravastatin 20 MG tablet Commonly known as: PRAVACHOL   predniSONE 10 MG tablet Commonly known as: DELTASONE     TAKE these medications   acetaminophen 500 MG tablet Commonly known as: TYLENOL Take 2 tablets (1,000 mg total) by mouth every 8 (eight) hours for 10 days.   amLODipine 5 MG tablet Commonly known as: NORVASC Take 1 tablet (5 mg total) by mouth daily.   Basaglar KwikPen 100 UNIT/ML Inject 7 Units into the skin 2 (two) times daily.   insulin lispro 100 UNIT/ML  injection Commonly known as: HUMALOG Inject 2-10 Units into the skin See admin instructions. Inject 2-10 units subcutaneously three times daily before meals and at bedtime per sliding scale: CBG 0-199 0 units, 200-250 2 units, 251-300 4 units, 301-350 6 units, 351-400 8 units, 401-450 10 units, >450 10 units and call MD/ recheck in 1-2 hours   linagliptin 5 MG Tabs tablet Commonly known as: TRADJENTA Take 5 mg by mouth daily.   loratadine 10 MG tablet Commonly known as: CLARITIN Take 10 mg by mouth daily.   methocarbamol 500 MG tablet Commonly known as: ROBAXIN Take 1 tablet (500 mg total) by mouth every 6 (six) hours as needed for muscle spasms.   metoprolol tartrate 25 MG tablet Commonly known as: LOPRESSOR Take 25 mg by mouth 2 (two) times daily.   mirtazapine 7.5 MG tablet Commonly known as: REMERON Take 7.5 mg by mouth at bedtime.   montelukast 10 MG tablet Commonly known as: SINGULAIR Take 10 mg by mouth daily.   morphine CONCENTRATE 10 mg / 0.5 ml concentrated solution Take 0.5 mLs (10 mg total) by mouth every 4 (four) hours as needed for severe pain, anxiety or shortness of breath.   OLANZapine 10 MG tablet  Commonly known as: ZYPREXA Take 10 mg by mouth at bedtime.   oxycodone 5 MG capsule Commonly known as: OXY-IR Take 1 capsule (5 mg total) by mouth every 6 (six) hours as needed (moderate pain).   polyethylene glycol 17 g packet Commonly known as: MiraLax Take 17 g by mouth daily as needed. What changed: reasons to take this   sertraline 100 MG tablet Commonly known as: ZOLOFT Take 150 mg by mouth daily.   tamsulosin 0.4 MG Caps capsule Commonly known as: FLOMAX Take 0.4 mg by mouth daily at 6 PM.       Consultations:  Orthopedic surgery  Palliative medicine  Procedures/Studies:  None  DG Shoulder Right  Result Date: 08/02/2020 CLINICAL DATA:  Fall, pain EXAM: RIGHT SHOULDER - 2+ VIEW COMPARISON:  None. FINDINGS: There is no evidence of  fracture or dislocation. Mild right acromioclavicular arthrosis. Soft tissues are unremarkable. IMPRESSION: No fracture or dislocation of the right shoulder. Mild right acromioclavicular arthrosis. Electronically Signed   By: Eddie Candle M.D.   On: 08/02/2020 15:25   CT Head Wo Contrast  Result Date: 08/02/2020 CLINICAL DATA:  Fall striking left forehead, laceration.  Neck pain EXAM: CT HEAD WITHOUT CONTRAST CT CERVICAL SPINE WITHOUT CONTRAST TECHNIQUE: Multidetector CT imaging of the head and cervical spine was performed following the standard protocol without intravenous contrast. Multiplanar CT image reconstructions of the cervical spine were also generated. COMPARISON:  CT cervical spine 06/03/2020, CT head 06/03/2020, chest x-ray 06/03/2020 FINDINGS: CT HEAD FINDINGS Brain: Cerebral ventricle sizes are concordant with the degree of cerebral volume loss. Patchy and confluent areas of decreased attenuation are noted throughout the deep and periventricular white matter of the cerebral hemispheres bilaterally, compatible with chronic microvascular ischemic disease. No evidence of large-territorial acute infarction. No parenchymal hemorrhage. No mass lesion. No extra-axial collection. No mass effect or midline shift. No hydrocephalus. Basilar cisterns are patent. Vascular: No hyperdense vessel. Skull: No acute fracture or focal lesion. Sinuses/Orbits: Mucosal polyp within the left maxillary sinus. Otherwise the paranasal sinuses and mastoid air cells are clear. The orbits are unremarkable. Other: None. CT CERVICAL SPINE FINDINGS Alignment: Redemonstration of grade 1 anterolisthesis of the surgerized levels of C6 on C7. Similar grade 1 anterolisthesis of C4 on C5. Otherwise unremarkable. Skull base and vertebrae: Redemonstration of C6-C7 anterior fusion and interbody cage. Healed C6 fracture. No acute fracture. Redemonstration of a densely sclerotic and slightly irregular lesion of the left T3 lamina, pedicle,  and transverse process. Soft tissues and spinal canal: No prevertebral fluid or swelling. No visible canal hematoma. Disc levels:  Maintained. Upper chest: Linear atelectasis versus scarring within the left apex. No pneumothorax. Other: Interval resolution of subcutaneus soft tissue emphysema. IMPRESSION: 1. No acute intracranial abnormality. 2. No acute displaced fracture or traumatic listhesis of the cervical spine. 3. Indeterminate similar-appearing densely sclerotic lesion of the left T3 lamina, pedicle, and transverse process. Finding could represent metastasis in a patient with prostate cancer. These results were called by telephone at the time of interpretation on 08/02/2020 at 4:05 pm to provider PA [facility] , who verbally acknowledged these results. Electronically Signed   By: Iven Finn M.D.   On: 08/02/2020 16:07   CT Cervical Spine Wo Contrast  Result Date: 08/02/2020 CLINICAL DATA:  Fall striking left forehead, laceration.  Neck pain EXAM: CT HEAD WITHOUT CONTRAST CT CERVICAL SPINE WITHOUT CONTRAST TECHNIQUE: Multidetector CT imaging of the head and cervical spine was performed following the standard protocol without intravenous contrast. Multiplanar  CT image reconstructions of the cervical spine were also generated. COMPARISON:  CT cervical spine 06/03/2020, CT head 06/03/2020, chest x-ray 06/03/2020 FINDINGS: CT HEAD FINDINGS Brain: Cerebral ventricle sizes are concordant with the degree of cerebral volume loss. Patchy and confluent areas of decreased attenuation are noted throughout the deep and periventricular white matter of the cerebral hemispheres bilaterally, compatible with chronic microvascular ischemic disease. No evidence of large-territorial acute infarction. No parenchymal hemorrhage. No mass lesion. No extra-axial collection. No mass effect or midline shift. No hydrocephalus. Basilar cisterns are patent. Vascular: No hyperdense vessel. Skull: No acute fracture or focal  lesion. Sinuses/Orbits: Mucosal polyp within the left maxillary sinus. Otherwise the paranasal sinuses and mastoid air cells are clear. The orbits are unremarkable. Other: None. CT CERVICAL SPINE FINDINGS Alignment: Redemonstration of grade 1 anterolisthesis of the surgerized levels of C6 on C7. Similar grade 1 anterolisthesis of C4 on C5. Otherwise unremarkable. Skull base and vertebrae: Redemonstration of C6-C7 anterior fusion and interbody cage. Healed C6 fracture. No acute fracture. Redemonstration of a densely sclerotic and slightly irregular lesion of the left T3 lamina, pedicle, and transverse process. Soft tissues and spinal canal: No prevertebral fluid or swelling. No visible canal hematoma. Disc levels:  Maintained. Upper chest: Linear atelectasis versus scarring within the left apex. No pneumothorax. Other: Interval resolution of subcutaneus soft tissue emphysema. IMPRESSION: 1. No acute intracranial abnormality. 2. No acute displaced fracture or traumatic listhesis of the cervical spine. 3. Indeterminate similar-appearing densely sclerotic lesion of the left T3 lamina, pedicle, and transverse process. Finding could represent metastasis in a patient with prostate cancer. These results were called by telephone at the time of interpretation on 08/02/2020 at 4:05 pm to provider PA [facility] , who verbally acknowledged these results. Electronically Signed   By: Iven Finn M.D.   On: 08/02/2020 16:07   DG Hip Unilat W or Wo Pelvis 2-3 Views Left  Result Date: 08/02/2020 CLINICAL DATA:  Status post fall. EXAM: DG HIP (WITH OR WITHOUT PELVIS) 2-3V LEFT COMPARISON:  None. FINDINGS: An acute nondisplaced fracture is seen extending through the inter trochanteric region of the proximal left femur. There is no evidence of dislocation. Mild degenerative changes seen involving the left hip, in the form of joint space narrowing and acetabular sclerosis. IMPRESSION: Acute nondisplaced intertrochanteric  fracture of the proximal left femur. Electronically Signed   By: Virgina Norfolk M.D.   On: 08/02/2020 15:27   DG Hip Unilat W or Wo Pelvis 2-3 Views Right  Result Date: 08/02/2020 CLINICAL DATA:  Status post fall. EXAM: DG HIP (WITH OR WITHOUT PELVIS) 2-3V RIGHT COMPARISON:  None. FINDINGS: An acute nondisplaced fracture is seen extending through the inter trochanteric region of the proximal left femur. There is no evidence of dislocation. Mild degenerative changes seen involving both hips, in the form of joint space narrowing and acetabular sclerosis. IMPRESSION: Acute nondisplaced intertrochanteric fracture of the proximal left femur. Electronically Signed   By: Virgina Norfolk M.D.   On: 08/02/2020 15:28   DG Femur Min 2 Views Left  Result Date: 08/02/2020 CLINICAL DATA:  Status post fall. EXAM: LEFT FEMUR 2 VIEWS COMPARISON:  None. FINDINGS: An acute nondisplaced fracture is seen extending through the inter trochanteric region of the proximal left femur. There is no evidence of dislocation. Soft tissues are unremarkable. IMPRESSION: Acute nondisplaced intertrochanteric fracture of the proximal left femur. Electronically Signed   By: Virgina Norfolk M.D.   On: 08/02/2020 15:25       The  results of significant diagnostics from this hospitalization (including imaging, microbiology, ancillary and laboratory) are listed below for reference.     Microbiology: Recent Results (from the past 240 hour(s))  Resp Panel by RT-PCR (Flu A&B, Covid) Nasopharyngeal Swab     Status: None   Collection Time: 08/02/20  5:37 PM   Specimen: Nasopharyngeal Swab; Nasopharyngeal(NP) swabs in vial transport medium  Result Value Ref Range Status   SARS Coronavirus 2 by RT PCR NEGATIVE NEGATIVE Final    Comment: (NOTE) SARS-CoV-2 target nucleic acids are NOT DETECTED.  The SARS-CoV-2 RNA is generally detectable in upper respiratory specimens during the acute phase of infection. The lowest concentration  of SARS-CoV-2 viral copies this assay can detect is 138 copies/mL. A negative result does not preclude SARS-Cov-2 infection and should not be used as the sole basis for treatment or other patient management decisions. A negative result may occur with  improper specimen collection/handling, submission of specimen other than nasopharyngeal swab, presence of viral mutation(s) within the areas targeted by this assay, and inadequate number of viral copies(<138 copies/mL). A negative result must be combined with clinical observations, patient history, and epidemiological information. The expected result is Negative.  Fact Sheet for Patients:  EntrepreneurPulse.com.au  Fact Sheet for Healthcare Providers:  IncredibleEmployment.be  This test is no t yet approved or cleared by the [place] FDA and  has been authorized for detection and/or diagnosis of SARS-CoV-2 by FDA under an Emergency Use Authorization (EUA). This EUA will remain  in effect (meaning this test can be used) for the duration of the COVID-19 declaration under Section 564(b)(1) of the Act, 21 U.S.C.section 360bbb-3(b)(1), unless the authorization is terminated  or revoked sooner.       Influenza A by PCR NEGATIVE NEGATIVE Final   Influenza B by PCR NEGATIVE NEGATIVE Final    Comment: (NOTE) The Xpert Xpress SARS-CoV-2/FLU/RSV plus assay is intended as an aid in the diagnosis of influenza from Nasopharyngeal swab specimens and should not be used as a sole basis for treatment. Nasal washings and aspirates are unacceptable for Xpert Xpress SARS-CoV-2/FLU/RSV testing.  Fact Sheet for Patients: EntrepreneurPulse.com.au  Fact Sheet for Healthcare Providers: IncredibleEmployment.be  This test is not yet approved or cleared by the [place] FDA and has been authorized for detection and/or diagnosis of SARS-CoV-2 by FDA under an Emergency Use  Authorization (EUA). This EUA will remain in effect (meaning this test can be used) for the duration of the COVID-19 declaration under Section 564(b)(1) of the Act, 21 U.S.C. section 360bbb-3(b)(1), unless the authorization is terminated or revoked.  Performed at [facility], [place] [address], [place], [place] [address]      Labs: BNP (last 3 results) No results for input(s): BNP in the last 8760 hours. Basic Metabolic Panel: Recent Labs  Lab 08/02/20 1750 08/03/20 0243  NA 140 135  K 4.2 4.8  CL 103 99  CO2 24 23  GLUCOSE 63* 252*  BUN 18 16  CREATININE 0.94 1.00  CALCIUM 9.4 9.1   Liver Function Tests: No results for input(s): AST, ALT, ALKPHOS, BILITOT, PROT, ALBUMIN in the last 168 hours. No results for input(s): LIPASE, AMYLASE in the last 168 hours. No results for input(s): AMMONIA in the last 168 hours. CBC: Recent Labs  Lab 08/02/20 1750 08/03/20 0243  WBC 19.5* 17.0*  NEUTROABS 17.9*  --   HGB 14.0 12.5*  HCT 44.3 38.4*  MCV 88.1 85.7  PLT 249 221   Cardiac Enzymes: No  results for input(s): CKTOTAL, CKMB, CKMBINDEX, TROPONINI in the last 168 hours. BNP: Invalid input(s): POCBNP CBG: Recent Labs  Lab 08/02/20 1308 08/02/20 2331 08/03/20 0650 08/03/20 1135  GLUCAP 147* 219* 257* 138*   D-Dimer No results for input(s): DDIMER in the last 72 hours. Hgb A1c No results for input(s): HGBA1C in the last 72 hours. Lipid Profile No results for input(s): CHOL, HDL, LDLCALC, TRIG, CHOLHDL, LDLDIRECT in the last 72 hours. Thyroid function studies No results for input(s): TSH, T4TOTAL, T3FREE, THYROIDAB in the last 72 hours.  Invalid input(s): FREET3 Anemia work up No results for input(s): VITAMINB12, FOLATE, FERRITIN, TIBC, IRON, RETICCTPCT in the last 72 hours. Urinalysis    Component Value Date/Time   COLORURINE YELLOW (A) 06/06/2020 0739   APPEARANCEUR CLOUDY (A) 06/06/2020 0739   LABSPEC 1.024 06/06/2020 0739   PHURINE 5.0 06/06/2020  0739   GLUCOSEU >=500 (A) 06/06/2020 0739   HGBUR NEGATIVE 06/06/2020 0739   BILIRUBINUR NEGATIVE 06/06/2020 0739   KETONESUR 20 (A) 06/06/2020 0739   PROTEINUR 30 (A) 06/06/2020 0739   NITRITE NEGATIVE 06/06/2020 0739   LEUKOCYTESUR NEGATIVE 06/06/2020 0739   Sepsis Labs Invalid input(s): PROCALCITONIN,  WBC,  LACTICIDVEN   Time coordinating discharge: 45 minutes  SIGNED:  Mercy Riding, MD  [facility] 08/03/2020, 1:16 PM  If 7PM-7AM, please contact night-coverage [email or web address]

## 2020-08-03 NOTE — Consult Note (Signed)
Consultation Note Date: 08/03/2020   Patient Name: Mitchell Stewart  DOB: 26-Jul-1941  MRN: [id number]  Age / Sex: 79 y.o., male  PCP: Mitchell Stewart, [place] Referring Physician: Mercy Riding, MD  Reason for Consultation: Establishing goals of care  HPI/Patient Profile: 79 y.o. male  with past medical history of prostate cancer, dementia, recurrent falls, and DM presented to the ED from [facility] after a fall.  Patient was admitted on 08/02/2020 with left hip fracture. Family decided not to pursue surgical intervention at this time.  Patient and family face treatment option decisions, advanced directive decisions, and anticipatory care needs.   Clinical Assessment and Goals of Care: I have reviewed medical records including EPIC notes, labs, and imaging. Received report from primary RN - no acute concerns. She reports patient was able to eat some of his breakfast with assistance feeding. RN also states she spoke with patient's primary HCPOA/Mitchell Stewart (who called the unit). RN states Mitchell Stewart reports she and her brother have been in contact about the patient's current situation and plan of care and are agreeable to decisions thus far.   Went to visit patient at bedside - no family/visitors present. Patient was lying in bed asleep - he did wake to voice/gentle touch but promptly fell back asleep. No signs or non-verbal gestures of pain or discomfort noted. No respiratory distress, increased work of breathing, or secretions noted.   Met with son/Mitchell Stewart via phone (only contact listed in EHR) to discuss diagnosis, prognosis, GOC, EOL wishes, disposition, and options.  I introduced Palliative Medicine as specialized medical care for people living with serious illness. It focuses on providing relief from the symptoms and stress of a serious illness. The goal is to improve quality of life for both the patient and the family.  We  discussed a brief life review of the patient as well as functional and nutritional status. Mitchell Stewart reports the patient is not married, but has 2 daughters and 1 son. Prior to hospitalization, the patient was a [employer] for [place]; however, apparently the patient had been there for less than 24 hours before his fall and admission to the hospital. Mitchell Stewart tells me he has noticed a mental decline in the patient for the past 2 years. Prior to April 2021, the patient was living alone - Mitchell Stewart explains that after this time, the patient has had significant mental as well as functional decline and has lived in assisted living, memory care unit, with one of the patient's daughters, Mitchell Stewart himself, [facility], and now [employer]. Since April, Conall tells me the patient has experienced multiple/recurrent falls because he does not understand his limitations. The patient has become incontinent and unable to feed himself, but does eat well when he has assistance. The patient is very weak and frail, which leads to his falls when he tries to ambulate.   We discussed patient's current illness and what it means in the larger context of patient's on-going co-morbidities. Mitchell Stewart has a clear understanding of the patient's current medical situation  as well as a clear holistic view of the patient, which is why the family opted not to pursue surgical intervention for the patient's left hip fracture. Natural disease trajectory and expectations at EOL were discussed. I attempted to elicit values and goals of care important to the patient. The difference between aggressive medical intervention and comfort care was considered in light of the patient's goals of care. Kainon understands that dementia is a progressive, non-curable disease underlying the patient's current acute medical conditions.    I discussed with Mitchell Stewart the patient's recently diagnosed prostate cancer - he verifies that they are not pursuing intervention at this time due to  side effects. The family's goal is to keep the patient as comfortable as they can for the time he has left.   Hospice and Palliative Care services outpatient were explained and offered. Arif explains that he feels hospice is the best option for the patient moving forward; however, depending on insurance, it is not clear if the patient would be able to receive hospice services as well as continue LTC at [facility] without paying out of pocket. [place] is agreeable to outpatient Palliative Care if hospice is not an option. Onalee Stewart requested [employer]. Offered to call and discuss with Mitchell Stewart, who is primary HCPOA - Mitchell Stewart states they are in close communication.   Spoke with [facility] liaison and TOC about family's desire for hospice and questions about insurance. After [facility] discussion with Mitchell Stewart, it was decided the patient would discharge with outpatient Palliative Care services as the family cannot pay out of pocket for LTC for the patient.  Mitchell Stewart understands the patient is at high risk of rehospitalization. He expressed frustration with Mitchell Stewart for not notifying him they brought the patient to the ED, he wished they would have called him first to discuss. Explained that he can call [facility] if any acute events happen to discuss GOC in the future before bringing the patient to hospital if his goal is to prevent rehospitalization.  Discussed with son/Mitchell Stewart the importance of continued conversation with sisters and the medical providers regarding overall plan of care and treatment options, ensuring decisions are within the context of the patient's values and GOCs.    Questions and concerns were addressed. The patient/family was encouraged to call with questions and/or concerns. PMT number was provided.   Primary Decision Maker: HCPOA  Mitchell Stewart - HCPOA #1 Mitchell Stewart. - HCPOA #2 [name]    SUMMARY OF RECOMMENDATIONS:  Continue current medical treatment without  invasive procedures  Continue DNR/DNI as previously documented - durable DNR form completed and placed in patient's shadow chart; copy will be scanned into [employer]  Family's goal is to keep the patient as comfortable as they can for the time he has left. They were agreeable to hospice services at [facility]; however, family is not able to pay out of pocket for LTC facility if Medicare covers hospice instead. Therefore, they opted to not receive hospice services due to finances and will continue LTC at [facility]  Family agreeable to outpatient Palliative Care - TOC and hospice liaison notified, [facility] consult placed  PMT will continue to follow peripherally. If there are any imminent needs please call the service directly  Code Status/Advance Care Planning:  DNR  Palliative Prophylaxis:   Aspiration, Bowel Regimen, Delirium Protocol, Frequent Pain Assessment, Palliative Wound Care and Turn Reposition  Additional Recommendations (Limitations, Scope, Preferences):  Avoid Hospitalization, No Artificial Feeding, No Chemotherapy, No  Diagnostics, No Hemodialysis and No Surgical Procedures  Psycho-social/Spiritual:   Desire for further Chaplaincy support:no Created space and opportunity for patient and family to express thoughts and feelings regarding patient's current medical situation.   Emotional support and therapeutic listening provided.  Prognosis:   Poor prognosis considering patient's advanced age, recurrent falls, comorbidities, and underlying chronic disease  Discharge Planning: LTC with outpatient Palliative Care to follow     Primary Diagnoses: Present on Admission: . Closed left hip fracture, initial encounter ([place]) . Dementia with behavioral disturbance ([place]) . Forehead contusion . Prostate cancer metastatic to multiple sites [facility]) . Dyslipidemia . Closed left femoral fracture ([place])   I have reviewed the medical record, interviewed the patient and family, and  examined the patient. The following aspects are pertinent.  Past Medical History:  Diagnosis Date  . Dementia ([place])   . Depression   . Diabetes mellitus without complication ([place])   . Erectile dysfunction   . Hard of hearing   . History of diverticulitis   . History of kidney stones   . Hyperlipidemia   . Osteoporosis   . Prostate cancer [facility])    Social History   Socioeconomic History  . Marital status: Legally Separated    Spouse name: Not on file  . Number of children: Not on file  . Years of education: Not on file  . Highest education level: Not on file  Occupational History  . Not on file  Tobacco Use  . Smoking status: Never Smoker  . Smokeless tobacco: Never Used  Substance and Sexual Activity  . Alcohol use: Not on file  . Drug use: Not on file  . Sexual activity: Not on file  Other Topics Concern  . Not on file  Social History Narrative  . Not on file   Social Determinants of Health   Financial Resource Strain: Not on file  Food Insecurity: Not on file  Transportation Needs: Not on file  Physical Activity: Not on file  Stress: Not on file  Social Connections: Not on file   No family history on file. Scheduled Meds: . amLODipine  5 mg Oral Daily  . docusate sodium  100 mg Oral BID  . enoxaparin (LOVENOX) injection  40 mg Subcutaneous Q24H  . insulin aspart  0-15 Units Subcutaneous TID WC  . loratadine  10 mg Oral Daily  . metoprolol tartrate  25 mg Oral BID  . mirtazapine  7.5 mg Oral QHS  . montelukast  10 mg Oral Daily  . OLANZapine  10 mg Oral QHS  . pravastatin  20 mg Oral q1800  . sertraline  150 mg Oral Daily  . tamsulosin  0.4 mg Oral q1800   Continuous Infusions: . methocarbamol (ROBAXIN) IV     PRN Meds:.bisacodyl, haloperidol lactate, HYDROcodone-acetaminophen, methocarbamol **OR** methocarbamol (ROBAXIN) IV, morphine injection, polyethylene glycol Medications Prior to Admission:  Prior to Admission medications   Medication Sig Start  Date End Date Taking? Authorizing Provider  amLODipine (NORVASC) 5 MG tablet Take 1 tablet (5 mg total) by mouth daily. 06/20/20  Yes Enzo Bi, MD  Insulin Glargine [facility]) 100 UNIT/ML Inject 7 Units into the skin 2 (two) times daily. 06/20/20  Yes Enzo Bi, MD  insulin lispro (HUMALOG) 100 UNIT/ML injection Inject 2-10 Units into the skin See admin instructions. Inject 2-10 units subcutaneously three times daily before meals and at bedtime per sliding scale: CBG 0-199 0 units, 200-250 2 units, 251-300 4 units, 301-350 6 units, 351-400 8 units,  401-450 10 units, >450 10 units and call MD/ recheck in 1-2 hours   Yes [provider]  linagliptin (TRADJENTA) 5 MG TABS tablet Take 5 mg by mouth daily.   Yes [provider]  loratadine (CLARITIN) 10 MG tablet Take 10 mg by mouth daily.   Yes [provider]  metoprolol tartrate (LOPRESSOR) 25 MG tablet Take 25 mg by mouth 2 (two) times daily. 07/30/20  Yes [provider]  mirtazapine (REMERON) 7.5 MG tablet Take 7.5 mg by mouth at bedtime.   Yes [provider]  montelukast (SINGULAIR) 10 MG tablet Take 10 mg by mouth daily. 07/27/20  Yes [provider]  OLANZapine (ZYPREXA) 10 MG tablet Take 10 mg by mouth at bedtime.   Yes [provider]  polyethylene glycol (MIRALAX) 17 g packet Take 17 g by mouth daily as needed. Patient taking differently: Take 17 g by mouth daily as needed (constipation). 06/07/20  Yes Dow Adolph N, DO  pravastatin (PRAVACHOL) 20 MG tablet Take 1 tablet (20 mg total) by mouth daily at 6 PM. 06/07/20 09/05/20 Yes Hall, Oliver Pila, DO  predniSONE (DELTASONE) 10 MG tablet Take 10 mg by mouth See admin instructions. Take one tablet (10 mg) by mouth for 2 days - 08/02/20 and 08/03/20 - for cough 07/26/20  Yes [provider]  sertraline (ZOLOFT) 100 MG tablet Take 150 mg by mouth daily. 05/30/20  Yes [provider]  tamsulosin (FLOMAX) 0.4 MG CAPS  capsule Take 0.4 mg by mouth daily at 6 PM. 05/30/20  Yes [provider]  acetaminophen (TYLENOL) 500 MG tablet Take 2 tablets (1,000 mg total) by mouth every 8 (eight) hours for 10 days. 08/03/20 08/13/20  Almon Hercules, MD  methocarbamol (ROBAXIN) 500 MG tablet Take 1 tablet (500 mg total) by mouth every 6 (six) hours as needed for muscle spasms. 08/03/20   Almon Hercules, MD  Morphine Sulfate (MORPHINE CONCENTRATE) 10 mg / 0.5 ml concentrated solution Take 0.5 mLs (10 mg total) by mouth every 4 (four) hours as needed for severe pain, anxiety or shortness of breath. 08/03/20   Almon Hercules, MD  oxycodone (OXY-IR) 5 MG capsule Take 1 capsule (5 mg total) by mouth every 6 (six) hours as needed (moderate pain). 08/03/20   Almon Hercules, MD   No Known Allergies Review of Systems  Unable to perform ROS: Acuity of condition    Physical Exam Vitals and nursing note reviewed.  Constitutional:      General: He is not in acute distress.    Appearance: He is cachectic. He is ill-appearing.     Comments: Frail appearing  Pulmonary:     Effort: No respiratory distress.  Skin:    General: Skin is warm and dry.  Neurological:     Mental Status: He is lethargic.     Motor: Weakness present.  Psychiatric:        Speech: He is noncommunicative.        Behavior: Behavior is slowed.     Vital Signs: BP 127/77 (BP Location: Right Arm)   Pulse 91   Temp 98 F (36.7 C) (Oral)   Resp 17   Ht 5\' 6"  (1.676 m)   Wt 56.7 kg   SpO2 100%   BMI 20.18 kg/m  Pain Scale: PAINAD   Pain Score: 0-No pain   SpO2: SpO2: 100 % O2 Device:SpO2: 100 % O2 Flow Rate: .   IO: Intake/output summary: No intake or  output data in the 24 hours ending 08/03/20 1245  LBM: Last BM Date:  (PTA) Baseline Weight: Weight: 56.7 kg Most recent weight: Weight: 56.7 kg     Palliative Assessment/Data: PPS 40%     Time In: 1200 Time Out: 1312 Time Total: 72 minutes  Greater than 50%  of this time was spent  counseling and coordinating care related to the above assessment and plan.  Signed by: Lin Landsman, NP   Please contact Palliative Medicine Team phone at [phone] for questions and concerns.  For individual provider: See Shea Evans

## 2020-08-03 NOTE — Care Management CC44 (Signed)
Condition Code 44 Documentation Completed  Patient Details  Name: Mitchell Stewart MRN: [id number] Date of Birth: 1941-02-09   Condition Code 44 given:  Yes Patient signature on Condition Code 44 notice:  Yes Documentation of 2 MD's agreement:  Yes Code 44 added to claim:  Yes    Marilu Favre, RN 08/03/2020, 11:33 AM

## 2020-08-03 NOTE — Plan of Care (Signed)
Patient arrived to floor 0130. Patient VS stable. No complaints of pain unless turning pt. Pt resting well. Will continue to monitor pt. Sacral pad added to bottom to prevent pressure sore.    Problem: Education: Goal: Knowledge of General Education information will improve Description: Including pain rating scale, medication(s)/side effects and non-pharmacologic comfort measures Outcome: Progressing   Problem: Pain Managment: Goal: General experience of comfort will improve Outcome: Progressing   Problem: Safety: Goal: Ability to remain free from injury will improve Outcome: Progressing   Problem: Skin Integrity: Goal: Risk for impaired skin integrity will decrease Outcome: Progressing

## 2020-08-03 NOTE — Progress Notes (Signed)
Manufacturing engineer [facility])  Hospital Liaison RN note         Notified by [facility] manager of patient/family request for [facility] Palliative services at home after discharge.         Writer spoke with patient's son Mitchell Stewart  to confirm interest and explain services. Education offered regarding hospice services vs. Palliative services.  Mitchell Stewart  verbalized desire for hospice support but reports cost for paying for SNF out of pocket would be prohibitive and is not an option.   Plan made for outpatient palliative support at [facility].        [facility] Palliative team will follow up with patient after discharge.         Please call with any hospice or palliative related questions.         Thank you for the opportunity to participate in this patient's care.     Chrislyn Edison Pace, BSN, RN [place] (listed on AMION under Hospice/[phone]

## 2020-08-03 NOTE — Care Management Obs Status (Signed)
[place] NOTIFICATION   Patient Details  Name: Mitchell Stewart MRN: [id number] Date of Birth: Jun 02, 1941   Medicare Observation Status Notification Given:  Yes    Marilu Favre, RN 08/03/2020, 11:33 AM

## 2020-08-03 NOTE — TOC Transition Note (Signed)
Transition of Care [facility]) - CM/SW Discharge Note   Patient Details  Name: ROSENDO COUSER MRN: [id number] Date of Birth: 1940/09/15  Transition of Care [facility]) CM/SW Contact:  Joanne Chars, LCSW Phone Number: 08/03/2020, 1:45 PM   Clinical Narrative:  Pt discharging back to [facility], [place].  RN call report to [phone].      Final next level of care: Long Term Nursing Home Barriers to Discharge: No Barriers Identified   Patient Goals and CMS Choice        Discharge Placement              Patient chooses bed at: [facility] Patient to be transferred to facility by: [place] Name of family member notified: son, [place] Patient and family notified of of transfer: 08/03/20  Discharge Plan and Services                                     Social Determinants of Health (SDOH) Interventions     Readmission Risk Interventions No flowsheet data found.

## 2020-08-03 NOTE — Progress Notes (Addendum)
Pt will be discharge to [facility] today. Attempted to give report to [place] but not answering. Waiting for PTAR. 1800 Discharged pt to SNF via PTAR, discharge packet was given.

## 2020-08-03 NOTE — Consult Note (Signed)
Reason for Consult: Left intertrochanteric hip fracture Referring Physician: [facility]  Mitchell Stewart is an 79 y.o. male.  HPI: Patient had a fall at his nursing facility.  He was brought to the emergency department with inability to ambulate.  Per report patient is virtually nonambulatory at baseline.  X-rays in the emergency department revealed a left intertrochanteric hip fracture.  Patient was not indicating significant mount of pain.  Per family patient tries to ambulate but has had several falls recently.  Past Medical History:  Diagnosis Date  . Dementia ([place])   . Depression   . Diabetes mellitus without complication ([place])   . Erectile dysfunction   . Hard of hearing   . History of diverticulitis   . History of kidney stones   . Hyperlipidemia   . Osteoporosis   . Prostate cancer [facility])     Past Surgical History:  Procedure Laterality Date  . ANTERIOR CERVICAL DECOMP/DISCECTOMY FUSION N/A 06/03/2020   Procedure: ANTERIOR CERVICAL DECOMPRESSION/DISCECTOMY FUSION 1 LEVEL C6/7;  Surgeon: Deetta Perla, MD;  Location: [facility] ORS;  Service: Neurosurgery;  Laterality: N/A;    No family history on file.  Social History:  reports that he has never smoked. He has never used smokeless tobacco. No history on file for alcohol use and drug use.  Allergies: No Known Allergies  Medications: I have reviewed the patient's current medications.  Results for orders placed or performed during the hospital encounter of 08/02/20 (from the past 48 hour(s))  CBG monitoring, ED     Status: Abnormal   Collection Time: 08/02/20  1:08 PM  Result Value Ref Range   Glucose-Capillary 147 (H) 70 - 99 mg/dL    Comment: Glucose reference range applies only to samples taken after fasting for at least 8 hours.  Resp Panel by RT-PCR (Flu A&B, Covid) Nasopharyngeal Swab     Status: None   Collection Time: 08/02/20  5:37 PM   Specimen: Nasopharyngeal Swab; Nasopharyngeal(NP) swabs in vial  transport medium  Result Value Ref Range   SARS Coronavirus 2 by RT PCR NEGATIVE NEGATIVE    Comment: (NOTE) SARS-CoV-2 target nucleic acids are NOT DETECTED.  The SARS-CoV-2 RNA is generally detectable in upper respiratory specimens during the acute phase of infection. The lowest concentration of SARS-CoV-2 viral copies this assay can detect is 138 copies/mL. A negative result does not preclude SARS-Cov-2 infection and should not be used as the sole basis for treatment or other patient management decisions. A negative result may occur with  improper specimen collection/handling, submission of specimen other than nasopharyngeal swab, presence of viral mutation(s) within the areas targeted by this assay, and inadequate number of viral copies(<138 copies/mL). A negative result must be combined with clinical observations, patient history, and epidemiological information. The expected result is Negative.  Fact Sheet for Patients:  EntrepreneurPulse.com.au  Fact Sheet for Healthcare Providers:  IncredibleEmployment.be  This test is no t yet approved or cleared by the [place] FDA and  has been authorized for detection and/or diagnosis of SARS-CoV-2 by FDA under an Emergency Use Authorization (EUA). This EUA will remain  in effect (meaning this test can be used) for the duration of the COVID-19 declaration under Section 564(b)(1) of the Act, 21 U.S.C.section 360bbb-3(b)(1), unless the authorization is terminated  or revoked sooner.       Influenza A by PCR NEGATIVE NEGATIVE   Influenza B by PCR NEGATIVE NEGATIVE    Comment: (NOTE) The Xpert Xpress SARS-CoV-2/FLU/RSV plus assay is  intended as an aid in the diagnosis of influenza from Nasopharyngeal swab specimens and should not be used as a sole basis for treatment. Nasal washings and aspirates are unacceptable for Xpert Xpress SARS-CoV-2/FLU/RSV testing.  Fact Sheet for  Patients: EntrepreneurPulse.com.au  Fact Sheet for Healthcare Providers: IncredibleEmployment.be  This test is not yet approved or cleared by the [place] FDA and has been authorized for detection and/or diagnosis of SARS-CoV-2 by FDA under an Emergency Use Authorization (EUA). This EUA will remain in effect (meaning this test can be used) for the duration of the COVID-19 declaration under Section 564(b)(1) of the Act, 21 U.S.C. section 360bbb-3(b)(1), unless the authorization is terminated or revoked.  Performed at [facility], [place] [address], [place], [place] [address]   Basic metabolic panel     Status: Abnormal   Collection Time: 08/02/20  5:50 PM  Result Value Ref Range   Sodium 140 135 - 145 mmol/L   Potassium 4.2 3.5 - 5.1 mmol/L   Chloride 103 98 - 111 mmol/L   CO2 24 22 - 32 mmol/L   Glucose, Bld 63 (L) 70 - 99 mg/dL    Comment: Glucose reference range applies only to samples taken after fasting for at least 8 hours.   BUN 18 8 - 23 mg/dL   Creatinine, Ser 0.94 0.61 - 1.24 mg/dL   Calcium 9.4 8.9 - 10.3 mg/dL   GFR, Estimated >60 >60 mL/min    Comment: (NOTE) Calculated using the CKD-EPI Creatinine Equation (2021)    Anion gap 13 5 - 15    Comment: Performed at [facility] [address], [place], [place] [address]  CBC WITH DIFFERENTIAL     Status: Abnormal   Collection Time: 08/02/20  5:50 PM  Result Value Ref Range   WBC 19.5 (H) 4.0 - 10.5 K/uL   RBC 5.03 4.22 - 5.81 MIL/uL   Hemoglobin 14.0 13.0 - 17.0 g/dL   HCT 44.3 39.0 - 52.0 %   MCV 88.1 80.0 - 100.0 fL   MCH 27.8 26.0 - 34.0 pg   MCHC 31.6 30.0 - 36.0 g/dL   RDW 14.4 11.5 - 15.5 %   Platelets 249 150 - 400 K/uL   nRBC 0.0 0.0 - 0.2 %   Neutrophils Relative % 92 %   Neutro Abs 17.9 (H) 1.7 - 7.7 K/uL   Lymphocytes Relative 3 %   Lymphs Abs 0.6 (L) 0.7 - 4.0 K/uL   Monocytes Relative 4 %   Monocytes Absolute 0.8 0.1 - 1.0 K/uL   Eosinophils  Relative 0 %   Eosinophils Absolute 0.0 0.0 - 0.5 K/uL   Basophils Relative 0 %   Basophils Absolute 0.0 0.0 - 0.1 K/uL   Immature Granulocytes 1 %   Abs Immature Granulocytes 0.14 (H) 0.00 - 0.07 K/uL    Comment: Performed at [facility] [address], [place], [place] [address]  Protime-INR     Status: None   Collection Time: 08/02/20  5:50 PM  Result Value Ref Range   Prothrombin Time 13.2 11.4 - 15.2 seconds   INR 1.0 0.8 - 1.2    Comment: (NOTE) INR goal varies based on device and disease states. Performed at [facility], [place] [address], [place], [place] [address]   Type and screen [place]     Status: None   Collection Time: 08/02/20  5:50 PM  Result Value Ref Range   ABO/RH(D) O POS  Antibody Screen NEG    Sample Expiration      08/05/2020,2359 Performed at [facility], [place] [address], [place], [place] [address]   CBG monitoring, ED     Status: Abnormal   Collection Time: 08/02/20 11:31 PM  Result Value Ref Range   Glucose-Capillary 219 (H) 70 - 99 mg/dL    Comment: Glucose reference range applies only to samples taken after fasting for at least 8 hours.  CBC     Status: Abnormal   Collection Time: 08/03/20  2:43 AM  Result Value Ref Range   WBC 17.0 (H) 4.0 - 10.5 K/uL   RBC 4.48 4.22 - 5.81 MIL/uL   Hemoglobin 12.5 (L) 13.0 - 17.0 g/dL   HCT 38.4 (L) 39.0 - 52.0 %   MCV 85.7 80.0 - 100.0 fL   MCH 27.9 26.0 - 34.0 pg   MCHC 32.6 30.0 - 36.0 g/dL   RDW 14.4 11.5 - 15.5 %   Platelets 221 150 - 400 K/uL   nRBC 0.0 0.0 - 0.2 %    Comment: Performed at [facility], [place] [address], [place], [place] [address]  Basic metabolic panel     Status: Abnormal   Collection Time: 08/03/20  2:43 AM  Result Value Ref Range   Sodium 135 135 - 145 mmol/L   Potassium 4.8 3.5 - 5.1 mmol/L   Chloride 99 98 - 111 mmol/L   CO2 23 22 - 32 mmol/L   Glucose, Bld 252 (H) 70 - 99 mg/dL    Comment: Glucose reference range applies only  to samples taken after fasting for at least 8 hours.   BUN 16 8 - 23 mg/dL   Creatinine, Ser 1.00 0.61 - 1.24 mg/dL   Calcium 9.1 8.9 - 10.3 mg/dL   GFR, Estimated >60 >60 mL/min    Comment: (NOTE) Calculated using the CKD-EPI Creatinine Equation (2021)    Anion gap 13 5 - 15    Comment: Performed at [facility] [address], [place], [place] [address]  ABO/Rh     Status: None   Collection Time: 08/03/20  2:43 AM  Result Value Ref Range   ABO/RH(D)      O POS Performed at [facility] [address] [place], [place] [address]   Glucose, capillary     Status: Abnormal   Collection Time: 08/03/20  6:50 AM  Result Value Ref Range   Glucose-Capillary 257 (H) 70 - 99 mg/dL    Comment: Glucose reference range applies only to samples taken after fasting for at least 8 hours.    DG Shoulder Right  Result Date: 08/02/2020 CLINICAL DATA:  Fall, pain EXAM: RIGHT SHOULDER - 2+ VIEW COMPARISON:  None. FINDINGS: There is no evidence of fracture or dislocation. Mild right acromioclavicular arthrosis. Soft tissues are unremarkable. IMPRESSION: No fracture or dislocation of the right shoulder. Mild right acromioclavicular arthrosis. Electronically Signed   By: Eddie Candle M.D.   On: 08/02/2020 15:25   CT Head Wo Contrast  Result Date: 08/02/2020 CLINICAL DATA:  Fall striking left forehead, laceration.  Neck pain EXAM: CT HEAD WITHOUT CONTRAST CT CERVICAL SPINE WITHOUT CONTRAST TECHNIQUE: Multidetector CT imaging of the head and cervical spine was performed following the standard protocol without intravenous contrast. Multiplanar CT image reconstructions of the cervical spine were also generated. COMPARISON:  CT cervical spine 06/03/2020, CT head 06/03/2020, chest x-ray 06/03/2020 FINDINGS: CT HEAD FINDINGS Brain: Cerebral ventricle sizes are concordant  with the degree of cerebral volume loss. Patchy and confluent areas of decreased attenuation are noted throughout the deep and  periventricular white matter of the cerebral hemispheres bilaterally, compatible with chronic microvascular ischemic disease. No evidence of large-territorial acute infarction. No parenchymal hemorrhage. No mass lesion. No extra-axial collection. No mass effect or midline shift. No hydrocephalus. Basilar cisterns are patent. Vascular: No hyperdense vessel. Skull: No acute fracture or focal lesion. Sinuses/Orbits: Mucosal polyp within the left maxillary sinus. Otherwise the paranasal sinuses and mastoid air cells are clear. The orbits are unremarkable. Other: None. CT CERVICAL SPINE FINDINGS Alignment: Redemonstration of grade 1 anterolisthesis of the surgerized levels of C6 on C7. Similar grade 1 anterolisthesis of C4 on C5. Otherwise unremarkable. Skull base and vertebrae: Redemonstration of C6-C7 anterior fusion and interbody cage. Healed C6 fracture. No acute fracture. Redemonstration of a densely sclerotic and slightly irregular lesion of the left T3 lamina, pedicle, and transverse process. Soft tissues and spinal canal: No prevertebral fluid or swelling. No visible canal hematoma. Disc levels:  Maintained. Upper chest: Linear atelectasis versus scarring within the left apex. No pneumothorax. Other: Interval resolution of subcutaneus soft tissue emphysema. IMPRESSION: 1. No acute intracranial abnormality. 2. No acute displaced fracture or traumatic listhesis of the cervical spine. 3. Indeterminate similar-appearing densely sclerotic lesion of the left T3 lamina, pedicle, and transverse process. Finding could represent metastasis in a patient with prostate cancer. These results were called by telephone at the time of interpretation on 08/02/2020 at 4:05 pm to provider [facility] , who verbally acknowledged these results. Electronically Signed   By: Iven Finn M.D.   On: 08/02/2020 16:07   CT Cervical Spine Wo Contrast  Result Date: 08/02/2020 CLINICAL DATA:  Fall striking left forehead,  laceration.  Neck pain EXAM: CT HEAD WITHOUT CONTRAST CT CERVICAL SPINE WITHOUT CONTRAST TECHNIQUE: Multidetector CT imaging of the head and cervical spine was performed following the standard protocol without intravenous contrast. Multiplanar CT image reconstructions of the cervical spine were also generated. COMPARISON:  CT cervical spine 06/03/2020, CT head 06/03/2020, chest x-ray 06/03/2020 FINDINGS: CT HEAD FINDINGS Brain: Cerebral ventricle sizes are concordant with the degree of cerebral volume loss. Patchy and confluent areas of decreased attenuation are noted throughout the deep and periventricular white matter of the cerebral hemispheres bilaterally, compatible with chronic microvascular ischemic disease. No evidence of large-territorial acute infarction. No parenchymal hemorrhage. No mass lesion. No extra-axial collection. No mass effect or midline shift. No hydrocephalus. Basilar cisterns are patent. Vascular: No hyperdense vessel. Skull: No acute fracture or focal lesion. Sinuses/Orbits: Mucosal polyp within the left maxillary sinus. Otherwise the paranasal sinuses and mastoid air cells are clear. The orbits are unremarkable. Other: None. CT CERVICAL SPINE FINDINGS Alignment: Redemonstration of grade 1 anterolisthesis of the surgerized levels of C6 on C7. Similar grade 1 anterolisthesis of C4 on C5. Otherwise unremarkable. Skull base and vertebrae: Redemonstration of C6-C7 anterior fusion and interbody cage. Healed C6 fracture. No acute fracture. Redemonstration of a densely sclerotic and slightly irregular lesion of the left T3 lamina, pedicle, and transverse process. Soft tissues and spinal canal: No prevertebral fluid or swelling. No visible canal hematoma. Disc levels:  Maintained. Upper chest: Linear atelectasis versus scarring within the left apex. No pneumothorax. Other: Interval resolution of subcutaneus soft tissue emphysema. IMPRESSION: 1. No acute intracranial abnormality. 2. No acute  displaced fracture or traumatic listhesis of the cervical spine. 3. Indeterminate similar-appearing densely sclerotic lesion of the left T3 lamina, pedicle, and transverse process.  Finding could represent metastasis in a patient with prostate cancer. These results were called by telephone at the time of interpretation on 08/02/2020 at 4:05 pm to provider PA [facility] , who verbally acknowledged these results. Electronically Signed   By: Iven Finn M.D.   On: 08/02/2020 16:07   DG Hip Unilat W or Wo Pelvis 2-3 Views Left  Result Date: 08/02/2020 CLINICAL DATA:  Status post fall. EXAM: DG HIP (WITH OR WITHOUT PELVIS) 2-3V LEFT COMPARISON:  None. FINDINGS: An acute nondisplaced fracture is seen extending through the inter trochanteric region of the proximal left femur. There is no evidence of dislocation. Mild degenerative changes seen involving the left hip, in the form of joint space narrowing and acetabular sclerosis. IMPRESSION: Acute nondisplaced intertrochanteric fracture of the proximal left femur. Electronically Signed   By: Virgina Norfolk M.D.   On: 08/02/2020 15:27   DG Hip Unilat W or Wo Pelvis 2-3 Views Right  Result Date: 08/02/2020 CLINICAL DATA:  Status post fall. EXAM: DG HIP (WITH OR WITHOUT PELVIS) 2-3V RIGHT COMPARISON:  None. FINDINGS: An acute nondisplaced fracture is seen extending through the inter trochanteric region of the proximal left femur. There is no evidence of dislocation. Mild degenerative changes seen involving both hips, in the form of joint space narrowing and acetabular sclerosis. IMPRESSION: Acute nondisplaced intertrochanteric fracture of the proximal left femur. Electronically Signed   By: Virgina Norfolk M.D.   On: 08/02/2020 15:28   DG Femur Min 2 Views Left  Result Date: 08/02/2020 CLINICAL DATA:  Status post fall. EXAM: LEFT FEMUR 2 VIEWS COMPARISON:  None. FINDINGS: An acute nondisplaced fracture is seen extending through the inter  trochanteric region of the proximal left femur. There is no evidence of dislocation. Soft tissues are unremarkable. IMPRESSION: Acute nondisplaced intertrochanteric fracture of the proximal left femur. Electronically Signed   By: Virgina Norfolk M.D.   On: 08/02/2020 15:25    Review of Systems  Unable to perform ROS: Dementia   Blood pressure 127/77, pulse 91, temperature 98 F (36.7 C), temperature source Oral, resp. rate 17, height 5\' 6"  (1.676 m), weight 56.7 kg, SpO2 100 %. Physical Exam Constitutional:      General: He is not in acute distress. HENT:     Mouth/Throat:     Mouth: Mucous membranes are moist.  Eyes:     Extraocular Movements: Extraocular movements intact.  Cardiovascular:     Rate and Rhythm: Normal rate.  Pulmonary:     Effort: Pulmonary effort is normal.  Abdominal:     General: Abdomen is flat.  Musculoskeletal:     Cervical back: Neck supple.     Comments: Left lower extremity shortened and rotated.  Skin:    General: Skin is warm.  Neurological:     Mental Status: He is disoriented.     Assessment/Plan: Patient has a left intertrochanteric hip fracture.  I had a lengthy discussion with the patient's family, specifically his son Mitchell Stewart who is the power of attorney.  Tami would like to proceed with comfort care measures only at this time.  We discussed the role of surgical intervention for pain relief, early weightbearing and more predictable fracture healing however they would not like to proceed with surgery.  For now the patient may mobilize as tolerated.  Certainly if they change their mind orthopedics may be reconsulted for fracture care.  Orthopedics will sign off for now.  Mitchell Stewart 08/03/2020, 9:13 AM

## 2020-08-03 NOTE — Evaluation (Signed)
Clinical/Bedside Swallow Evaluation Patient Details  Name: Mitchell Stewart MRN: [id number] Date of Birth: 1940/08/26  Today's Date: 08/03/2020 Time: SLP Start Time (ACUTE ONLY): 1211 SLP Stop Time (ACUTE ONLY): 1221 SLP Time Calculation (min) (ACUTE ONLY): 10 min  Past Medical History:  Past Medical History:  Diagnosis Date  . Dementia ([place])   . Depression   . Diabetes mellitus without complication ([place])   . Erectile dysfunction   . Hard of hearing   . History of diverticulitis   . History of kidney stones   . Hyperlipidemia   . Osteoporosis   . Prostate cancer [facility])    Past Surgical History:  Past Surgical History:  Procedure Laterality Date  . ANTERIOR CERVICAL DECOMP/DISCECTOMY FUSION N/A 06/03/2020   Procedure: ANTERIOR CERVICAL DECOMPRESSION/DISCECTOMY FUSION 1 LEVEL C6/7;  Surgeon: Deetta Perla, MD;  Location: [facility] ORS;  Service: Neurosurgery;  Laterality: N/A;   HPI:  Pt is a 79 yo male admitted s/p fall at SNF with L hip fx. Pt has had several recent falls with admission in October for ACDF. Family has chosen not to pursue surgical intervention. PMH includes: dementia, depression, DM, hard of hearing, HLD, osteoporosis, prostate ca   Assessment / Plan / Recommendation Clinical Impression  Pt was seen for a swallowing evaluation, with RN voicing concerns about pt's ability to manage regular solids. He unfortunately could not be sufficiently aroused despite Max multimodal cueing by SLP. RN also called to room to assess, but pt will only transiently open his eyes. He did not orally accept any PO trials or show awareness of their presence. Given pt's h/o dementia and his limited dentition, it is very possible that he may have difficulty with mastication of solid foods; however, if family is opting for comfort, they may wish to still leave him on this more liberalized diet. Per RN, plan is to d/c back to his facility today where they will better know his baseline diet which may or  may not have been modified. Will leave current diet in place but encouraged RN to select pureed items from his tray if he wakes up more for lunch. SLP to sign off acutely given impending d/c but please reorder if needed. SLP Visit Diagnosis: Dysphagia, unspecified (R13.10)    Aspiration Risk  Other (comment) (limited assessment)    Diet Recommendation Regular;Thin liquid (select pureed foods PRN)   Liquid Administration via: Cup;Straw Medication Administration: Crushed with puree Supervision: Staff to assist with self feeding;Full supervision/cueing for compensatory strategies Compensations: Minimize environmental distractions;Slow rate;Small sips/bites Postural Changes: Seated upright at 90 degrees    Other  Recommendations Oral Care Recommendations: Oral care BID Other Recommendations: Have oral suction available   Follow up Recommendations Skilled Nursing facility      Frequency and Duration            Prognosis Prognosis for Safe Diet Advancement: Fair Barriers to Reach Goals: Cognitive deficits      Swallow Study   General HPI: Pt is a 79 yo male admitted s/p fall at SNF with L hip fx. Pt has had several recent falls with admission in October for ACDF. Family has chosen not to pursue surgical intervention. PMH includes: dementia, depression, DM, hard of hearing, HLD, osteoporosis, prostate ca Type of Study: Bedside Swallow Evaluation Previous Swallow Assessment: none in chart Diet Prior to this Study: Regular;Thin liquids Temperature Spikes Noted: No Respiratory Status: Room air History of Recent Intubation: No Behavior/Cognition: Lethargic/Drowsy;Doesn't follow directions Oral Cavity Assessment: Dry Oral Care Completed  by SLP: No Patient Positioning: Upright in bed Baseline Vocal Quality: Low vocal intensity Volitional Cough: Cognitively unable to elicit Volitional Swallow: Unable to elicit    Oral/Motor/Sensory Function     Ice Chips Ice chips: Not tested   Thin  Liquid Thin Liquid: Not tested    Nectar Thick Nectar Thick Liquid: Not tested   Honey Thick Honey Thick Liquid: Not tested   Puree Puree: Not tested   Solid     Solid: Not tested      Osie Bond., M.A. [place] Pager [phone] Office [phone]  08/03/2020,1:39 PM

## 2020-09-24 DEATH — deceased

## 2021-02-13 IMAGING — DX DG FEMUR 2+V*L*
4 series · 4 of 4 positions shown · non-contrast
Comparison: None.

CLINICAL DATA: Status post fall.

EXAM:
LEFT FEMUR 2 VIEWS

[femur ap (1 of 2)]
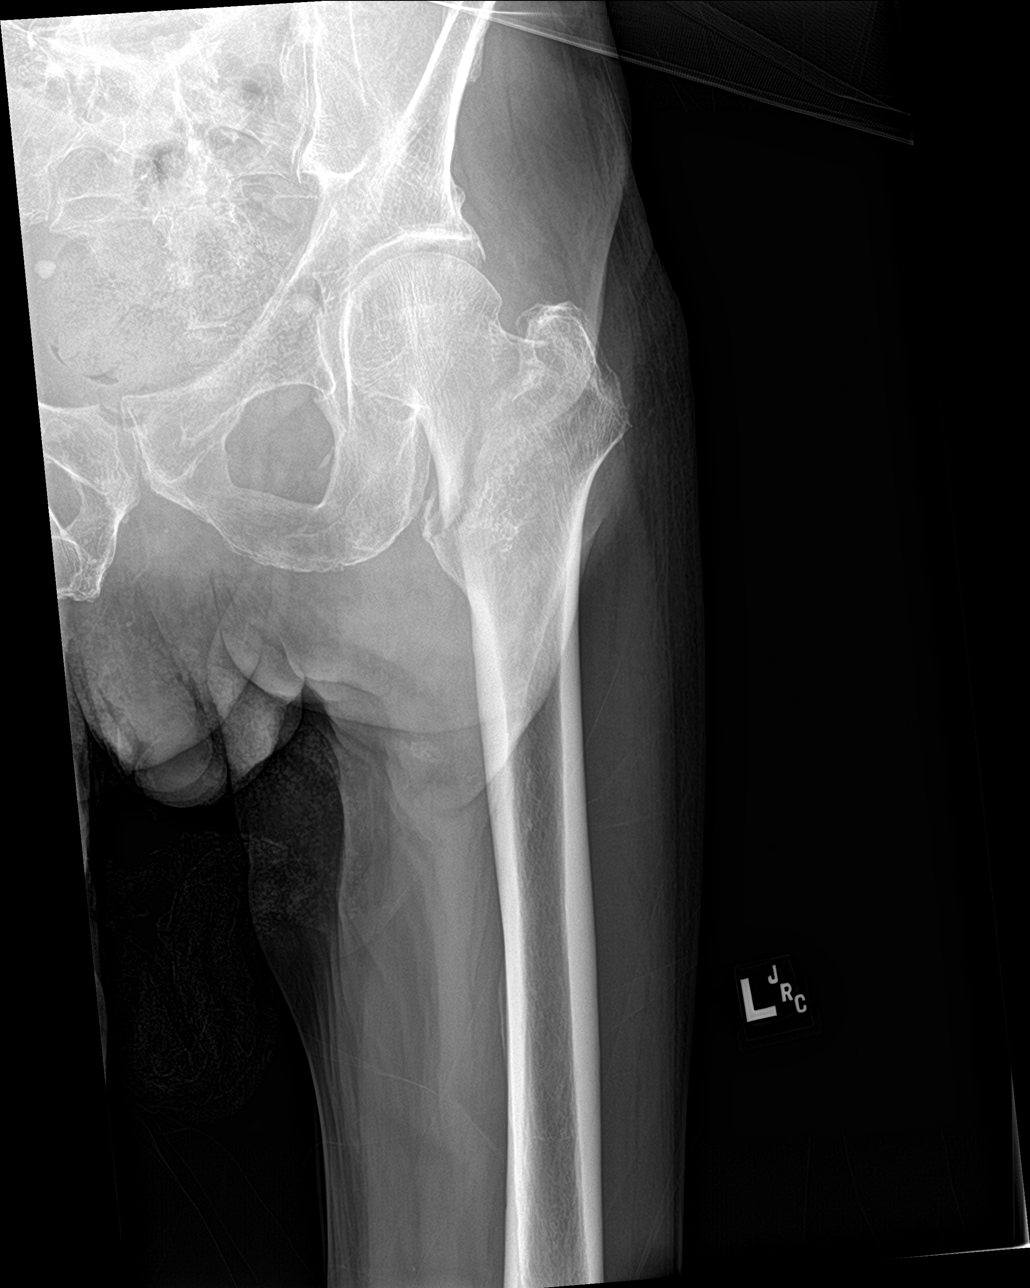

[femur ap (2 of 2)]
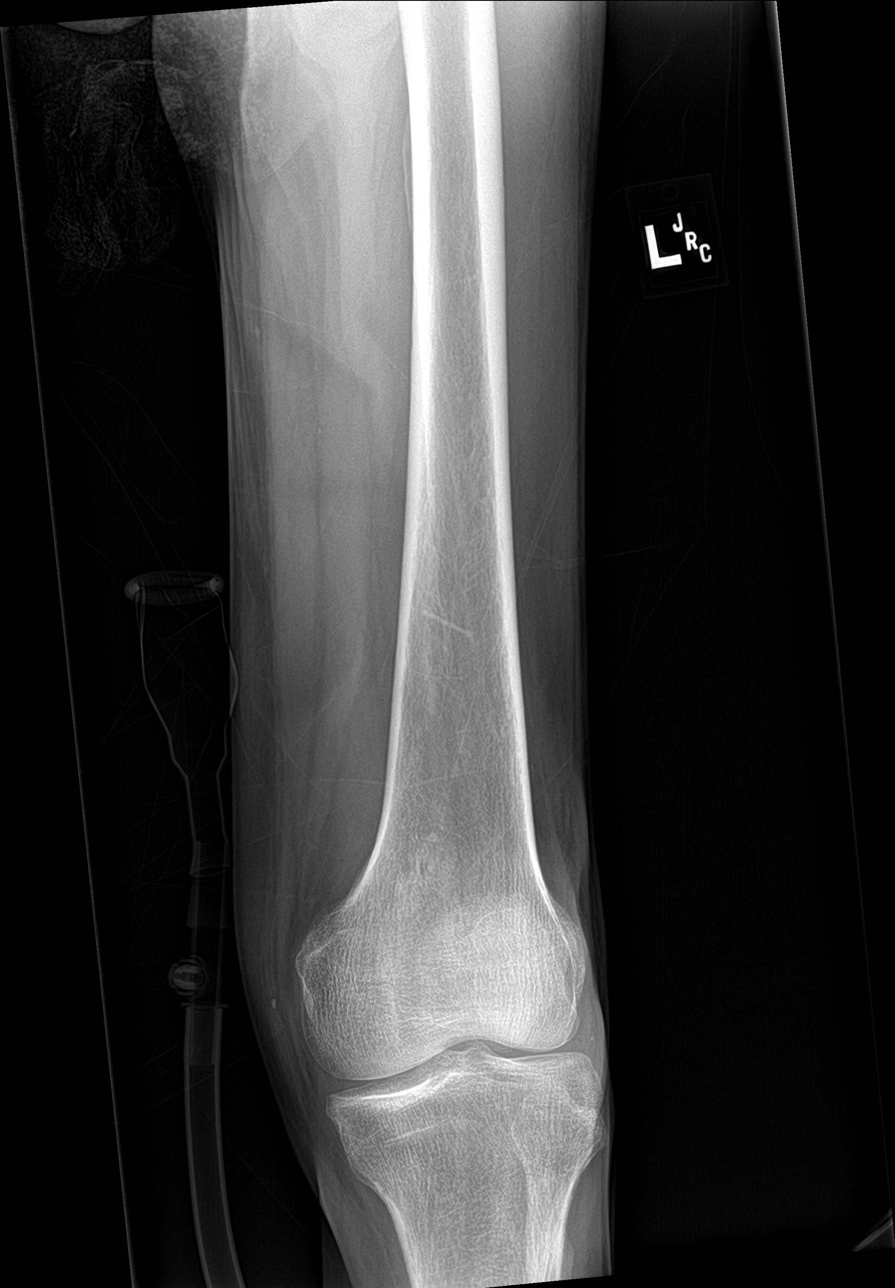

[femur lat (1 of 2)]
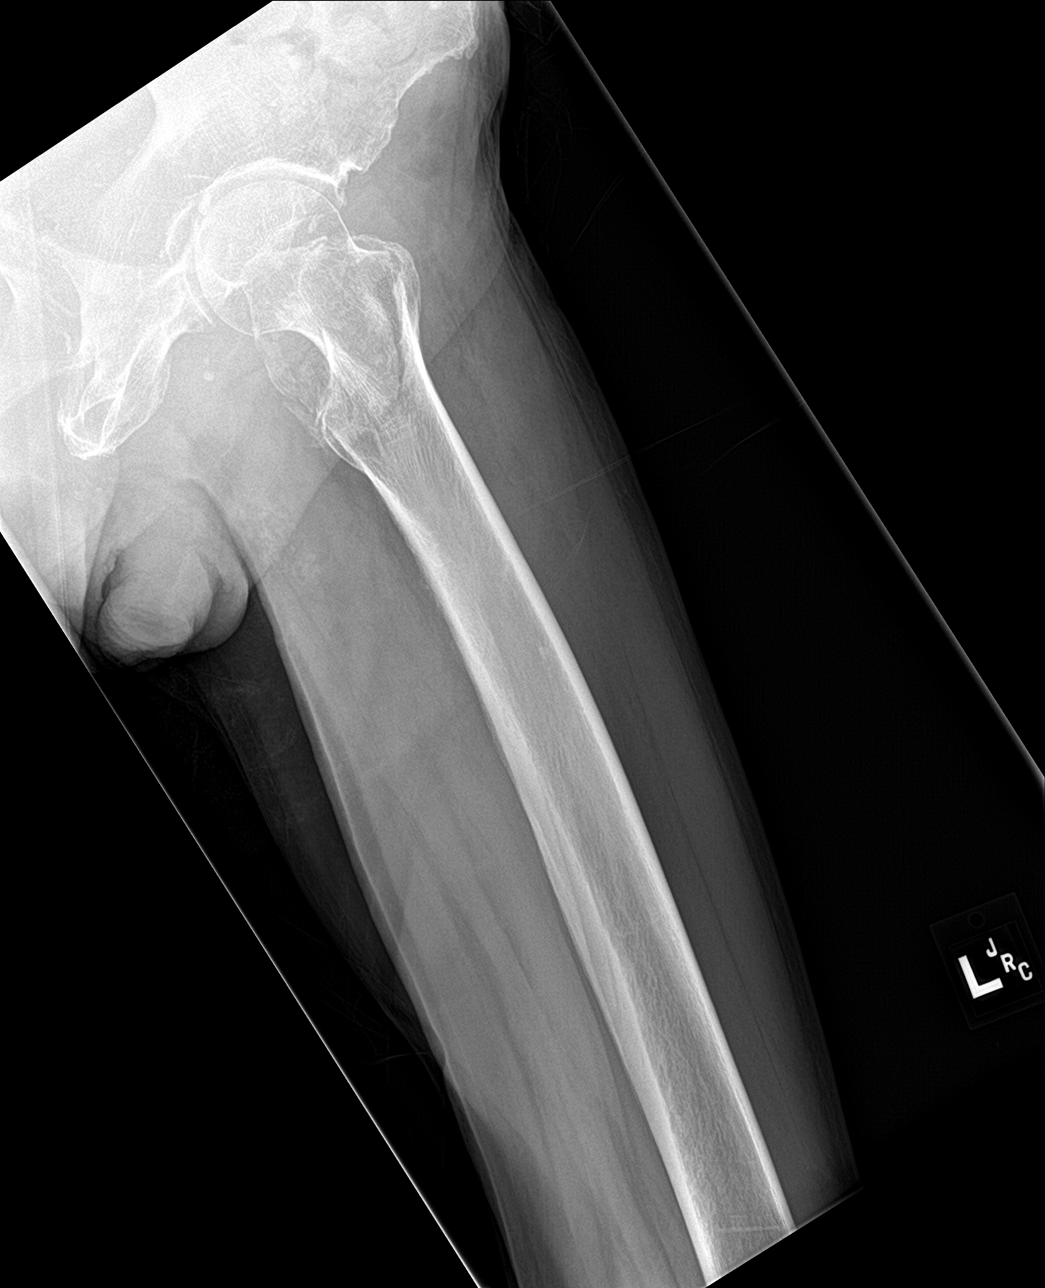

[femur lat (2 of 2)]
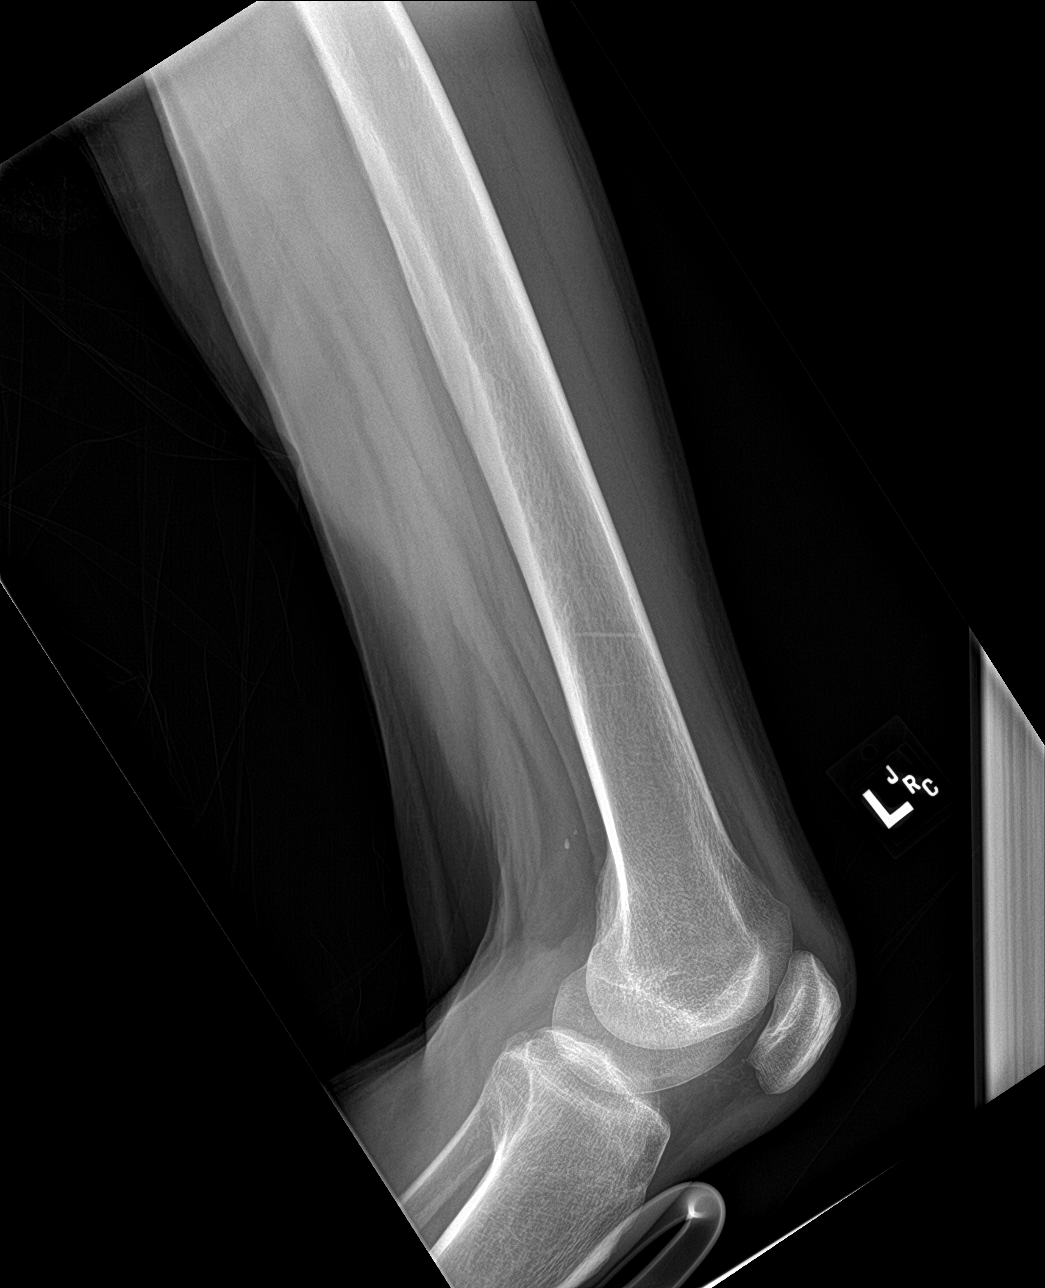

[4 of 4 positions shown; findings below may reference images not displayed]

FINDINGS: An acute nondisplaced fracture is seen extending through the inter
trochanteric region of the proximal left femur. There is no evidence
of dislocation. Soft tissues are unremarkable.
IMPRESSION: Acute nondisplaced intertrochanteric fracture of the proximal left
femur.
